# Patient Record
Sex: Female | Born: 1965
Health system: Southern US, Community
[De-identification: ages and names within clinical notes are randomized; demographics above are authoritative.]

## PROBLEM LIST (undated history)

## (undated) DIAGNOSIS — I824Y9 Acute embolism and thrombosis of unspecified deep veins of unspecified proximal lower extremity: Secondary | ICD-10-CM

## (undated) DIAGNOSIS — Z9109 Other allergy status, other than to drugs and biological substances: Secondary | ICD-10-CM

## (undated) DIAGNOSIS — R748 Abnormal levels of other serum enzymes: Secondary | ICD-10-CM

## (undated) DIAGNOSIS — J302 Other seasonal allergic rhinitis: Secondary | ICD-10-CM

## (undated) DIAGNOSIS — M1990 Unspecified osteoarthritis, unspecified site: Secondary | ICD-10-CM

## (undated) DIAGNOSIS — J4540 Moderate persistent asthma, uncomplicated: Secondary | ICD-10-CM

## (undated) DIAGNOSIS — I10 Essential (primary) hypertension: Secondary | ICD-10-CM

## (undated) DIAGNOSIS — E7800 Pure hypercholesterolemia, unspecified: Secondary | ICD-10-CM

## (undated) DIAGNOSIS — N950 Postmenopausal bleeding: Secondary | ICD-10-CM

## (undated) DIAGNOSIS — R740 Nonspecific elevation of levels of transaminase and lactic acid dehydrogenase [LDH]: Secondary | ICD-10-CM

## (undated) DIAGNOSIS — N840 Polyp of corpus uteri: Secondary | ICD-10-CM

## (undated) DIAGNOSIS — Z973 Presence of spectacles and contact lenses: Secondary | ICD-10-CM

## (undated) DIAGNOSIS — K219 Gastro-esophageal reflux disease without esophagitis: Secondary | ICD-10-CM

## (undated) DIAGNOSIS — N9489 Other specified conditions associated with female genital organs and menstrual cycle: Secondary | ICD-10-CM

## (undated) DIAGNOSIS — J309 Allergic rhinitis, unspecified: Secondary | ICD-10-CM

## (undated) DIAGNOSIS — E063 Autoimmune thyroiditis: Secondary | ICD-10-CM

## (undated) DIAGNOSIS — R7303 Prediabetes: Secondary | ICD-10-CM

## (undated) DIAGNOSIS — J4530 Mild persistent asthma, uncomplicated: Secondary | ICD-10-CM

## (undated) DIAGNOSIS — L309 Dermatitis, unspecified: Secondary | ICD-10-CM

## (undated) DIAGNOSIS — E119 Type 2 diabetes mellitus without complications: Secondary | ICD-10-CM

## (undated) DIAGNOSIS — M722 Plantar fascial fibromatosis: Secondary | ICD-10-CM

## (undated) HISTORY — DX: Nonspecific elevation of levels of transaminase and lactic acid dehydrogenase (ldh): R74.0

## (undated) HISTORY — DX: Autoimmune thyroiditis: E06.3

## (undated) HISTORY — DX: Acute embolism and thrombosis of unspecified deep veins of unspecified proximal lower extremity: I82.4Y9

## (undated) HISTORY — DX: Pure hypercholesterolemia, unspecified: E78.00

## (undated) HISTORY — DX: Plantar fascial fibromatosis: M72.2

## (undated) HISTORY — DX: Abnormal levels of other serum enzymes: R74.8

## (undated) HISTORY — PX: HAND SURGERY: SHX662

## (undated) HISTORY — PX: KNEE ARTHROSCOPY: SUR90

## (undated) HISTORY — DX: Essential (primary) hypertension: I10

---

## 1989-06-14 HISTORY — PX: LAPAROSCOPY: SHX197

## 1993-06-14 DIAGNOSIS — Z86718 Personal history of other venous thrombosis and embolism: Secondary | ICD-10-CM

## 1993-06-14 DIAGNOSIS — I824Y9 Acute embolism and thrombosis of unspecified deep veins of unspecified proximal lower extremity: Secondary | ICD-10-CM

## 1993-06-14 HISTORY — DX: Personal history of other venous thrombosis and embolism: Z86.718

## 1993-06-14 HISTORY — DX: Acute embolism and thrombosis of unspecified deep veins of unspecified proximal lower extremity: I82.4Y9

## 1999-10-07 ENCOUNTER — Inpatient Hospital Stay (HOSPITAL_COMMUNITY): Admission: AD | Admit: 1999-10-07 | Discharge: 1999-10-09 | Payer: Self-pay | Admitting: Gynecology

## 2001-04-07 ENCOUNTER — Encounter (INDEPENDENT_AMBULATORY_CARE_PROVIDER_SITE_OTHER): Payer: Self-pay | Admitting: Specialist

## 2001-04-07 ENCOUNTER — Inpatient Hospital Stay (HOSPITAL_COMMUNITY): Admission: AD | Admit: 2001-04-07 | Discharge: 2001-04-09 | Payer: Self-pay | Admitting: Gynecology

## 2001-05-17 ENCOUNTER — Emergency Department (HOSPITAL_COMMUNITY): Admission: EM | Admit: 2001-05-17 | Discharge: 2001-05-18 | Payer: Self-pay | Admitting: Emergency Medicine

## 2001-05-17 ENCOUNTER — Encounter: Payer: Self-pay | Admitting: Emergency Medicine

## 2001-05-18 ENCOUNTER — Encounter: Payer: Self-pay | Admitting: Emergency Medicine

## 2001-05-19 ENCOUNTER — Other Ambulatory Visit: Admission: RE | Admit: 2001-05-19 | Discharge: 2001-05-19 | Payer: Self-pay | Admitting: Gynecology

## 2001-10-04 ENCOUNTER — Other Ambulatory Visit: Admission: RE | Admit: 2001-10-04 | Discharge: 2001-10-04 | Payer: Self-pay | Admitting: Gynecology

## 2002-06-14 HISTORY — PX: SHOULDER ARTHROSCOPY: SHX128

## 2002-08-06 ENCOUNTER — Encounter: Payer: Self-pay | Admitting: Emergency Medicine

## 2002-08-06 ENCOUNTER — Emergency Department (HOSPITAL_COMMUNITY): Admission: EM | Admit: 2002-08-06 | Discharge: 2002-08-07 | Payer: Self-pay | Admitting: Emergency Medicine

## 2002-08-07 ENCOUNTER — Encounter: Payer: Self-pay | Admitting: Emergency Medicine

## 2003-02-19 ENCOUNTER — Other Ambulatory Visit: Admission: RE | Admit: 2003-02-19 | Discharge: 2003-02-19 | Payer: Self-pay | Admitting: Obstetrics and Gynecology

## 2004-03-23 ENCOUNTER — Other Ambulatory Visit: Admission: RE | Admit: 2004-03-23 | Discharge: 2004-03-23 | Payer: Self-pay | Admitting: Obstetrics and Gynecology

## 2004-06-14 HISTORY — PX: HAND SURGERY: SHX662

## 2005-04-29 ENCOUNTER — Ambulatory Visit (HOSPITAL_BASED_OUTPATIENT_CLINIC_OR_DEPARTMENT_OTHER): Admission: RE | Admit: 2005-04-29 | Discharge: 2005-04-29 | Payer: Self-pay | Admitting: Orthopedic Surgery

## 2005-04-29 ENCOUNTER — Encounter (INDEPENDENT_AMBULATORY_CARE_PROVIDER_SITE_OTHER): Payer: Self-pay | Admitting: *Deleted

## 2005-04-29 ENCOUNTER — Ambulatory Visit (HOSPITAL_COMMUNITY): Admission: RE | Admit: 2005-04-29 | Discharge: 2005-04-29 | Payer: Self-pay | Admitting: Orthopedic Surgery

## 2005-05-10 ENCOUNTER — Ambulatory Visit: Payer: Self-pay | Admitting: Infectious Diseases

## 2005-05-11 ENCOUNTER — Ambulatory Visit: Payer: Self-pay | Admitting: Infectious Diseases

## 2005-05-25 ENCOUNTER — Other Ambulatory Visit: Admission: RE | Admit: 2005-05-25 | Discharge: 2005-05-25 | Payer: Self-pay | Admitting: Obstetrics and Gynecology

## 2005-06-02 ENCOUNTER — Ambulatory Visit: Payer: Self-pay | Admitting: Infectious Diseases

## 2005-06-14 DIAGNOSIS — M722 Plantar fascial fibromatosis: Secondary | ICD-10-CM

## 2005-06-14 HISTORY — DX: Plantar fascial fibromatosis: M72.2

## 2005-07-29 ENCOUNTER — Ambulatory Visit: Payer: Self-pay | Admitting: Infectious Diseases

## 2005-10-15 ENCOUNTER — Ambulatory Visit: Payer: Self-pay | Admitting: Infectious Diseases

## 2006-05-27 ENCOUNTER — Other Ambulatory Visit: Admission: RE | Admit: 2006-05-27 | Discharge: 2006-05-27 | Payer: Self-pay | Admitting: Obstetrics and Gynecology

## 2007-07-12 ENCOUNTER — Other Ambulatory Visit: Admission: RE | Admit: 2007-07-12 | Discharge: 2007-07-12 | Payer: Self-pay | Admitting: Obstetrics and Gynecology

## 2008-08-26 ENCOUNTER — Other Ambulatory Visit: Admission: RE | Admit: 2008-08-26 | Discharge: 2008-08-26 | Payer: Self-pay | Admitting: Obstetrics and Gynecology

## 2010-03-22 ENCOUNTER — Emergency Department (HOSPITAL_BASED_OUTPATIENT_CLINIC_OR_DEPARTMENT_OTHER): Admission: EM | Admit: 2010-03-22 | Discharge: 2010-03-23 | Payer: Self-pay | Admitting: Emergency Medicine

## 2010-03-22 ENCOUNTER — Ambulatory Visit: Payer: Self-pay | Admitting: Diagnostic Radiology

## 2010-04-14 HISTORY — PX: KNEE ARTHROSCOPY: SUR90

## 2010-04-27 ENCOUNTER — Ambulatory Visit (HOSPITAL_BASED_OUTPATIENT_CLINIC_OR_DEPARTMENT_OTHER): Admission: RE | Admit: 2010-04-27 | Discharge: 2010-04-27 | Payer: Self-pay | Admitting: Specialist

## 2010-08-25 LAB — POCT HEMOGLOBIN-HEMACUE: Hemoglobin: 14 g/dL (ref 12.0–15.0)

## 2010-10-30 NOTE — Op Note (Signed)
NAMEBRIHANA, Monica Good             ACCOUNT NO.:  [id number]   MEDICAL RECORD NO.:  [id number]          PATIENT TYPE:  AMB   LOCATION:  [place]                          FACILITY:  [facility]   PHYSICIAN:  Youlanda Mighty. Sypher, M.D. DATE OF BIRTH:  1965/08/20   DATE OF PROCEDURE:  04/29/2005  DATE OF DISCHARGE:                                 OPERATIVE REPORT   PREOPERATIVE DIAGNOSIS:  Probable granulomatous lymphangitis, right dorsal  hand, wrist and forearm, due to presumed to fungal or atypical mycobacterial  infection, with development of dorsal forearm abscess.   POSTOPERATIVE DIAGNOSIS:  Probable granulomatous lymphangitis, right dorsal  hand, wrist and forearm, due to presumed to fungal or atypical mycobacterial  infection, with development of dorsal forearm abscess.   OPERATION:  Resection of granulomatous lymphangitis and incision and  drainage of dorsal forearm abscess with aerobic and anaerobic cultures and  tissue culture for acid-fast bacilli and fungal evaluation and possible  Sporotrichosis culture.   OPERATIONS:  Youlanda Mighty. Sypher, M.D.   ASSISTANT:  Herbie Baltimore Dasnoit PA-C   ANESTHESIA:  His axillary block, supervising anesthesiologist is Dr.  Annye Asa.   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  Carol Ada for evaluation and management of an atypical right arm pain  and swelling phenomenon.   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   During the past several weeks she had noted swelling and a nodular  appearance and feel of the dorsum of her hand and wrist region.  She  developed am area of rubor on the dorsal aspect of her forearm consistent  with a possible abscess.  On palpation this abscess was cold.  She has seen  a number of physicians and was referred for an upper extremity orthopedic  consult.   Clinical examination suggested a possible granulomatous  lymphangitis with  abscess formation.   I contacted Alison Murray, M.D., of the infectious disease service and  discussed the current management strategies for sporotrichosis or atypical  mycobacteria.   We concluded that a tissue biopsy was mandatory to obtain an accurate  organism identification and sensitivities for proper chemotherapy.   After informed consent, Monica Good is brought to the operating at this  time anticipating an excisional biopsy of her atypical lymphangitis and an  incision and drainage of her obvious abscess for culture, sensitivities,  tissue biopsy and tissue for smears to try to identify her offending  organism.   After informed consent, she is brought to the operating room at this time.   PROCEDURE:  Monica Good is brought to the operating room and placed in  supine position on the operating table.   Following an anesthesia consultation with Dr. Glennon Mac, she elected to have  regional anesthesia in the form of an axillary block.   Following completion of block, she had excellent anesthesia of the right  arm.  She was subsequently transferred to the operating room and placed in  supine position on the operating table.   Following light sedation, the right arm was  prepped with Betadine soap and  solution and sterilely draped.   A pneumatic tourniquet was applied to the proximal brachium, followed by  elevation and exsanguination of the limb with an Esmarch bandage.  The  arterial tourniquet was inflated to 240 mmHg.   The procedure commenced with a 3-cm incision directly over the palpably  enlarged lymphatic chain.   The subcutaneous tissues were carefully divided, revealing a rather unusual  seropurulent material and an obviously inflamed and granulomatous-appearing  lymphatic chain that looked not unlike a string of pearls, approximately 4-5  mm in diameter.   The lymphatic tissues over the dorsum of the ring finger metacarpal were   harvested over a distance of 3.5 cm.   The lymphatic chain was suture ligated distally to prevent a collection of a  lymphocele postoperatively.   The segment was removed and a portion placed in formalin for histopathologic  evaluation and the rest placed in saline and sent directly to the lab for  fungal and AFB identification and culture with sensitivities.   Attention was then directed to the dorsal forearm.   A small elliptical fragment of skin was harvested through a transverse  incision to use as a full-thickness skin biopsy, also to send for smear and  histologic evaluation in an effort to obtain an organism.   The abscess was drained by blunt dissection, followed by packing with  Iodoform gauze.   For aftercare Monica Good will be placed on doxycycline 100 mg p.o. b.i.d.  pending our smear evaluation.   Once we are provided adequate information to understand the nature of her  infection, we will speak with the infectious disease service and begin  proper chemotherapy.   The wounds were irrigated with sterile saline followed by repair of the hand  biopsy wound with intradermal 3-0 Prolene and steri-stripped.  A compressive  dressing was applied with volar plaster splint maintaining the wrist in 5  degrees of dorsiflexion.      Youlanda Mighty Sypher, M.D.  Electronically Signed     RVS/MEDQ  D:  04/29/2005  T:  04/30/2005  Job:  [id number]   cc:   Alison Murray, M.D.  Fax: [phone]   Judithann Sauger, M.D.  Fax: [phone]

## 2010-10-30 NOTE — Discharge Summary (Signed)
[place]  Patient:    Monica Good, Monica Good Visit Number: [id number] MRN: [id number]          Service Type: OBS Location: 910A 9142 01 Attending Physician:  Tereso Newcomer Dictated by:   Karna Christmas, [place] Date:  04/07/2001 Discharge Date: 04/09/2001                             Discharge Summary  DISCHARGE DIAGNOSES:          1. Intrauterine pregnancy at term.                               2. Prior history of deep vein thrombosis.  PROCEDURES:                   1. Induction of labor.                               2. Normal spontaneous vaginal delivery of viable                                  infant.  HISTORY OF PRESENT ILLNESS:   The patient is a 45 year old gravida 4, para 3-0-0-3, with an LMP of July 14, 2000, [facility] April 20, 2001.  Prenatal risk factors include a history of venous thrombosis while on OCPs.  The patient was anticoagulated with Lovenox with her previous pregnancy, and with the current pregnancy she was placed on 30 mg SQ q.d.  PRENATAL LABORATORY DATA:     Blood type O positive, antibody screen negative. RPR, HBsAg, HIV nonreactive.  Amniocentesis and amniotic AFP normal.  HOSPITAL COURSE:              The patient was admitted for induction of labor at 42 weeks on April 07, 2001.  She did stop her Lovenox on April 05, 2001.  She did progress rapidly to complete dilatation and delivered an Apgars of 9 and 48 female infant, weight 8 pounds 4 ounces, over an intact perineum. Postpartum course was normal.  She remained afebrile, had no difficulty voiding.  Was able to be discharged on her second postpartum day.  She did reinstitute the Lovenox 30 mg q.d. postpartum.  CBC:  Hematocrit 32.9, hemoglobin 11.6, WBC 12.8, platelets 202.  FOLLOW-UP:                    Follow up in six weeks.  MEDICATIONS:                  Continue with prenatal vitamins and iron. Motrin and Tylox for pain. Dictated by:    Karna Christmas, [facility] Attending Physician:  Tereso Newcomer DD:  04/21/01 TD:  04/23/01 Job: [id number]

## 2011-09-23 IMAGING — CR DG KNEE COMPLETE 4+V*L*
4 series · 4 of 4 positions shown · non-contrast
Comparison: None.

CLINICAL DATA: Knee pain for 2 weeks with difficulty walking.

LEFT KNEE - COMPLETE 4+ VIEW

[t knee ap left]
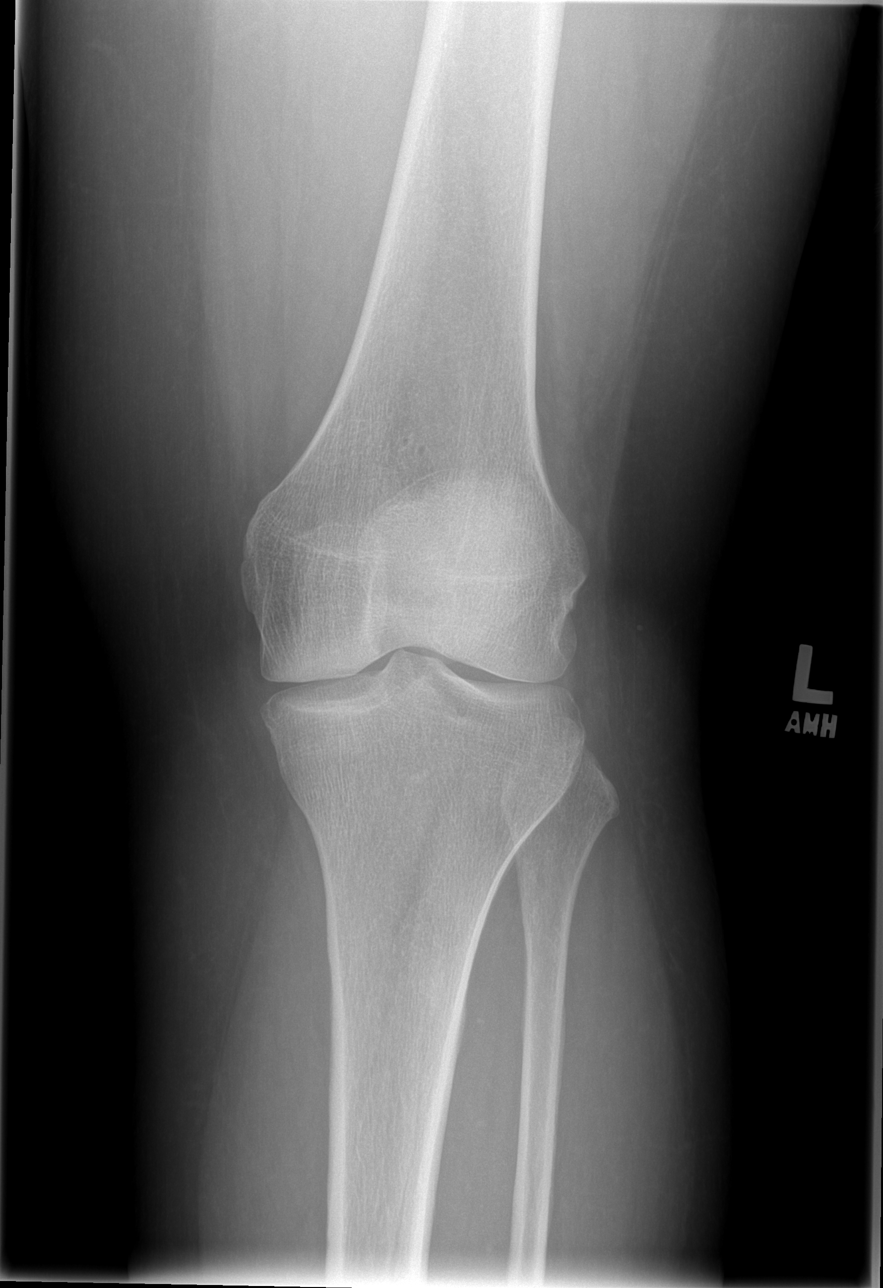

[t knee oblique left (1 of 2)]
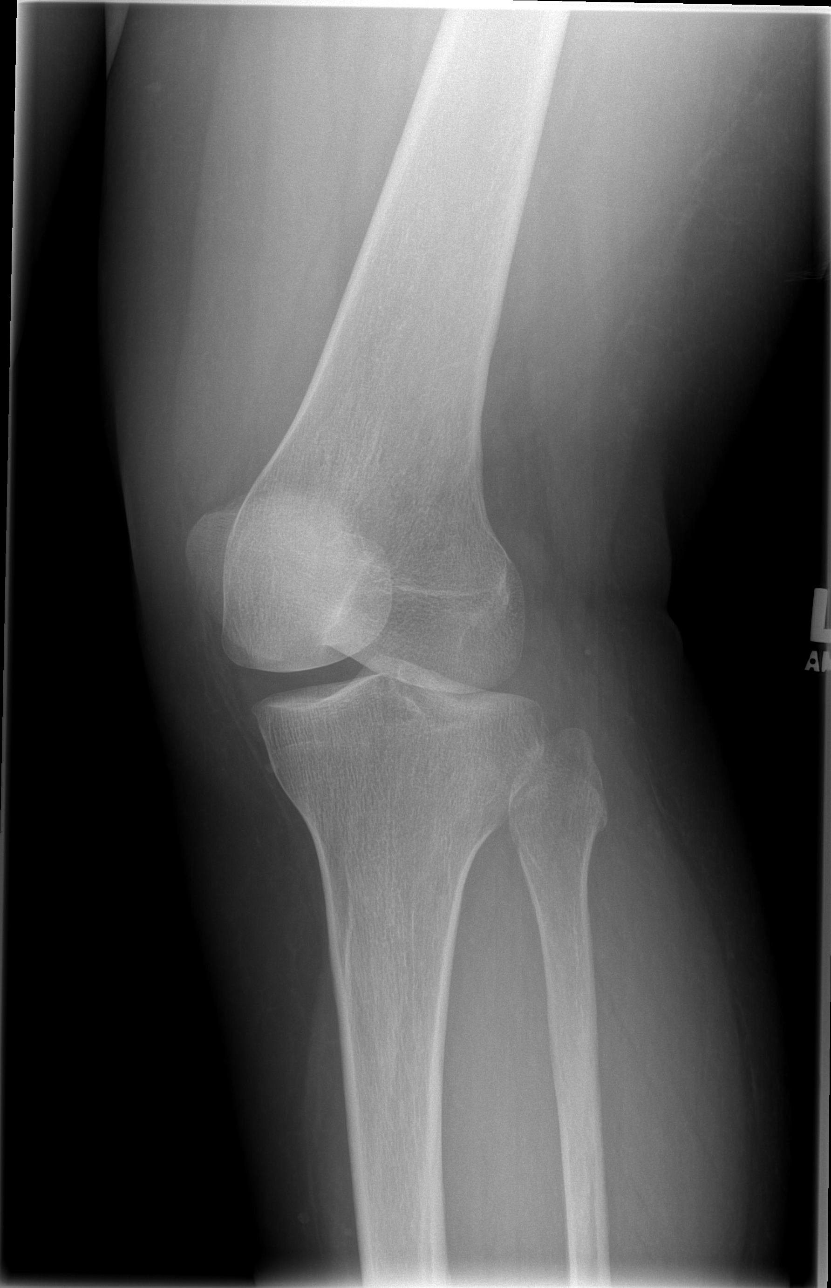

[t knee oblique left (2 of 2)]
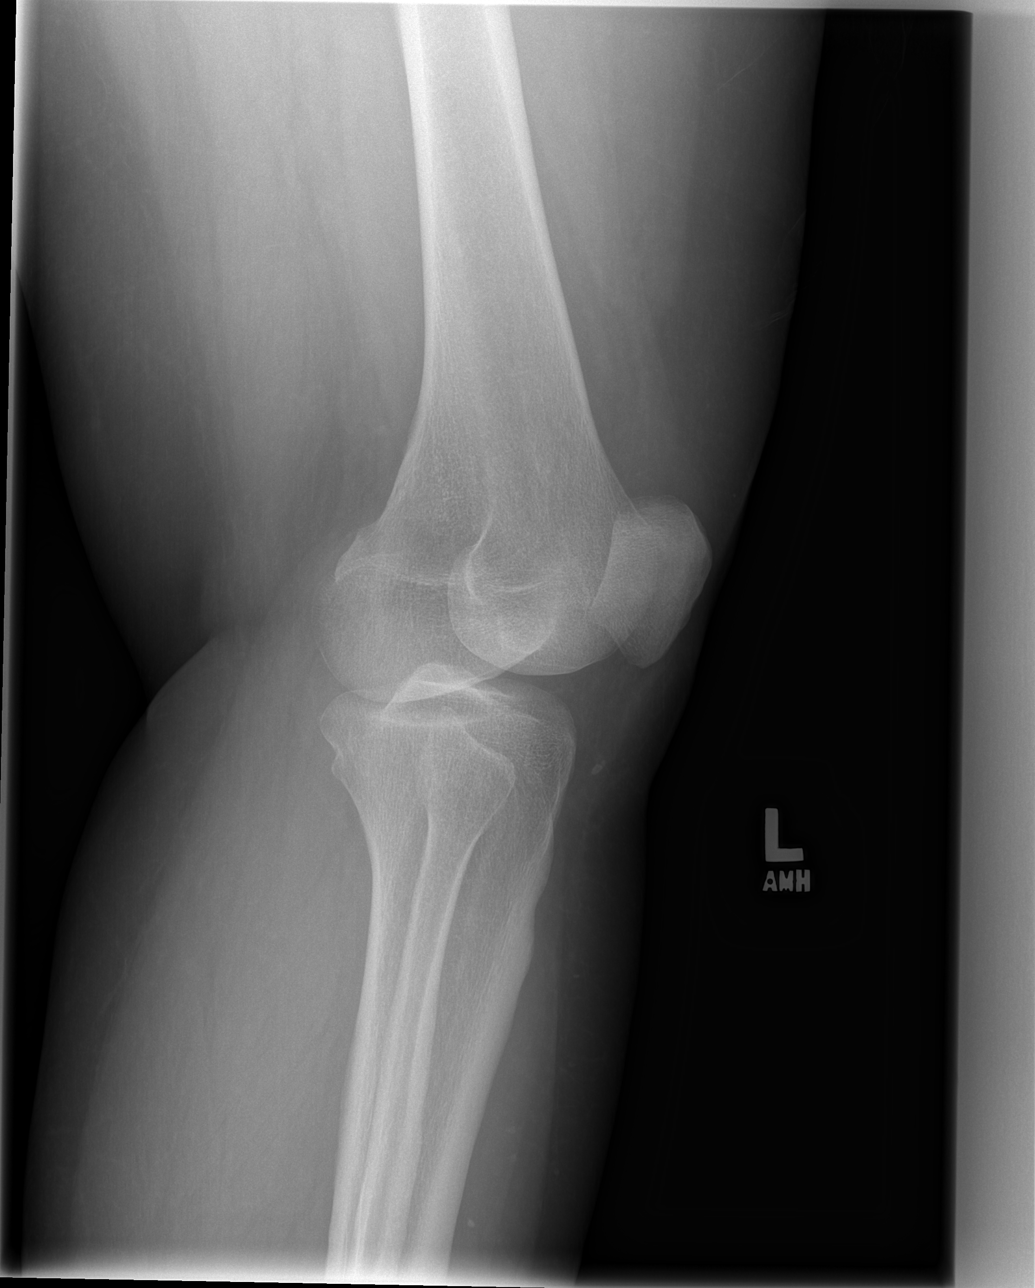

[t knee lat left]
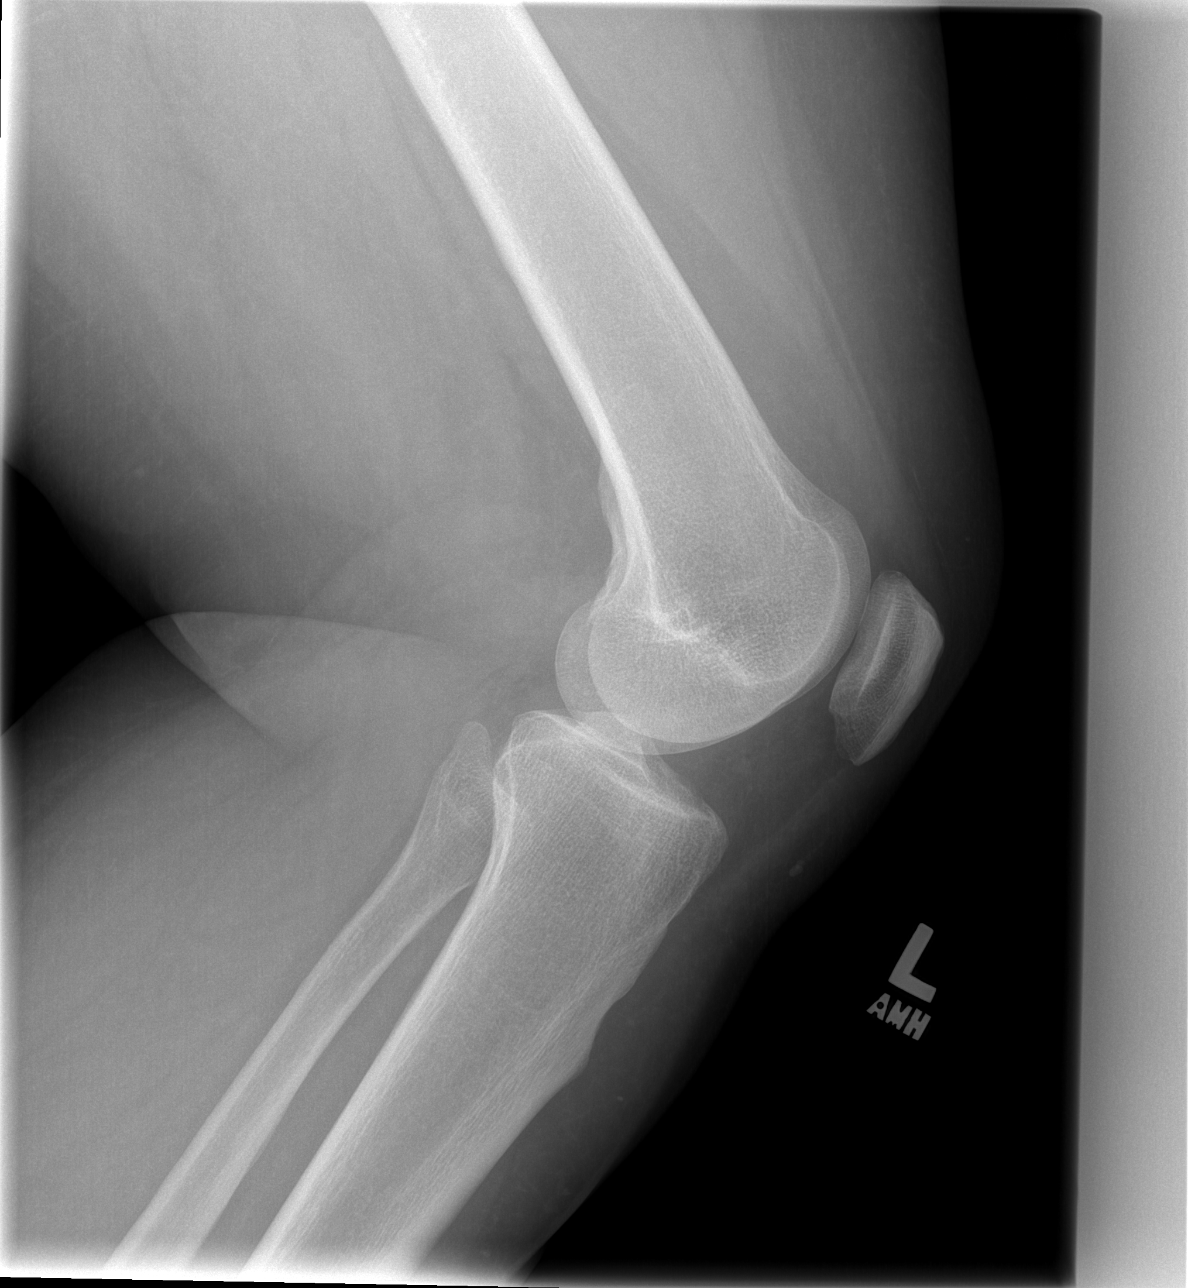

[4 of 4 positions shown; findings below may reference images not displayed]

FINDINGS: The mineralization and alignment are normal.  There is no
evidence of acute fracture or dislocation.  No soft tissue
abnormalities are identified. There is no significant knee joint
effusion.
IMPRESSION: No acute osseous findings.

## 2011-11-01 ENCOUNTER — Telehealth: Payer: Self-pay | Admitting: Hematology & Oncology

## 2011-11-01 NOTE — Telephone Encounter (Signed)
Talked with Amy at referring MD office. She is aware we need labs,scans and anything related to pt's care faxed to [place].

## 2011-11-03 ENCOUNTER — Telehealth: Payer: Self-pay | Admitting: Hematology & Oncology

## 2011-11-03 NOTE — Telephone Encounter (Signed)
Left pt message to call and schedule appointment

## 2011-11-05 ENCOUNTER — Telehealth: Payer: Self-pay | Admitting: Hematology & Oncology

## 2011-11-05 NOTE — Telephone Encounter (Signed)
Patient aware of 12-01-11

## 2011-12-01 ENCOUNTER — Ambulatory Visit (HOSPITAL_BASED_OUTPATIENT_CLINIC_OR_DEPARTMENT_OTHER): Payer: BC Managed Care – PPO | Admitting: Hematology & Oncology

## 2011-12-01 ENCOUNTER — Other Ambulatory Visit: Payer: BC Managed Care – PPO | Admitting: Lab

## 2011-12-01 ENCOUNTER — Ambulatory Visit: Payer: BC Managed Care – PPO

## 2011-12-01 VITALS — BP 148/80 | HR 70 | Temp 97.2°F | Ht 67.0 in | Wt 272.0 lb

## 2011-12-01 DIAGNOSIS — I82409 Acute embolism and thrombosis of unspecified deep veins of unspecified lower extremity: Secondary | ICD-10-CM

## 2011-12-01 DIAGNOSIS — Z86718 Personal history of other venous thrombosis and embolism: Secondary | ICD-10-CM

## 2011-12-01 LAB — CBC WITH DIFFERENTIAL (CANCER CENTER ONLY)
BASO#: 0 10*3/uL (ref 0.0–0.2)
BASO%: 0.6 % (ref 0.0–2.0)
EOS%: 4.4 % (ref 0.0–7.0)
Eosinophils Absolute: 0.3 10*3/uL (ref 0.0–0.5)
HCT: 39.5 % (ref 34.8–46.6)
HGB: 13.7 g/dL (ref 11.6–15.9)
LYMPH#: 1.8 10*3/uL (ref 0.9–3.3)
LYMPH%: 26.8 % (ref 14.0–48.0)
MCH: 31.1 pg (ref 26.0–34.0)
MCHC: 34.7 g/dL (ref 32.0–36.0)
MCV: 90 fL (ref 81–101)
MONO#: 0.7 10*3/uL (ref 0.1–0.9)
MONO%: 10.9 % (ref 0.0–13.0)
NEUT#: 3.9 10*3/uL (ref 1.5–6.5)
NEUT%: 57.3 % (ref 39.6–80.0)
Platelets: 265 10*3/uL (ref 145–400)
RBC: 4.41 10*6/uL (ref 3.70–5.32)
RDW: 12.3 % (ref 11.1–15.7)
WBC: 6.8 10*3/uL (ref 3.9–10.0)

## 2011-12-01 LAB — CHCC SATELLITE - SMEAR

## 2011-12-01 NOTE — Progress Notes (Signed)
CC:   Cynthia P. Romine, M.D.  DIAGNOSIS: 1. History of deep venous thrombosis of the left thigh. 2. History of miscarriages.  HISTORY OF PRESENT ILLNESS:  Monica Good is a very charming 46 year old [place] female.  She and her husband moved to the [place] in 1995. She subsequently had a had DVT after moving here.  At that time, she was on oral contraceptives.  This apparently was a [place] "formulation" that had been causing troubles and subsequent losses in [place].  She was placed on anticoagulation for 1 year.  Of note, she had 3 pregnancies after her DVT.  She was on heparin/low molecular weight heparin for each pregnancy.  She got through each pregnancy without difficulties.  Her 1st pregnancy was actually in 1992.  Prior to this, she had had miscarriages.  She said that she had "blocked tubes."  These seem to be preventing her from carrying a child.  She did, however, have 4 children.  She has 3 daughters and 1 son.  She is concerned about any familial thrombophilic state such that her daughters should not be on oral contraceptives.  She does not smoke.  She has no diabetes.  She tries to stay active.  She says that she does not have any issues with her left leg.  There is no swelling.  She has occasional pain in the upper inner thigh of the left leg.  This, however, is infrequent.  She has had no weight loss or weight gain.  There has been no change in bowel or bladder habits.  She has had no cough.  She has had no headache or blurred vision.  She wanted to make sure that there was no risk of her having a thrombophilic state that she could pass on to her children.  As such, Dr. Joan Flores kindly referred her to the [facility] for an evaluation.  PAST MEDICAL HISTORY: 1. Remarkable for a history of asthma. 2. Left knee surgery for torn meniscus. 3. Right shoulder surgery.  ALLERGIES:  To no medications.  MEDICATIONS: 1. Aspirin 81 mg p.o.  daily. 2. Mirena IUD. 3. Singulair 10 mg p.o. q.h.s. 4. Proventil inhaler 2 puffs q.6 hours p.r.n.  SOCIAL HISTORY:  Negative for tobacco use.  She has rare alcohol use. She works for [employer].  She has no obvious occupational exposures.  FAMILY HISTORY:  Negative for any history of blood clot in the family. There is no history of malignancy in the family.  REVIEW OF SYSTEMS:  As stated in history of present illness.  No additional findings noted on a 12-system review.  PHYSICAL EXAMINATION:  This is a well-developed, well-nourished Caucasian female in no obvious distress.  Vital signs:  Temperature of 97.2, pulse 70, respiratory rate 20, blood pressure 148/80.  Weight is 272.  Head and neck:  Shows a normocephalic, atraumatic skull.  There are no ocular or oral lesions.  There are no palpable cervical or supraclavicular lymph nodes.  Lungs:  Clear to percussion and auscultation bilaterally.  Cardiac:  Regular rate and rhythm with a normal S1 and S2.  There are no murmurs, rubs or bruits.  Abdomen:  Soft with Good bowel sounds.  There is no palpable abdominal mass.  There is no fluid wave.  There is no palpable hepatosplenomegaly.  Back:  No tenderness over the spine, ribs, or hips.  Extremities:  Shows no clubbing, cyanosis or edema.  She has laparoscopy scars in the left knee.  She has a  negative Homans sign bilaterally.  She has no palpable venous cord in her lower legs.  She has Good range of motion of her joints.  Skin:  Shows no rashes, ecchymoses or petechia.  LABORATORY STUDIES:  White cell count is 6.8, hemoglobin 13.7, hematocrit 39.5 and platelet count 265.  Peripheral smear shows a normochromic, normocytic population of red blood cells.  I see no nucleated red blood cells.  There is no rouleaux formation.  I see no target cells.  She has no teardrop cells.  Platelets are adequate in number and size.  There are no hypersegmented polys.  I see no  immature myeloid or lymphoid forms.  There are no blasts.  IMPRESSION:  Ms. Burlison is a very charming 46 year old [place] female. She had a single episode of deep venous thrombosis back in 1995.  She was on an oral contraceptive at that time.  She did have some miscarriages previously to 1995.  It is hard to say whether or not there an underlying hypercoagulable state.  We did sent off the routine hypercoagulable panel studies.  I think it is "50-50" as to whether or not there is a problem.  Of note, I also sent off a homocysteine level.  I also sent off the MTHFR analysis.  Obviously, if Ms. Rotondo has an underlying hypercoagulable state, we would have to be careful with her on any kind of post-menopausal estrogens.  We may want to consider one of the estrogen patches which do appear to be safer.  I spent a Good hour or so with Ms. Mroczkowski.  I told that we would not have to do anything with her daughters unless she was found to have one of the hereditary thrombophilic states.  I do not see any underlying myeloproliferative condition that could have led to a thromboembolic event.  As such, I do not think we need to send off a JAK2 assay.  She is on aspirin already.  I think this is very reasonable for her.  We will call Ms. Kunzman with results of her labs.  I do not think we have to get her back to the office even though she is fun to talk to.    ______________________________ Volanda Napoleon, M.D. PRE/MEDQ  D:  12/01/2011  T:  12/01/2011  Job:  [id number]

## 2011-12-01 NOTE — Progress Notes (Signed)
This office note has been dictated.

## 2011-12-02 ENCOUNTER — Other Ambulatory Visit: Payer: Self-pay

## 2011-12-03 LAB — MTHFR DNA ANALYSIS

## 2011-12-06 LAB — HYPERCOAGULABLE PANEL, COMPREHENSIVE
AntiThromb III Func: 97 % (ref 76–126)
Anticardiolipin IgA: 4 APL U/mL (ref <22)
Anticardiolipin IgG: 3 GPL U/mL (ref <23)
Anticardiolipin IgM: 9 MPL U/mL (ref <11)
Beta-2 Glyco I IgG: 0 G Units (ref <20)
Beta-2-Glycoprotein I IgA: 12 A Units (ref <20)
Beta-2-Glycoprotein I IgM: 3 M Units (ref <20)
DRVVT: 43.8 secs (ref <45.1)
Lupus Anticoagulant: NOT DETECTED
PTT Lupus Anticoagulant: 34.7 secs (ref 28.0–43.0)
Protein C Activity: 200 % — ABNORMAL HIGH (ref 75–133)
Protein C, Total: 142 % (ref 72–160)
Protein S Activity: 151 % — ABNORMAL HIGH (ref 69–129)
Protein S Total: 107 % (ref 60–150)

## 2012-10-24 ENCOUNTER — Encounter: Payer: Self-pay | Admitting: Obstetrics and Gynecology

## 2012-10-25 ENCOUNTER — Ambulatory Visit (INDEPENDENT_AMBULATORY_CARE_PROVIDER_SITE_OTHER): Payer: BC Managed Care – PPO | Admitting: Obstetrics and Gynecology

## 2012-10-25 ENCOUNTER — Encounter: Payer: Self-pay | Admitting: Obstetrics and Gynecology

## 2012-10-25 VITALS — BP 140/82 | Ht 68.0 in | Wt 262.0 lb

## 2012-10-25 DIAGNOSIS — Z01419 Encounter for gynecological examination (general) (routine) without abnormal findings: Secondary | ICD-10-CM

## 2012-10-25 NOTE — Progress Notes (Signed)
47 y.o.   Married    Caucasian   female   [phone]   here for annual exam. Bad year for her asthma.  Having spotting on and off with IUD, currently ongoing for 4 weeks.  Frustrating, and difficult on sex life.  Saw Dr. Marin Olp and had hypercoag w/u and they told her her labs were fine.   Cant feel IUD string. At her post IUD placement check, I could not see strings, and I probed the canal with a Claiborne Billings and found the strings and pulled them down outside the os.  Pt has never been able to feel her strings ever since insertion in June 2013.     Patient's last menstrual period was 09/30/2012.          Sexually active: yes  The current method of family planning is vasectomy and IUD.    Exercising: zumba 3 days a week Last mammogram:  06/13/12 neg Last pap smear:09/03/09 neg History of abnormal pap: no Smoking:never Alcohol:1-2 glasses of wine a month(German white wine) Last colonoscopy:never Last Bone Density: never  Last tetanus shot:2008 Last cholesterol check: 01/2012 at work   Total 190  Hgb: declined               Urine:pcp   History reviewed. No pertinent family history.  There are no active problems to display for this patient.   Past Medical History  Diagnosis Date  . Thigh DVT (deep venous thrombosis) 1995  . Plantar fasciitis 2007  . Hypertension     asthma related no medication taken    Past Surgical History  Procedure Laterality Date  . Laparoscopy  1991  . Shoulder arthroscopy  2004  . Hand surgery Right 2006  . Knee arthroscopy Left 04/2010    Allergies: Latex  Current Outpatient Prescriptions  Medication Sig Dispense Refill  . ASMANEX 60 METERED DOSES 220 MCG/INH inhaler       . aspirin 81 MG tablet Take 81 mg by mouth daily. TAKES @@ PILLS ONCE DAILY      . Calcium Carbonate Antacid (TUMS PO) Take by mouth daily.       . Levonorgestrel (MIRENA IU) by Intrauterine route. IUD      . montelukast (SINGULAIR) 10 MG tablet as needed.       Marland Kitchen PROVENTIL HFA 108 (90  BASE) MCG/ACT inhaler as needed.       Marland Kitchen EPIPEN 2-PAK 0.3 MG/0.3ML SOAJ        No current facility-administered medications for this visit.    ROS: Pertinent items are noted in HPI.  Social Hx: Married, 4 children, three daughters and one son,  works as a Scientist, forensic.  Oldest son is 97 yo.  Oldest daughter is 66 and wants to be a Emergency planning/management officer.  The two younger girls are tomboys.   Exam:    BP 140/82  Ht 5' 8"  (1.727 m)  Wt 262 lb (118.842 kg)  BMI 39.85 kg/m2  LMP 09/30/2012  Weight down 7 pounds from last yr.  Ht stable. Wt Readings from Last 3 Encounters:  10/25/12 262 lb (118.842 kg)  12/01/11 272 lb (123.378 kg)     Ht Readings from Last 3 Encounters:  10/25/12 5' 8"  (1.727 m)  12/01/11 5' 7"  (1.702 m)    General appearance: alert, cooperative and appears stated age Head: Normocephalic, without obvious abnormality, atraumatic Neck: no adenopathy, supple, symmetrical, trachea midline and thyroid not enlarged, symmetric, no tenderness/mass/nodules Lungs: clear to auscultation bilaterally Breasts: Inspection  negative, No nipple retraction or dimpling, No nipple discharge or bleeding, No axillary or supraclavicular adenopathy, Normal to palpation without dominant masses Heart: regular rate and rhythm Abdomen: soft, non-tender; bowel sounds normal; no masses,  no organomegaly Extremities: extremities normal, atraumatic, no cyanosis or edema Skin: Skin color, texture, turgor normal. No rashes or lesions Lymph nodes: Cervical, supraclavicular, and axillary nodes normal. No abnormal inguinal nodes palpated Neurologic: Grossly normal   Pelvic: External genitalia:  no lesions              Urethra:  normal appearing urethra with no masses, tenderness or lesions              Bartholins and Skenes: normal                 Vagina: normal appearing vagina with normal color and discharge, no lesions              Cervix: normal appearance, cannot see strings.  Probed canal with a  cytobrush but could not find strings.              Pap taken: yes        Bimanual Exam:  Uterus:  uterus is normal size, shape, consistency and nontender.  Mid, mobile, upper limits of nl size.                                      Adnexa: normal adnexa in size, nontender and no masses                                      Rectovaginal: Confirms                                      Anus:  normal sphincter tone, no lesions  A: normal gyn exam, Mirena inserted 6.7.13 to help with menorrhagia. Cannot see IUD strings.   Husband has vasectomy.  Since pt does not need IUD for birth control, we discussed the option to not go searching for the IUD with USG since it seems to be working to control her menorrhagia and she is not having any sx's of uterine perforation.  Discussed the possibilities of IUD expulsion and uterine perforation and sequelae, and pt feels comfortable just leaving things as is.  It is in all likelihood still inside the uterus. Regarding the spotting she is having, I hesitate to add E2 since she has had a DVT in the past.  I rec: she try NSAID's while she is having spotting to see if that helps.  She does have long periods of amenorrhea in addition to these episodes of spotting.      H/o DVT while on OC's.  Saw Ennever for W/U and told all was nl and no cause found for her DVT other than OC's.     Asthma, difficult to control this season     P: mammogram pap smear counseled on breast self exam, mammography screening, adequate intake of calcium and vitamin D, diet and exercise return annually or prn     An After Visit Summary was printed and given to the patient.

## 2012-10-25 NOTE — Patient Instructions (Addendum)
Try taking one aleve twice a day Or two advil three times a day while you are having spotting to see if that helps.    EXERCISE AND DIET:  We recommended that you start or continue a regular exercise program for good health. Regular exercise means any activity that makes your heart beat faster and makes you sweat.  We recommend exercising at least 30 minutes per day at least 3 days a week, preferably 4 or 5.  We also recommend a diet low in fat and sugar.  Inactivity, poor dietary choices and obesity can cause diabetes, heart attack, stroke, and kidney damage, among others.    ALCOHOL AND SMOKING:  Women should limit their alcohol intake to no more than 7 drinks/beers/glasses of wine (combined, not each!) per week. Moderation of alcohol intake to this level decreases your risk of breast cancer and liver damage. And of course, no recreational drugs are part of a healthy lifestyle.  And absolutely no smoking or even second hand smoke. Most people know smoking can cause heart and lung diseases, but did you know it also contributes to weakening of your bones? Aging of your skin?  Yellowing of your teeth and nails?  CALCIUM AND VITAMIN D:  Adequate intake of calcium and Vitamin D are recommended.  The recommendations for exact amounts of these supplements seem to change often, but generally speaking 600 mg of calcium (either carbonate or citrate) and 800 units of Vitamin D per day seems prudent. Certain women may benefit from higher intake of Vitamin D.  If you are among these women, your doctor will have told you during your visit.    PAP SMEARS:  Pap smears, to check for cervical cancer or precancers,  have traditionally been done yearly, although recent scientific advances have shown that most women can have pap smears less often.  However, every woman still should have a physical exam from her gynecologist every year. It will include a breast check, inspection of the vulva and vagina to check for abnormal  growths or skin changes, a visual exam of the cervix, and then an exam to evaluate the size and shape of the uterus and ovaries.  And after 49 years of age, a rectal exam is indicated to check for rectal cancers. We will also provide age appropriate advice regarding health maintenance, like when you should have certain vaccines, screening for sexually transmitted diseases, bone density testing, colonoscopy, mammograms, etc.   MAMMOGRAMS:  All women over 11 years old should have a yearly mammogram. Many facilities now offer a "3D" mammogram, which may cost around $50 extra out of pocket. If possible,  we recommend you accept the option to have the 3D mammogram performed.  It both reduces the number of women who will be called back for extra views which then turn out to be normal, and it is better than the routine mammogram at detecting truly abnormal areas.    COLONOSCOPY:  Colonoscopy to screen for colon cancer is recommended for all women at age 43.  We know, you hate the idea of the prep.  We agree, BUT, having colon cancer and not knowing it is worse!!  Colon cancer so often starts as a polyp that can be seen and removed at colonscopy, which can quite literally save your life!  And if your first colonoscopy is normal and you have no family history of colon cancer, most women don't have to have it again for 10 years.  Once every ten years,  you can do something that may end up saving your life, right?  We will be happy to help you get it scheduled when you are ready.  Be sure to check your insurance coverage so you understand how much it will cost.  It may be covered as a preventative service at no cost, but you should check your particular policy.

## 2012-10-30 LAB — IPS PAP TEST WITH HPV

## 2013-03-28 ENCOUNTER — Ambulatory Visit (INDEPENDENT_AMBULATORY_CARE_PROVIDER_SITE_OTHER): Payer: BC Managed Care – PPO | Admitting: Podiatry

## 2013-03-28 ENCOUNTER — Ambulatory Visit (INDEPENDENT_AMBULATORY_CARE_PROVIDER_SITE_OTHER): Payer: BC Managed Care – PPO

## 2013-03-28 ENCOUNTER — Encounter: Payer: Self-pay | Admitting: Podiatry

## 2013-03-28 VITALS — BP 144/77 | HR 71 | Resp 16 | Ht 68.0 in | Wt 260.0 lb

## 2013-03-28 DIAGNOSIS — B353 Tinea pedis: Secondary | ICD-10-CM

## 2013-03-28 DIAGNOSIS — R52 Pain, unspecified: Secondary | ICD-10-CM

## 2013-03-28 DIAGNOSIS — M779 Enthesopathy, unspecified: Secondary | ICD-10-CM

## 2013-03-28 MED ORDER — ECONAZOLE NITRATE 1 % EX CREA: TOPICAL_CREAM | Freq: Every day | CUTANEOUS | Status: DC

## 2013-03-28 NOTE — Progress Notes (Deleted)
Review of Systems - {ros master:[id number]}

## 2013-03-28 NOTE — Patient Instructions (Signed)
Apply topical antifungal cream to the foot daily to the affected area for 30 days.

## 2013-03-28 NOTE — Progress Notes (Signed)
Patient ID: IMO CUMBIE, female   DOB: 02/08/1966, 47 y.o.   MRN: [id number]  This patient presents today complaining of 3 month history of pain in the dorsolateral aspect of left foot activated with weightbearing relieved with rest. There is no history of direct injury to the foot.  She is also complaining of itching in the fourth left intermetatarsal space over the last several weeks treated with Neosporin ointment without any improvement.  Medical history: Includes asthma eczema and hypertension, DVT, plantar fasciitis.  Current medications see attached list.  Examination:  Vascular: The DP and PT pulses are two over four bilaterally. Capillary fill is immediate bilaterally. The feet are warm to touch bilaterally.  Neurological: Knee and ankle reflexes are equal and reactive bilaterally.  Dermatological: The fourth left web space demonstrates scaling erythema without drainage and a small blister is noted.  Musculoskeletal: There is no restriction and ankle subtalar midtarsal joints bilaterally. There are no areas of palpable tenderness in the left foot.  X-ray report see attached report  Assessment: Low-grade arthralgia left foot undetermined origin. Tenia pedis fourth left web space.  Plan: Patient is advised to wear a sturdy athletically sept style shoes at all times until the left leg pain is resolved.Econazole 1% cream prescribed to apply to the fourth left web space daily x30 days. Reappoint at patient's request if the symptoms do not improve.  Guenther Dunshee C.Amalia Hailey, DPM

## 2013-09-17 ENCOUNTER — Other Ambulatory Visit: Payer: Self-pay | Admitting: Gastroenterology

## 2013-10-29 ENCOUNTER — Telehealth: Payer: Self-pay | Admitting: Obstetrics and Gynecology

## 2013-10-29 NOTE — Telephone Encounter (Signed)
Confirming pts appt

## 2013-11-02 ENCOUNTER — Encounter: Payer: Self-pay | Admitting: Obstetrics and Gynecology

## 2013-11-02 ENCOUNTER — Ambulatory Visit: Payer: BC Managed Care – PPO | Admitting: Obstetrics and Gynecology

## 2013-11-02 ENCOUNTER — Ambulatory Visit (INDEPENDENT_AMBULATORY_CARE_PROVIDER_SITE_OTHER): Payer: BC Managed Care – PPO | Admitting: Obstetrics and Gynecology

## 2013-11-02 VITALS — BP 140/80 | HR 70 | Ht 67.5 in | Wt 281.0 lb

## 2013-11-02 DIAGNOSIS — N8111 Cystocele, midline: Secondary | ICD-10-CM

## 2013-11-02 DIAGNOSIS — IMO0002 Reserved for concepts with insufficient information to code with codable children: Secondary | ICD-10-CM

## 2013-11-02 NOTE — Progress Notes (Signed)
Patient ID: Monica Good, female   DOB: [birth year], 48 y.o.   MRN: [id number] GYNECOLOGY VISIT  PCP:   Hal Hope Smith,MD  Referring provider:   HPI: 48 y.o.   Married  Caucasian  female   [phone] with Patient's last menstrual period was 10/29/2013.   here for  AEX.  Mirena IUD inserted 11/19/11 for menorrhagia and adenomyosis. Happy with IUD.  Had some bleeding this week.  Prior menses was 05/2013. Difficult to see strings.  Declines ultrasound.   Some hot flashes.    Some urinary leakage with coughing.  Coughing due to pollen.   Has a history of a left lower extremity DVT while on combined birth control pill from [place].   Married for 28 years.  From [place], [place].   Largest child was 8#7oz.  Hgb:  PCP Urine:  PCP  GYNECOLOGIC HISTORY: Patient's last menstrual period was 10/29/2013. Sexually active:  yes Partner preference: female Contraception:  Vasectomy/Mirena Menopausal hormone therapy: n/a DES exposure:   no Blood transfusions:   no Sexually transmitted diseases:   no GYN procedures and prior surgeries:  no Last mammogram:  1/2-15 QQP:[id number]               Last pap and high risk HPV testing:   10-25-12 wnl:neg HR HPV. History of abnormal pap smear:  no   OB History   Grav Para Term Preterm Abortions TAB SAB Ect Mult Living   4 4 4       4        LIFESTYLE: Exercise:   no            Tobacco:   no Alcohol:     1 drink per week Drug use:  no  OTHER HEALTH MAINTENANCE: Tetanus/TDap:  Up to date with PCP Gardisil:               n/a Influenza:             03/2013 at work Zostavax:             n/a  Bone density:      n/a Colonoscopy:      09-22-13 with Dr. Watt Climes at [place] due to rectal bleeding:polyps.  Next colonoscopy due 09/2018.  Cholesterol check:  Slightly elevated  Family History  Problem Relation Age of Onset  . Arthritis Mother   . Hypertension Mother   . Diabetes Mother     There are no active problems to display for this  patient.  Past Medical History  Diagnosis Date  . Thigh DVT (deep venous thrombosis) 1995  . Plantar fasciitis 2007  . Hypertension     asthma related no medication taken    Past Surgical History  Procedure Laterality Date  . Laparoscopy  1991  . Shoulder arthroscopy  2004  . Hand surgery Right 2006  . Knee arthroscopy Left 04/2010    ALLERGIES: Latex  Current Outpatient Prescriptions  Medication Sig Dispense Refill  . ASMANEX 60 METERED DOSES 220 MCG/INH inhaler       . aspirin 81 MG tablet Take 81 mg by mouth daily. TAKES @@ PILLS ONCE DAILY      . buPROPion (WELLBUTRIN XL) 300 MG 24 hr tablet Take 300 mg by mouth daily.      . Calcium Carbonate Antacid (TUMS PO) Take by mouth daily.       Marland Kitchen econazole nitrate 1 % cream Apply topically daily.  30 g  0  . EPIPEN 2-PAK 0.3 MG/0.3ML  SOAJ       . Levonorgestrel (MIRENA IU) by Intrauterine route. IUD      . montelukast (SINGULAIR) 10 MG tablet as needed.       Marland Kitchen PROVENTIL HFA 108 (90 BASE) MCG/ACT inhaler as needed.        No current facility-administered medications for this visit.     ROS:  Pertinent items are noted in HPI.  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   PHYSICAL EXAMINATION:    BP 140/80  Pulse 70  Ht 5' 7.5" (1.715 m)  Wt 281 lb (127.461 kg)  BMI 43.34 kg/m2  LMP 10/29/2013   Wt Readings from Last 3 Encounters:  11/02/13 281 lb (127.461 kg)  03/28/13 260 lb (117.935 kg)  10/25/12 262 lb (118.842 kg)     Ht Readings from Last 3 Encounters:  11/02/13 5' 7.5" (1.715 m)  03/28/13 5' 8"  (1.727 m)  10/25/12 5' 8"  (1.727 m)    General appearance: alert, cooperative and appears stated age Head: Normocephalic, without obvious abnormality, atraumatic Neck: no adenopathy, supple, symmetrical, trachea midline and thyroid not enlarged, symmetric, no tenderness/mass/nodules Lungs: clear to auscultation bilaterally Breasts: Inspection negative, No nipple retraction or dimpling, No nipple  discharge or bleeding, No axillary or supraclavicular adenopathy, Normal to palpation without dominant masses Heart: regular rate and rhythm Abdomen: soft, non-tender; no masses,  no organomegaly Extremities: extremities normal, atraumatic, no cyanosis or edema Skin: Skin color, texture, turgor normal. No rashes or lesions Lymph nodes: Cervical, supraclavicular, and axillary nodes normal. No abnormal inguinal nodes palpated Neurologic: Grossly normal  Pelvic: External genitalia:  no lesions              Urethra:  normal appearing urethra with no masses, tenderness or lesions              Bartholins and Skenes: normal                 Vagina: normal appearing vagina with normal color and discharge, no lesions.  Second degree cystocele, first degree uterine prolapse, minimal rectocele.               Cervix: normal appearance.  Strings not seen.               Pap and high risk HPV testing done: no.            Bimanual Exam:  Uterus:  uterus is normal size, shape, consistency and nontender                                      Adnexa: normal adnexa in size, nontender and no masses                                      Rectovaginal: Confirms                                      Anus:  normal sphincter tone, no lesions  ASSESSMENT  Normal gynecologic exam. Incomplete uterovaginal prolapse.  Mirena IUD.  Adenomyosis.  History of of DVT.   PLAN  Mammogram recommended yearly.  Pap smear and high risk HPV testing not indicated.  Counseled on self breast exam, weight loss. Discussed pelvic organ  prolapse and stress incontinence and potential treatments including weight loss, physical therapy, and surgical treatment such as LAVH/with or without BSO, and anterior colporrhaphy with TVT and cystoscopy.  Return annually or prn   An After Visit Summary was printed and given to the patient.

## 2013-11-02 NOTE — Patient Instructions (Signed)

## 2014-02-25 ENCOUNTER — Encounter: Payer: Self-pay | Admitting: Obstetrics and Gynecology

## 2014-04-15 ENCOUNTER — Encounter: Payer: Self-pay | Admitting: Obstetrics and Gynecology

## 2014-11-06 ENCOUNTER — Ambulatory Visit (INDEPENDENT_AMBULATORY_CARE_PROVIDER_SITE_OTHER): Payer: BLUE CROSS/BLUE SHIELD | Admitting: Obstetrics and Gynecology

## 2014-11-06 ENCOUNTER — Encounter: Payer: Self-pay | Admitting: Obstetrics and Gynecology

## 2014-11-06 ENCOUNTER — Ambulatory Visit: Payer: BC Managed Care – PPO | Admitting: Obstetrics and Gynecology

## 2014-11-06 VITALS — BP 128/74 | HR 66 | Resp 16 | Ht 67.5 in | Wt 271.4 lb

## 2014-11-06 DIAGNOSIS — Z01419 Encounter for gynecological examination (general) (routine) without abnormal findings: Secondary | ICD-10-CM | POA: Diagnosis not present

## 2014-11-06 DIAGNOSIS — N393 Stress incontinence (female) (male): Secondary | ICD-10-CM

## 2014-11-06 DIAGNOSIS — N819 Female genital prolapse, unspecified: Secondary | ICD-10-CM

## 2014-11-06 NOTE — Patient Instructions (Signed)

## 2014-11-06 NOTE — Progress Notes (Signed)
Patient ID: Monica Good, female   DOB: Dec 06, 1965, 49 y.o.   MRN: [id number] 49 y.o. [id number] Married Caucasian female here for annual exam.  LMP - 11/01/14 - light spotting.   Patient states did have vaginal bleeding this weekend.  It was not enough to wear a pad but enough to be in panties and to see with wiping. Mirena IUD in place. Some hot flashes coming and going.  No pelvic pain.   Has pelvic organ prolapse. Some leakage if coughs a lot.  Does some Kegels but not regularly.   Has multiple allergies to beef, pork.   PCP: Carol Ada, MD   Patient's last menstrual period was 05/14/2013 (approximate).          Sexually active: Yes.   female partner The current method of family planning is vasectomy. --Pt. Also has Mirena IUD--inserted 11-19-11.   Exercising: No.  none. Smoker:  no  Health Maintenance: Pap:  10-25-12 WNL Neg HR HPV History of abnormal Pap:  no MMG:  06-15-13 fibroglandular density/ WNL [facility] Done in January 2016 and this was normal per patient.  Colonoscopy:  09-22-13 polyps with Dr. Clarene Essex.  Next due 09/2018. BMD:   n/a  Result  n/a TDaP:  2008 Screening Labs:  Hb today: PCP, Urine today: unable to void   reports that she has never smoked. She has never used smokeless tobacco. She reports that she drinks about 0.6 oz of alcohol per week. She reports that she does not use illicit drugs.  Past Medical History  Diagnosis Date  . Thigh DVT (deep venous thrombosis) 1995  . Plantar fasciitis 2007  . Hypertension     asthma related no medication taken  . Seasonal allergies     Past Surgical History  Procedure Laterality Date  . Laparoscopy  1991  . Shoulder arthroscopy  2004  . Hand surgery Right 2006  . Knee arthroscopy Left 04/2010    Current Outpatient Prescriptions  Medication Sig Dispense Refill  . aspirin 81 MG tablet Take 81 mg by mouth daily. TAKES 2 PILLS ONCE DAILY    . BREO ELLIPTA 100-25 MCG/INH AEPB Inhale 1 puff into the lungs daily.     Marland Kitchen buPROPion (WELLBUTRIN XL) 300 MG 24 hr tablet Take 300 mg by mouth daily.    . Calcium Carbonate Antacid (TUMS PO) Take by mouth daily.     . clobetasol cream (TEMOVATE) 4.16 % Apply 1 application topically as needed.  1  . econazole nitrate 1 % cream Apply topically daily. 30 g 0  . EPIPEN 2-PAK 0.3 MG/0.3ML SOAJ     . Levonorgestrel (MIRENA IU) by Intrauterine route. IUD    . lisinopril (PRINIVIL,ZESTRIL) 5 MG tablet Take 1 tablet by mouth daily.  0  . montelukast (SINGULAIR) 10 MG tablet as needed.     Marland Kitchen NASONEX 50 MCG/ACT nasal spray Inhale 1 puff into the lungs as needed.    Marland Kitchen PROVENTIL HFA 108 (90 BASE) MCG/ACT inhaler as needed.      No current facility-administered medications for this visit.    Family History  Problem Relation Age of Onset  . Arthritis Mother   . Hypertension Mother   . Diabetes Mother     ROS:  Pertinent items are noted in HPI.  Otherwise, a comprehensive ROS was negative.  Exam:   BP 128/74 mmHg  Pulse 66  Resp 16  Ht 5' 7.5" (1.715 m)  Wt 271 lb 6.4 oz (123.106 kg)  BMI 41.86  kg/m2  LMP 05/14/2013 (Approximate)    General appearance: alert, cooperative and appears stated age Head: Normocephalic, without obvious abnormality, atraumatic Neck: no adenopathy, supple, symmetrical, trachea midline and thyroid normal to inspection and palpation Lungs: clear to auscultation bilaterally Breasts: normal appearance, no masses or tenderness, Inspection negative, No nipple retraction or dimpling, No nipple discharge or bleeding, No axillary or supraclavicular adenopathy Heart: regular rate and rhythm Abdomen: soft, non-tender; bowel sounds normal; no masses,  no organomegaly Extremities: extremities normal, atraumatic, no cyanosis or edema Skin: Skin color, texture, turgor normal. No rashes or lesions Lymph nodes: Cervical, supraclavicular, and axillary nodes normal. No abnormal inguinal nodes palpated Neurologic: Grossly normal  Pelvic: External  genitalia:  no lesions              Urethra:  normal appearing urethra with no masses, tenderness or lesions              Bartholins and Skenes: normal                 Vagina: normal appearing vagina with normal color and discharge, no lesions.  Second degree cystocele, first degree uterine prolapse, first degree rectocele.              Cervix: no lesions and IUD strings seen.              Pap taken: No. Bimanual Exam:  Uterus:  normal size, contour, position, consistency, mobility, non-tender              Adnexa: normal adnexa and no mass, fullness, tenderness              Rectovaginal: Yes.  .  Confirms.              Anus:  normal sphincter tone, no lesions  Chaperone was present for exam.  Assessment:   Well woman visit with normal exam. Mirena IUD patient.  Pelvic organ prolapse - incomplete.  Genuine stress incontinence.  Hx DVT.  Plan: Yearly mammogram recommended after age 27.  Recommended self breast exam.  Pap and HR HPV as above. Discussed Calcium, Vitamin D, regular exercise program including cardiovascular and weight bearing exercise. Labs performed.  No..   See orders. Refills given on medications.  No..    Will refer to Ileana Roup for pelvic floor physical therapy.  Follow up annually and prn.      After visit summary provided.

## 2015-02-18 ENCOUNTER — Telehealth: Payer: Self-pay | Admitting: Obstetrics and Gynecology

## 2015-02-18 NOTE — Telephone Encounter (Signed)
Left message on voicemail regarding appointment change.

## 2015-04-18 ENCOUNTER — Telehealth: Payer: Self-pay | Admitting: Internal Medicine

## 2015-04-18 MED ORDER — BREO ELLIPTA 100-25 MCG/INH IN AEPB: 1.0000 | INHALATION_SPRAY | Freq: Every day | RESPIRATORY_TRACT | Status: DC

## 2015-04-18 NOTE — Telephone Encounter (Signed)
Spoke with pt, last seen on 02/21/15 will refill Breo Ellipta to [facility] on [address].

## 2015-04-18 NOTE — Telephone Encounter (Signed)
Her pharmacy has sent requests earlier in the week asking for refills on her Breo Elipta 100 but they still haven't received it. Can we please call it in to the [facility] on [address].

## 2015-08-27 ENCOUNTER — Ambulatory Visit: Payer: BLUE CROSS/BLUE SHIELD | Admitting: Internal Medicine

## 2015-09-01 ENCOUNTER — Ambulatory Visit (INDEPENDENT_AMBULATORY_CARE_PROVIDER_SITE_OTHER): Payer: BLUE CROSS/BLUE SHIELD | Admitting: Internal Medicine

## 2015-09-01 ENCOUNTER — Encounter: Payer: Self-pay | Admitting: Internal Medicine

## 2015-09-01 VITALS — BP 128/76 | HR 74 | Temp 98.4°F | Resp 18

## 2015-09-01 DIAGNOSIS — J302 Other seasonal allergic rhinitis: Secondary | ICD-10-CM | POA: Insufficient documentation

## 2015-09-01 DIAGNOSIS — T7800XA Anaphylactic reaction due to unspecified food, initial encounter: Secondary | ICD-10-CM | POA: Insufficient documentation

## 2015-09-01 DIAGNOSIS — T7800XD Anaphylactic reaction due to unspecified food, subsequent encounter: Secondary | ICD-10-CM | POA: Diagnosis not present

## 2015-09-01 DIAGNOSIS — J4540 Moderate persistent asthma, uncomplicated: Secondary | ICD-10-CM

## 2015-09-01 DIAGNOSIS — J4530 Mild persistent asthma, uncomplicated: Secondary | ICD-10-CM | POA: Insufficient documentation

## 2015-09-01 DIAGNOSIS — J3089 Other allergic rhinitis: Secondary | ICD-10-CM

## 2015-09-01 LAB — PULMONARY FUNCTION TEST

## 2015-09-01 MED ORDER — EPINEPHRINE 0.3 MG/0.3ML IJ SOAJ: INTRAMUSCULAR | Status: DC

## 2015-09-01 MED ORDER — MONTELUKAST SODIUM 10 MG PO TABS: ORAL_TABLET | ORAL | Status: DC

## 2015-09-01 MED ORDER — ALBUTEROL SULFATE HFA 108 (90 BASE) MCG/ACT IN AERS: 2.0000 | INHALATION_SPRAY | RESPIRATORY_TRACT | Status: DC | PRN

## 2015-09-01 MED ORDER — BREO ELLIPTA 100-25 MCG/INH IN AEPB: 1.0000 | INHALATION_SPRAY | Freq: Every day | RESPIRATORY_TRACT | Status: DC

## 2015-09-01 NOTE — Assessment & Plan Note (Addendum)
   Persistent, with increased symptoms recently likely due to springtime allergens and change in weather   Continue Breo Ellipta 100 g 1 puff daily  Continue as needed albuterol  May add montelukast 10 mg daily as needed during the spring

## 2015-09-01 NOTE — Progress Notes (Signed)
History of Present Illness: Monica Good is a 50 y.o. female presenting for follow-up.   HPI Comments: Asthma: At patient's last visit, her montelukast was stopped due to good symptom control. She has done well without any exacerbations or increased need for her rescue inhaler. Recently, with the onset of springtime allergens she has had a little bit chest tightness but has not expressed exercise limitation. She has not required albuterol frequently.  Allergic rhinitis: Skin testing in the past was positive for grass, mold, dust, cat, dog, horse. Symptoms were stable until recently as above. She occasionally sneezes. She has not had any interval sinus infections. She did not feel like her symptoms were worse when she came off the montelukast past. She will be traveling to [place] over the summer and would like to know if she can have a prescription for montelukast past to have on hand if she should needed.  Food allergy: Fish, shellfish, spinach, Lamb, shortness of breath. Because his mouth tingling. Soy causes throat closing. He states that she was positive for beef, 1, pork. She is avoiding all of these foods and has not had any accidental ingestions. She does have an EpiPen but has not needed to use it.   Current Outpatient Prescriptions on File Prior to Visit  Medication Sig Dispense Refill  . aspirin 81 MG tablet Take 81 mg by mouth daily. TAKES 2 PILLS ONCE DAILY    . buPROPion (WELLBUTRIN XL) 300 MG 24 hr tablet Take 300 mg by mouth daily.    . Calcium Carbonate Antacid (TUMS PO) Take by mouth daily.     . clobetasol cream (TEMOVATE) 7.04 % Apply 1 application topically as needed.  1  . econazole nitrate 1 % cream Apply topically daily. 30 g 0  . Levonorgestrel (MIRENA IU) by Intrauterine route. IUD    . lisinopril (PRINIVIL,ZESTRIL) 5 MG tablet Take 1 tablet by mouth daily.  0  . NASONEX 50 MCG/ACT nasal spray Inhale 1 puff into the lungs as needed.     No current  facility-administered medications on file prior to visit.    Assessment and Plan: Moderate persistent asthma  Persistent, with increased symptoms recently likely due to springtime allergens and change in weather   Continue Breo Ellipta 100 g 1 puff daily  Continue as needed albuterol  May add montelukast 10 mg daily as needed during the spring  Allergy with anaphylaxis due to food  Continue avoidance of fish, shellfish, spinach, Lamb, soy, beef, pork  Has EpiPen and action plan-educated on use    Return in about 6 months (around 03/03/2016).  Meds ordered this encounter  Medications  . losartan (COZAAR) 50 MG tablet    Sig: TK 1 T PO  BID    Refill:  5  . metFORMIN (GLUCOPHAGE) 500 MG tablet    Sig: TK 2 TS PO BID WITH FOOD    Refill:  2  . montelukast (SINGULAIR) 10 MG tablet    Sig: ONE TABLET AT NIGHT FOR COUGH OR WHEEZE.    Dispense:  30 tablet    Refill:  5  . BREO ELLIPTA 100-25 MCG/INH AEPB    Sig: Inhale 1 puff into the lungs daily.    Dispense:  1 each    Refill:  5  . EPINEPHrine (EPIPEN 2-PAK) 0.3 mg/0.3 mL IJ SOAJ injection    Sig: USE AS DIRECTED FOR SEVERE ALLERGIC REACTION    Dispense:  2 Device    Refill:  2  .  albuterol (PROAIR HFA) 108 (90 Base) MCG/ACT inhaler    Sig: Inhale 2 puffs into the lungs every 4 (four) hours as needed for wheezing or shortness of breath.    Dispense:  1 Inhaler    Refill:  3    Diagnostics: Spirometry: FEV1 2.5L or 78%, FEV1/FVC  77%.  This is a normal study  Physical Exam: BP 128/76 mmHg  Pulse 74  Temp(Src) 98.4 F (36.9 C) (Oral)  Resp 18   Physical Exam  Constitutional: She appears well-developed and well-nourished. No distress.  Obese  HENT:  Right Ear: External ear normal.  Left Ear: External ear normal.  Nose: Nose normal.  Mouth/Throat: Oropharynx is clear and moist.  Eyes: Conjunctivae are normal. Right eye exhibits no discharge. Left eye exhibits no discharge.  Cardiovascular: Normal rate,  regular rhythm and normal heart sounds.   No murmur heard. Pulmonary/Chest: Effort normal and breath sounds normal. No respiratory distress. She has no wheezes. She has no rales.  Abdominal: Soft. Bowel sounds are normal.  Musculoskeletal: She exhibits no edema.  Lymphadenopathy:    She has no cervical adenopathy.  Neurological: She is alert.  Skin: No rash noted.  Vitals reviewed.   Drug Allergies:  Allergies  Allergen Reactions  . Beef-Derived Products Anaphylaxis  . Lambs Quarters Anaphylaxis    Has not had but is warned by allergist that could have anaphylaxis.  . Other Anaphylaxis    **SEAFOOD**  . Pork-Derived Products Anaphylaxis  . Bee Venom Swelling  . Latex     ROS: Per HPI unless specifically indicated below Review of Systems  Thank you for the opportunity to care for this patient.  Please do not hesitate to contact me with questions.

## 2015-09-01 NOTE — Patient Instructions (Signed)
Moderate persistent asthma  Persistent, with increased symptoms recently likely due to springtime allergens and change in weather   Continue Breo Ellipta 100 g 1 puff daily  Continue as needed albuterol  May add montelukast 10 mg daily as needed during the spring  Allergy with anaphylaxis due to food  Continue avoidance of fish, shellfish, spinach, Lamb, soy, beef, pork  Has EpiPen and action plan-educated on use

## 2015-09-01 NOTE — Assessment & Plan Note (Signed)
   Continue avoidance of fish, shellfish, spinach, Lamb, soy, beef, pork  Has EpiPen and action plan-educated on use

## 2015-09-03 ENCOUNTER — Encounter: Payer: Self-pay | Admitting: Internal Medicine

## 2015-09-08 ENCOUNTER — Encounter: Payer: Self-pay | Admitting: *Deleted

## 2015-11-07 ENCOUNTER — Ambulatory Visit: Payer: BLUE CROSS/BLUE SHIELD | Admitting: Obstetrics and Gynecology

## 2015-11-07 ENCOUNTER — Encounter: Payer: Self-pay | Admitting: Obstetrics and Gynecology

## 2015-11-07 ENCOUNTER — Ambulatory Visit (INDEPENDENT_AMBULATORY_CARE_PROVIDER_SITE_OTHER): Payer: BLUE CROSS/BLUE SHIELD | Admitting: Obstetrics and Gynecology

## 2015-11-07 VITALS — BP 118/80 | HR 72 | Resp 16 | Ht 67.25 in | Wt 273.0 lb

## 2015-11-07 DIAGNOSIS — Z01419 Encounter for gynecological examination (general) (routine) without abnormal findings: Secondary | ICD-10-CM | POA: Diagnosis not present

## 2015-11-07 DIAGNOSIS — Z0000 Encounter for general adult medical examination without abnormal findings: Secondary | ICD-10-CM

## 2015-11-07 DIAGNOSIS — Z1151 Encounter for screening for human papillomavirus (HPV): Secondary | ICD-10-CM | POA: Diagnosis not present

## 2015-11-07 LAB — POCT URINALYSIS DIPSTICK
Bilirubin, UA: NEGATIVE
Blood, UA: NEGATIVE
Glucose, UA: NEGATIVE
Ketones, UA: NEGATIVE
Leukocytes, UA: NEGATIVE
Nitrite, UA: NEGATIVE
Protein, UA: NEGATIVE
Urobilinogen, UA: NEGATIVE
pH, UA: 5

## 2015-11-07 NOTE — Progress Notes (Signed)
50 y.o. G43P4004 Married Caucasian female here for annual exam.    Has Mirena IUD.  Had spotting a week ago.  Brown blood.  Last other episode was one year ago.  This has worked well to treat heavy menses.  Did physical therapy with Ileana Roup.  Was helpful.  Bladder control is only with extreme cough.   Going home to [place] this summer.   PCP:   [place] -   No LMP recorded. Patient is not currently having periods (Reason: IUD).           Sexually active: Yes.    The current method of family planning is vasectomy, Mirena place 11/19/11.  Exercising: No.  exercise Smoker:  no  Health Maintenance: Pap:  10-25-12 neg HPV HR neg History of abnormal Pap:  no MMG:  08-05-15 category b density, birads 1:neg Colonoscopy:  09-22-13 polyps with dr Altamese Dilling magod f/u 2020 BMD:   n/a  Result  n/a TDaP:  2008 HIV: HIV neg yrs Hep C: not done Screening Labs:  Hb today: pcp, Urine today: neg Self breast exam: done occ   reports that she has never smoked. She has never used smokeless tobacco. She reports that she drinks alcohol. She reports that she does not use illicit drugs.  Past Medical History  Diagnosis Date  . Thigh DVT (deep venous thrombosis) ([facility]) 1995  . Plantar fasciitis 2007  . Hypertension     asthma related no medication taken    Past Surgical History  Procedure Laterality Date  . Laparoscopy  1991  . Shoulder arthroscopy  2004  . Hand surgery Right 2006  . Knee arthroscopy Left 04/2010    Current Outpatient Prescriptions  Medication Sig Dispense Refill  . albuterol (PROAIR HFA) 108 (90 Base) MCG/ACT inhaler Inhale 2 puffs into the lungs every 4 (four) hours as needed for wheezing or shortness of breath. 1 Inhaler 3  . aspirin 81 MG tablet Take 81 mg by mouth daily. TAKES 2 PILLS ONCE DAILY    . BREO ELLIPTA 100-25 MCG/INH AEPB Inhale 1 puff into the lungs daily. 1 each 5  . Choline Dihydrogen Citrate (CHOLINE CITRATE PO) Take by mouth.    .  Levonorgestrel (MIRENA IU) by Intrauterine route. IUD    . losartan (COZAAR) 50 MG tablet TK 1 T PO  BID  5  . metFORMIN (GLUCOPHAGE) 500 MG tablet TK 2 TS PO BID WITH FOOD  2  . UNABLE TO FIND 2 (two) times daily. Alpha lipoic acid    . UNABLE TO FIND Chromate GTF cromium polynicotinate 624m    . UNABLE TO FIND Holy basil    . UNABLE TO FIND Iodine complex    . UNABLE TO FIND memorall    . UNABLE TO FIND Methyl b12    . UNABLE TO FIND MinRex    . UNABLE TO FIND OptiMag 125    . VITAMIN D, ERGOCALCIFEROL, PO Take 50,000 Int'l Units by mouth. Every 5 days    . EPINEPHrine (EPIPEN 2-PAK) 0.3 mg/0.3 mL IJ SOAJ injection USE AS DIRECTED FOR SEVERE ALLERGIC REACTION (Patient not taking: Reported on 11/07/2015) 2 Device 2   No current facility-administered medications for this visit.    Family History  Problem Relation Age of Onset  . Arthritis Mother   . Hypertension Mother   . Diabetes Mother   . Allergic rhinitis Neg Hx   . Angioedema Neg Hx   . Eczema Neg Hx   .  Asthma Neg Hx   . Immunodeficiency Neg Hx   . Urticaria Neg Hx     ROS:  Pertinent items are noted in HPI.  Otherwise, a comprehensive ROS was negative.  Exam:   BP 118/80 mmHg  Pulse 72  Resp 16  Ht 5' 7.25" (1.708 m)  Wt 273 lb (123.832 kg)  BMI 42.45 kg/m2  LMP     General appearance: alert, cooperative and appears stated age Head: Normocephalic, without obvious abnormality, atraumatic Neck: no adenopathy, supple, symmetrical, trachea midline and thyroid normal to inspection and palpation Lungs: clear to auscultation bilaterally Breasts: normal appearance, no masses or tenderness, Inspection negative, No nipple retraction or dimpling, No nipple discharge or bleeding, No axillary or supraclavicular adenopathy Heart: regular rate and rhythm Abdomen: incisions:  No.    , soft, non-tender; no masses, no organomegaly Extremities: extremities normal, atraumatic, no cyanosis or edema Skin: Skin color, texture, turgor  normal. No rashes or lesions Lymph nodes: Cervical, supraclavicular, and axillary nodes normal. No abnormal inguinal nodes palpated Neurologic: Grossly normal  Pelvic: External genitalia:   varicose veins of vulva.  Left mons with probable chronic inclusion cyst.  No abscess today.  Small follicle irritation below this.               Urethra:  normal appearing urethra with no masses, tenderness or lesions              Bartholins and Skenes: normal                 Vagina: normal appearing vagina with normal color and discharge, no lesions              Cervix: no lesions and IUD strings not seen but are palpable with bimanual exam.  Second degree cystocele, first degree uterine, first degree rectocele.               Pap taken: Yes.   Bimanual Exam:  Uterus:  normal size, contour, position, consistency, mobility, non-tender               Adnexa: normal adnexa and no mass, fullness, tenderness              Rectal exam: Yes.  .  Confirms.              Anus:  normal sphincter tone, no lesions  Chaperone was present for exam.  Assessment:   Well woman visit with normal exam. Mirena IUD patient.  Pelvic organ prolapse - incomplete. Second degree cystocele, first degree uterine, first degree rectocele.  Genuine stress incontinence. Improved. Hx DVT.  Plan: Yearly mammogram recommended after age 65.  Recommended self breast exam.  Pap and HR HPV as above. Discussed Calcium, Vitamin D, regular exercise program including cardiovascular and weight bearing exercise. Labs performed.  No..   Prescription medication(s) given.  No..   Plan for IUD removal next year.  Do kegel's.  Return for difficulty with bladder or bowel emptying or worsening of incontinence. Weight loss through dietary change and exercise discussed.  Encouraged slow, gradual and consistent weight loss. Follow up annually and prn.      After visit summary provided.

## 2015-11-07 NOTE — Patient Instructions (Signed)
Health Maintenance, Female Adopting a healthy lifestyle and getting preventive care can go a long way to promote health and wellness. Talk with your health care provider about what schedule of regular examinations is right for you. This is a good chance for you to check in with your provider about disease prevention and staying healthy. In between checkups, there are plenty of things you can do on your own. Experts have done a lot of research about which lifestyle changes and preventive measures are most likely to keep you healthy. Ask your health care provider for more information. WEIGHT AND DIET  Eat a healthy diet  Be sure to include plenty of vegetables, fruits, low-fat dairy products, and lean protein.  Do not eat a lot of foods high in solid fats, added sugars, or salt.  Get regular exercise. This is one of the most important things you can do for your health.  Most adults should exercise for at least 150 minutes each week. The exercise should increase your heart rate and make you sweat (moderate-intensity exercise).  Most adults should also do strengthening exercises at least twice a week. This is in addition to the moderate-intensity exercise.  Maintain a healthy weight  Body mass index (BMI) is a measurement that can be used to identify possible weight problems. It estimates body fat based on height and weight. Your health care provider can help determine your BMI and help you achieve or maintain a healthy weight.  For females 20 years of age and older:   A BMI below 18.5 is considered underweight.  A BMI of 18.5 to 24.9 is normal.  A BMI of 25 to 29.9 is considered overweight.  A BMI of 30 and above is considered obese.  Watch levels of cholesterol and blood lipids  You should start having your blood tested for lipids and cholesterol at 52 years of age, then have this test every 5 years.  You may need to have your cholesterol levels checked more often if:  Your lipid  or cholesterol levels are high.  You are older than 52 years of age.  You are at high risk for heart disease.  CANCER SCREENING   Lung Cancer  Lung cancer screening is recommended for adults 55-80 years old who are at high risk for lung cancer because of a history of smoking.  A yearly low-dose CT scan of the lungs is recommended for people who:  Currently smoke.  Have quit within the past 15 years.  Have at least a 30-pack-year history of smoking. A pack year is smoking an average of one pack of cigarettes a day for 1 year.  Yearly screening should continue until it has been 15 years since you quit.  Yearly screening should stop if you develop a health problem that would prevent you from having lung cancer treatment.  Breast Cancer  Practice breast self-awareness. This means understanding how your breasts normally appear and feel.  It also means doing regular breast self-exams. Let your health care provider know about any changes, no matter how small.  If you are in your 20s or 30s, you should have a clinical breast exam (CBE) by a health care provider every 1-3 years as part of a regular health exam.  If you are 40 or older, have a CBE every year. Also consider having a breast X-ray (mammogram) every year.  If you have a family history of breast cancer, talk to your health care provider about genetic screening.  If you   are at high risk for breast cancer, talk to your health care provider about having an MRI and a mammogram every year.  Breast cancer gene (BRCA) assessment is recommended for women who have family members with BRCA-related cancers. BRCA-related cancers include:  Breast.  Ovarian.  Tubal.  Peritoneal cancers.  Results of the assessment will determine the need for genetic counseling and BRCA1 and BRCA2 testing. Cervical Cancer Your health care provider may recommend that you be screened regularly for cancer of the pelvic organs (ovaries, uterus, and  vagina). This screening involves a pelvic examination, including checking for microscopic changes to the surface of your cervix (Pap test). You may be encouraged to have this screening done every 3 years, beginning at age 21.  For women ages 30-65, health care providers may recommend pelvic exams and Pap testing every 3 years, or they may recommend the Pap and pelvic exam, combined with testing for human papilloma virus (HPV), every 5 years. Some types of HPV increase your risk of cervical cancer. Testing for HPV may also be done on women of any age with unclear Pap test results.  Other health care providers may not recommend any screening for nonpregnant women who are considered low risk for pelvic cancer and who do not have symptoms. Ask your health care provider if a screening pelvic exam is right for you.  If you have had past treatment for cervical cancer or a condition that could lead to cancer, you need Pap tests and screening for cancer for at least 20 years after your treatment. If Pap tests have been discontinued, your risk factors (such as having a new sexual partner) need to be reassessed to determine if screening should resume. Some women have medical problems that increase the chance of getting cervical cancer. In these cases, your health care provider may recommend more frequent screening and Pap tests. Colorectal Cancer  This type of cancer can be detected and often prevented.  Routine colorectal cancer screening usually begins at 52 years of age and continues through 52 years of age.  Your health care provider may recommend screening at an earlier age if you have risk factors for colon cancer.  Your health care provider may also recommend using home test kits to check for hidden blood in the stool.  A small camera at the end of a tube can be used to examine your colon directly (sigmoidoscopy or colonoscopy). This is done to check for the earliest forms of colorectal  cancer.  Routine screening usually begins at age 50.  Direct examination of the colon should be repeated every 5-10 years through 52 years of age. However, you may need to be screened more often if early forms of precancerous polyps or small growths are found. Skin Cancer  Check your skin from head to toe regularly.  Tell your health care provider about any new moles or changes in moles, especially if there is a change in a mole's shape or color.  Also tell your health care provider if you have a mole that is larger than the size of a pencil eraser.  Always use sunscreen. Apply sunscreen liberally and repeatedly throughout the day.  Protect yourself by wearing long sleeves, pants, a wide-brimmed hat, and sunglasses whenever you are outside. HEART DISEASE, DIABETES, AND HIGH BLOOD PRESSURE   High blood pressure causes heart disease and increases the risk of stroke. High blood pressure is more likely to develop in:  People who have blood pressure in the high end   of the normal range (130-139/85-89 mm Hg).  People who are overweight or obese.  People who are African American.  If you are 38-23 years of age, have your blood pressure checked every 3-5 years. If you are 61 years of age or older, have your blood pressure checked every year. You should have your blood pressure measured twice--once when you are at a hospital or clinic, and once when you are not at a hospital or clinic. Record the average of the two measurements. To check your blood pressure when you are not at a hospital or clinic, you can use:  An automated blood pressure machine at a pharmacy.  A home blood pressure monitor.  If you are between 45 years and 39 years old, ask your health care provider if you should take aspirin to prevent strokes.  Have regular diabetes screenings. This involves taking a blood sample to check your fasting blood sugar level.  If you are at a normal weight and have a low risk for diabetes,  have this test once every three years after 52 years of age.  If you are overweight and have a high risk for diabetes, consider being tested at a younger age or more often. PREVENTING INFECTION  Hepatitis B  If you have a higher risk for hepatitis B, you should be screened for this virus. You are considered at high risk for hepatitis B if:  You were born in a country where hepatitis B is common. Ask your health care provider which countries are considered high risk.  Your parents were born in a high-risk country, and you have not been immunized against hepatitis B (hepatitis B vaccine).  You have HIV or AIDS.  You use needles to inject street drugs.  You live with someone who has hepatitis B.  You have had sex with someone who has hepatitis B.  You get hemodialysis treatment.  You take certain medicines for conditions, including cancer, organ transplantation, and autoimmune conditions. Hepatitis C  Blood testing is recommended for:  Everyone born from [birth year] through 1967.  Anyone with known risk factors for hepatitis C. Sexually transmitted infections (STIs)  You should be screened for sexually transmitted infections (STIs) including gonorrhea and chlamydia if:  You are sexually active and are younger than 52 years of age.  You are older than 53 years of age and your health care provider tells you that you are at risk for this type of infection.  Your sexual activity has changed since you were last screened and you are at an increased risk for chlamydia or gonorrhea. Ask your health care provider if you are at risk.  If you do not have HIV, but are at risk, it may be recommended that you take a prescription medicine daily to prevent HIV infection. This is called pre-exposure prophylaxis (PrEP). You are considered at risk if:  You are sexually active and do not regularly use condoms or know the HIV status of your partner(s).  You take drugs by injection.  You are sexually  active with a partner who has HIV. Talk with your health care provider about whether you are at high risk of being infected with HIV. If you choose to begin PrEP, you should first be tested for HIV. You should then be tested every 3 months for as long as you are taking PrEP.  PREGNANCY   If you are premenopausal and you may become pregnant, ask your health care provider about preconception counseling.  If you may  become pregnant, take 400 to 800 micrograms (mcg) of folic acid every day.  If you want to prevent pregnancy, talk to your health care provider about birth control (contraception). OSTEOPOROSIS AND MENOPAUSE   Osteoporosis is a disease in which the bones lose minerals and strength with aging. This can result in serious bone fractures. Your risk for osteoporosis can be identified using a bone density scan.  If you are 61 years of age or older, or if you are at risk for osteoporosis and fractures, ask your health care provider if you should be screened.  Ask your health care provider whether you should take a calcium or vitamin D supplement to lower your risk for osteoporosis.  Menopause may have certain physical symptoms and risks.  Hormone replacement therapy may reduce some of these symptoms and risks. Talk to your health care provider about whether hormone replacement therapy is right for you.  HOME CARE INSTRUCTIONS   Schedule regular health, dental, and eye exams.  Stay current with your immunizations.   Do not use any tobacco products including cigarettes, chewing tobacco, or electronic cigarettes.  If you are pregnant, do not drink alcohol.  If you are breastfeeding, limit how much and how often you drink alcohol.  Limit alcohol intake to no more than 1 drink per day for nonpregnant women. One drink equals 12 ounces of beer, 5 ounces of wine, or 1 ounces of hard liquor.  Do not use street drugs.  Do not share needles.  Ask your health care provider for help if  you need support or information about quitting drugs.  Tell your health care provider if you often feel depressed.  Tell your health care provider if you have ever been abused or do not feel safe at home.   This information is not intended to replace advice given to you by your health care provider. Make sure you discuss any questions you have with your health care provider.   Document Released: 12/14/2010 Document Revised: 06/21/2014 Document Reviewed: 05/02/2013 Elsevier Interactive Patient Education [employer].

## 2015-11-12 ENCOUNTER — Ambulatory Visit: Payer: BLUE CROSS/BLUE SHIELD | Admitting: Obstetrics and Gynecology

## 2015-11-13 LAB — IPS PAP TEST WITH HPV

## 2015-11-17 ENCOUNTER — Ambulatory Visit: Payer: BLUE CROSS/BLUE SHIELD | Admitting: Obstetrics and Gynecology

## 2016-03-03 ENCOUNTER — Ambulatory Visit: Payer: BLUE CROSS/BLUE SHIELD | Admitting: Allergy and Immunology

## 2016-03-11 ENCOUNTER — Encounter: Payer: Self-pay | Admitting: Allergy and Immunology

## 2016-03-11 ENCOUNTER — Ambulatory Visit (INDEPENDENT_AMBULATORY_CARE_PROVIDER_SITE_OTHER): Payer: BLUE CROSS/BLUE SHIELD | Admitting: Allergy and Immunology

## 2016-03-11 VITALS — BP 110/84 | HR 72 | Temp 98.0°F | Resp 16

## 2016-03-11 DIAGNOSIS — J3089 Other allergic rhinitis: Secondary | ICD-10-CM | POA: Diagnosis not present

## 2016-03-11 DIAGNOSIS — J4540 Moderate persistent asthma, uncomplicated: Secondary | ICD-10-CM

## 2016-03-11 MED ORDER — BREO ELLIPTA 100-25 MCG/INH IN AEPB: 1.0000 | INHALATION_SPRAY | Freq: Every day | RESPIRATORY_TRACT | 5 refills | Status: DC

## 2016-03-11 NOTE — Progress Notes (Signed)
Follow-up Note  RE: Monica Good MRN: [id number] DOB: 1965/07/27 Date of Office Visit: 03/11/2016  Primary care provider: Reginia Naas, MD Referring provider: Carol Ada, MD  History of present illness: Monica Good is a 50 y.o. female with persistent asthma, allergic rhinitis, and food allergies presents today for follow up.  She was last seen in this clinic on 09/01/2015 by Dr. Emilio Math who has since left the practice. She reports that her asthma has been well controlled with Breo Ellipta 100-25 g, one inhalation daily.  She rarely requires albuterol rescue and denies nocturnal awakenings due to lower respiratory symptoms.  She reports that her nasal symptoms are well controlled and she has no nasal symptom complaints today.   Assessment and plan: Moderate persistent asthma Well-controlled.  We will not stepdown therapy at this time due to upcoming weather changes.  Continue Breo Ellipta 100-25 g, one inhalation daily, and albuterol every 4-6 hours as needed.  Subjective and objective measures of pulmonary function will be followed and the treatment plan will be adjusted accordingly.  Allergic rhinitis Stable.  Continue appropriate allergen avoidance measures and over-the-counter antihistamines as needed.   Meds ordered this encounter  Medications  . BREO ELLIPTA 100-25 MCG/INH AEPB    Sig: Inhale 1 puff into the lungs daily.    Dispense:  1 each    Refill:  5    Diagnostics: Spirometry:  Normal with an FEV1 of 93% predicted.  Please see scanned spirometry results for details.    Physical examination: Blood pressure 110/84, pulse 72, temperature 98 F (36.7 C), temperature source Oral, resp. rate 16, SpO2 97 %.  General: Alert, interactive, in no acute distress. HEENT: TMs pearly gray, turbinates mildly edematous without discharge, post-pharynx unremarkable. Neck: Supple without lymphadenopathy. Lungs: Clear to auscultation without wheezing,  rhonchi or rales. CV: Normal S1, S2 without murmurs. Skin: Warm and dry, without lesions or rashes.  The following portions of the patient's history were reviewed and updated as appropriate: allergies, current medications, past family history, past medical history, past social history, past surgical history and problem list.    Medication List       Accurate as of 03/11/16  3:55 PM. Always use your most recent med list.          albuterol 108 (90 Base) MCG/ACT inhaler Commonly known as:  PROAIR HFA Inhale 2 puffs into the lungs every 4 (four) hours as needed for wheezing or shortness of breath.   aspirin 81 MG tablet Take 81 mg by mouth daily. TAKES 2 PILLS ONCE DAILY   BREO ELLIPTA 100-25 MCG/INH Aepb Generic drug:  fluticasone furoate-vilanterol Inhale 1 puff into the lungs daily.   clobetasol cream 0.05 % Commonly known as:  TEMOVATE APPLY TOPICALLY AA BID   EPINEPHrine 0.3 mg/0.3 mL Soaj injection Commonly known as:  EPIPEN 2-PAK USE AS DIRECTED FOR SEVERE ALLERGIC REACTION   losartan 50 MG tablet Commonly known as:  COZAAR TK 1 T PO  BID   metFORMIN 500 MG tablet Commonly known as:  GLUCOPHAGE TK 2 TS PO BID WITH FOOD   MIRENA IU by Intrauterine route. IUD   multivitamin tablet Take by mouth daily.       Allergies  Allergen Reactions  . Beef-Derived Products Anaphylaxis  . Lambs Quarters Anaphylaxis    Has not had but is warned by allergist that could have anaphylaxis.  . Other Anaphylaxis    **SEAFOOD**  . Pork-Derived Products Anaphylaxis  . Bee Venom Swelling  .  Latex     I appreciate the opportunity to take part in Monica Good care. Please do not hesitate to contact me with questions.  Sincerely,   R. Edgar Frisk, MD

## 2016-03-11 NOTE — Assessment & Plan Note (Signed)
Stable.  Continue appropriate allergen avoidance measures and over-the-counter antihistamines as needed.

## 2016-03-11 NOTE — Assessment & Plan Note (Signed)
Well-controlled.  We will not stepdown therapy at this time due to upcoming weather changes.  Continue Breo Ellipta 100-25 g, one inhalation daily, and albuterol every 4-6 hours as needed.  Subjective and objective measures of pulmonary function will be followed and the treatment plan will be adjusted accordingly.

## 2016-03-11 NOTE — Patient Instructions (Signed)
Moderate persistent asthma Well-controlled.  We will not stepdown therapy at this time due to upcoming weather changes.  Continue Breo Ellipta 100-25 g, one inhalation daily, and albuterol every 4-6 hours as needed.  Subjective and objective measures of pulmonary function will be followed and the treatment plan will be adjusted accordingly.  Allergic rhinitis Stable.  Continue appropriate allergen avoidance measures and over-the-counter antihistamines as needed.   Return in about 4 months (around 07/11/2016), or if symptoms worsen or fail to improve.

## 2016-03-12 DIAGNOSIS — M7542 Impingement syndrome of left shoulder: Secondary | ICD-10-CM | POA: Diagnosis not present

## 2016-03-12 DIAGNOSIS — G5601 Carpal tunnel syndrome, right upper limb: Secondary | ICD-10-CM | POA: Diagnosis not present

## 2016-03-12 DIAGNOSIS — M5126 Other intervertebral disc displacement, lumbar region: Secondary | ICD-10-CM | POA: Diagnosis not present

## 2016-03-12 DIAGNOSIS — M7541 Impingement syndrome of right shoulder: Secondary | ICD-10-CM | POA: Diagnosis not present

## 2016-04-02 DIAGNOSIS — M7542 Impingement syndrome of left shoulder: Secondary | ICD-10-CM | POA: Diagnosis not present

## 2016-04-02 DIAGNOSIS — M7541 Impingement syndrome of right shoulder: Secondary | ICD-10-CM | POA: Diagnosis not present

## 2016-04-02 DIAGNOSIS — G5601 Carpal tunnel syndrome, right upper limb: Secondary | ICD-10-CM | POA: Diagnosis not present

## 2016-04-02 DIAGNOSIS — M5126 Other intervertebral disc displacement, lumbar region: Secondary | ICD-10-CM | POA: Diagnosis not present

## 2016-04-22 DIAGNOSIS — E559 Vitamin D deficiency, unspecified: Secondary | ICD-10-CM | POA: Diagnosis not present

## 2016-04-22 DIAGNOSIS — R739 Hyperglycemia, unspecified: Secondary | ICD-10-CM | POA: Diagnosis not present

## 2016-04-22 DIAGNOSIS — E7219 Other disorders of sulfur-bearing amino-acid metabolism: Secondary | ICD-10-CM | POA: Diagnosis not present

## 2016-05-05 DIAGNOSIS — M5126 Other intervertebral disc displacement, lumbar region: Secondary | ICD-10-CM | POA: Diagnosis not present

## 2016-05-05 DIAGNOSIS — M7542 Impingement syndrome of left shoulder: Secondary | ICD-10-CM | POA: Diagnosis not present

## 2016-05-05 DIAGNOSIS — G5601 Carpal tunnel syndrome, right upper limb: Secondary | ICD-10-CM | POA: Diagnosis not present

## 2016-05-05 DIAGNOSIS — M7541 Impingement syndrome of right shoulder: Secondary | ICD-10-CM | POA: Diagnosis not present

## 2016-05-11 DIAGNOSIS — J4520 Mild intermittent asthma, uncomplicated: Secondary | ICD-10-CM | POA: Diagnosis not present

## 2016-05-11 DIAGNOSIS — E7800 Pure hypercholesterolemia, unspecified: Secondary | ICD-10-CM | POA: Diagnosis not present

## 2016-05-11 DIAGNOSIS — Z0000 Encounter for general adult medical examination without abnormal findings: Secondary | ICD-10-CM | POA: Diagnosis not present

## 2016-05-11 DIAGNOSIS — I10 Essential (primary) hypertension: Secondary | ICD-10-CM | POA: Diagnosis not present

## 2016-05-11 DIAGNOSIS — Z23 Encounter for immunization: Secondary | ICD-10-CM | POA: Diagnosis not present

## 2016-05-31 ENCOUNTER — Other Ambulatory Visit: Payer: Self-pay | Admitting: Allergy

## 2016-05-31 DIAGNOSIS — J4540 Moderate persistent asthma, uncomplicated: Secondary | ICD-10-CM

## 2016-05-31 MED ORDER — BREO ELLIPTA 100-25 MCG/INH IN AEPB: 1.0000 | INHALATION_SPRAY | Freq: Every day | RESPIRATORY_TRACT | 2 refills | Status: DC

## 2016-07-26 DIAGNOSIS — M5126 Other intervertebral disc displacement, lumbar region: Secondary | ICD-10-CM | POA: Diagnosis not present

## 2016-07-26 DIAGNOSIS — M7541 Impingement syndrome of right shoulder: Secondary | ICD-10-CM | POA: Diagnosis not present

## 2016-07-26 DIAGNOSIS — M7542 Impingement syndrome of left shoulder: Secondary | ICD-10-CM | POA: Diagnosis not present

## 2016-07-26 DIAGNOSIS — G5601 Carpal tunnel syndrome, right upper limb: Secondary | ICD-10-CM | POA: Diagnosis not present

## 2016-08-04 DIAGNOSIS — Z1231 Encounter for screening mammogram for malignant neoplasm of breast: Secondary | ICD-10-CM | POA: Diagnosis not present

## 2016-08-12 ENCOUNTER — Encounter: Payer: Self-pay | Admitting: Obstetrics and Gynecology

## 2016-08-18 DIAGNOSIS — M25561 Pain in right knee: Secondary | ICD-10-CM | POA: Diagnosis not present

## 2016-08-18 DIAGNOSIS — G8929 Other chronic pain: Secondary | ICD-10-CM | POA: Diagnosis not present

## 2016-08-18 DIAGNOSIS — M170 Bilateral primary osteoarthritis of knee: Secondary | ICD-10-CM | POA: Diagnosis not present

## 2016-08-18 DIAGNOSIS — M1711 Unilateral primary osteoarthritis, right knee: Secondary | ICD-10-CM | POA: Diagnosis not present

## 2016-08-23 DIAGNOSIS — M7542 Impingement syndrome of left shoulder: Secondary | ICD-10-CM | POA: Diagnosis not present

## 2016-08-23 DIAGNOSIS — M1711 Unilateral primary osteoarthritis, right knee: Secondary | ICD-10-CM | POA: Diagnosis not present

## 2016-08-23 DIAGNOSIS — M7541 Impingement syndrome of right shoulder: Secondary | ICD-10-CM | POA: Diagnosis not present

## 2016-08-23 DIAGNOSIS — M5126 Other intervertebral disc displacement, lumbar region: Secondary | ICD-10-CM | POA: Diagnosis not present

## 2016-09-21 ENCOUNTER — Ambulatory Visit: Payer: BLUE CROSS/BLUE SHIELD | Admitting: Pediatrics

## 2016-09-22 ENCOUNTER — Ambulatory Visit (INDEPENDENT_AMBULATORY_CARE_PROVIDER_SITE_OTHER): Payer: BLUE CROSS/BLUE SHIELD | Admitting: Allergy and Immunology

## 2016-09-22 ENCOUNTER — Encounter: Payer: Self-pay | Admitting: Allergy and Immunology

## 2016-09-22 ENCOUNTER — Other Ambulatory Visit: Payer: Self-pay | Admitting: Allergy and Immunology

## 2016-09-22 VITALS — BP 128/76 | HR 70 | Temp 98.3°F | Resp 16 | Ht 67.5 in | Wt 269.0 lb

## 2016-09-22 DIAGNOSIS — J309 Allergic rhinitis, unspecified: Secondary | ICD-10-CM | POA: Diagnosis not present

## 2016-09-22 DIAGNOSIS — T7800XD Anaphylactic reaction due to unspecified food, subsequent encounter: Secondary | ICD-10-CM

## 2016-09-22 DIAGNOSIS — L253 Unspecified contact dermatitis due to other chemical products: Secondary | ICD-10-CM | POA: Insufficient documentation

## 2016-09-22 DIAGNOSIS — L230 Allergic contact dermatitis due to metals: Secondary | ICD-10-CM

## 2016-09-22 DIAGNOSIS — J4540 Moderate persistent asthma, uncomplicated: Secondary | ICD-10-CM | POA: Diagnosis not present

## 2016-09-22 MED ORDER — LEVOCETIRIZINE DIHYDROCHLORIDE 5 MG PO TABS: 5.0000 mg | ORAL_TABLET | Freq: Every evening | ORAL | 5 refills | Status: DC

## 2016-09-22 MED ORDER — MONTELUKAST SODIUM 10 MG PO TABS: 10.0000 mg | ORAL_TABLET | Freq: Every day | ORAL | 5 refills | Status: DC

## 2016-09-22 MED ORDER — MOMETASONE FUROATE 50 MCG/ACT NA SUSP: 2.0000 | Freq: Every day | NASAL | 5 refills | Status: DC

## 2016-09-22 NOTE — Assessment & Plan Note (Signed)
   Aeroallergen avoidance measures have been discussed and provided in written form.  A prescription has been provided for levocetirizine, 22m daily as needed.  A prescription has been provided for Nasonex nasal spray, one spray per nostril 1-2 times daily as needed. Proper nasal spray technique has been discussed and demonstrated.  I have also recommended nasal saline spray (i.e., Simply Saline) or nasal saline lavage (i.e., NeilMed) as needed and prior to medicated nasal sprays.  The risks and benefits of aeroallergen immunotherapy have been discussed. The patient is motivated to initiate immunotherapy to reduce symptoms and decrease medication requirement. Informed consent has been signed and allergen vaccine orders have been submitted. Medications will be decreased or discontinued as symptom relief from immunotherapy becomes evident.

## 2016-09-22 NOTE — Patient Instructions (Addendum)
Allergic rhinitis  Aeroallergen avoidance measures have been discussed and provided in written form.  A prescription has been provided for levocetirizine, 50m daily as needed.  A prescription has been provided for Nasonex nasal spray, one spray per nostril 1-2 times daily as needed. Proper nasal spray technique has been discussed and demonstrated.  I have also recommended nasal saline spray (i.e., Simply Saline) or nasal saline lavage (i.e., NeilMed) as needed and prior to medicated nasal sprays.  The risks and benefits of aeroallergen immunotherapy have been discussed. The patient is motivated to initiate immunotherapy to reduce symptoms and decrease medication requirement. Informed consent has been signed and allergen vaccine orders have been submitted. Medications will be decreased or discontinued as symptom relief from immunotherapy becomes evident.  Allergy with anaphylaxis due to food Food allergen skin tests were negative today despite a positive histamine control.  Based on her history, we will further clarify with blood work.  A lab order form has been provided for serum specific IgE against alpha gal, fish panel, shellfish panel, peanut, peanut components, tree nut panel, and spinach.  Until these allergies have been definitively ruled out, she will continue meticulous avoidance of these foods and have access to epinephrine autoinjectors.  Dermatitis, contact The patient's history suggests metal contact dermatitis.  She is due for total knee arthroplasty sometime over this next year.  She will return in the near future for metal patch testing.  Moderate persistent asthma  Restart montelukast 10 mg daily bedtime.    Continue Breo Ellipta 100-25 g, one inhalation daily, and albuterol every 4-6 hours as needed.  Subjective and objective measures of pulmonary function will be followed and the treatment plan will be adjusted accordingly.   Return in about 4 months (around  01/22/2017), or if symptoms worsen or fail to improve, for metal patch testing.  Reducing Pollen Exposure  The American Academy of Allergy, Asthma and Immunology suggests the following steps to reduce your exposure to pollen during allergy seasons.    1. Do not hang sheets or clothing out to dry; pollen may collect on these items. 2. Do not mow lawns or spend time around freshly cut grass; mowing stirs up pollen. 3. Keep windows closed at night.  Keep car windows closed while driving. 4. Minimize morning activities outdoors, a time when pollen counts are usually at their highest. 5. Stay indoors as much as possible when pollen counts or humidity is high and on windy days when pollen tends to remain in the air longer. 6. Use air conditioning when possible.  Many air conditioners have filters that trap the pollen spores. 7. Use a HEPA room air filter to remove pollen form the indoor air you breathe.   Control of House Dust Mite Allergen  House dust mites play a major role in allergic asthma and rhinitis.  They occur in environments with high humidity wherever human skin, the food for dust mites is found. High levels have been detected in dust obtained from mattresses, pillows, carpets, upholstered furniture, bed covers, clothes and soft toys.  The principal allergen of the house dust mite is found in its feces.  A gram of dust may contain 1,000 mites and 250,000 fecal particles.  Mite antigen is easily measured in the air during house cleaning activities.    1. Encase mattresses, including the box spring, and pillow, in an air tight cover.  Seal the zipper end of the encased mattresses with wide adhesive tape. 2. Wash the bedding in water of  130 degrees Farenheit weekly.  Avoid cotton comforters/quilts and flannel bedding: the most ideal bed covering is the dacron comforter. 3. Remove all upholstered furniture from the bedroom. 4. Remove carpets, carpet padding, rugs, and non-washable window  drapes from the bedroom.  Wash drapes weekly or use plastic window coverings. 5. Remove all non-washable stuffed toys from the bedroom.  Wash stuffed toys weekly. 6. Have the room cleaned frequently with a vacuum cleaner and a damp dust-mop.  The patient should not be in a room which is being cleaned and should wait 1 hour after cleaning before going into the room. 7. Close and seal all heating outlets in the bedroom.  Otherwise, the room will become filled with dust-laden air.  An electric heater can be used to heat the room. Reduce indoor humidity to less than 50%.  Do not use a humidifier.  Control of Dog or Cat Allergen  Avoidance is the best way to manage a dog or cat allergy. If you have a dog or cat and are allergic to dog or cats, consider removing the dog or cat from the home. If you have a dog or cat but don't want to find it a new home, or if your family wants a pet even though someone in the household is allergic, here are some strategies that may help keep symptoms at bay:  1. Keep the pet out of your bedroom and restrict it to only a few rooms. Be advised that keeping the dog or cat in only one room will not limit the allergens to that room. 2. Don't pet, hug or kiss the dog or cat; if you do, wash your hands with soap and water. 3. High-efficiency particulate air (HEPA) cleaners run continuously in a bedroom or living room can reduce allergen levels over time. 4. Regular use of a high-efficiency vacuum cleaner or a central vacuum can reduce allergen levels. 5. Giving your dog or cat a bath at least once a week can reduce airborne allergen.  Control of Mold Allergen  Mold and fungi can grow on a variety of surfaces provided certain temperature and moisture conditions exist.  Outdoor molds grow on plants, decaying vegetation and soil.  The major outdoor mold, Alternaria and Cladosporium, are found in very high numbers during hot and dry conditions.  Generally, a late Summer - Fall  peak is seen for common outdoor fungal spores.  Rain will temporarily lower outdoor mold spore count, but counts rise rapidly when the rainy period ends.  The most important indoor molds are Aspergillus and Penicillium.  Dark, humid and poorly ventilated basements are ideal sites for mold growth.  The next most common sites of mold growth are the bathroom and the kitchen.  Outdoor [employer] 2. Use air conditioning and keep windows closed 3. Avoid exposure to decaying vegetation. 4. Avoid leaf raking. 5. Avoid grain handling. 6. Consider wearing a face mask if working in moldy areas.  Indoor Mold Control 1. Maintain humidity below 50%. 2. Clean washable surfaces with 5% bleach solution. 3. Remove sources e.g. Contaminated carpets.  Control of Cockroach Allergen  Cockroach allergen has been identified as an important cause of acute attacks of asthma, especially in urban settings.  There are fifty-five species of cockroach that exist in the [place], however only three, the [place], [place] species produce allergen that can affect patients with Asthma.  Allergens can be obtained from fecal particles, egg casings and secretions from cockroaches.    1. Remove  food sources. 2. Reduce access to water. 3. Seal access and entry points. 4. Spray runways with 0.5-1% Diazinon or Chlorpyrifos 5. Blow boric acid power under stoves and refrigerator. 6. Place bait stations (hydramethylnon) at feeding sites.

## 2016-09-22 NOTE — Assessment & Plan Note (Signed)
The patient's history suggests metal contact dermatitis.  She is due for total knee arthroplasty sometime over this next year.  She will return in the near future for metal patch testing.

## 2016-09-22 NOTE — Assessment & Plan Note (Signed)
Food allergen skin tests were negative today despite a positive histamine control.  Based on her history, we will further clarify with blood work.  A lab order form has been provided for serum specific IgE against alpha gal, fish panel, shellfish panel, peanut, peanut components, tree nut panel, and spinach.  Until these allergies have been definitively ruled out, she will continue meticulous avoidance of these foods and have access to epinephrine autoinjectors.

## 2016-09-22 NOTE — Progress Notes (Signed)
Follow-up Note  RE: HONESTEE REVARD MRN: [id number] DOB: Aug 27, 1965 Date of Office Visit: 09/22/2016  Primary care provider: Reginia Naas, MD Referring provider: Carol Ada, MD  History of present illness: Monica Good is a 51 y.o. female with persistent asthma and allergic rhinitis presenting today for follow up and allergen skin testing.  She was last seen in this clinic in September 2017.  She reports that her asthma had been well-controlled, however she missed her montelukast doses over the past few days and has noticed mild increased chest tightness.  She is interested in reassessing her environmental and food allergen skin testing and would like to start aeroallergen immunotherapy injections to reduce symptoms and medication requirement.  Regarding history of food allergies, she states that she has experienced oral pruritus with the consumption of nuts and states that she has a food allergy to fish and shellfish.  Finally, she is interested in being tested to metals as she has experienced localized dermatitis one in contact with inexpensive jewelry.  She is scheduled to have total knee arthroplasty over the next year.   Assessment and plan: Allergic rhinitis  Aeroallergen avoidance measures have been discussed and provided in written form.  A prescription has been provided for levocetirizine, 71m daily as needed.  A prescription has been provided for Nasonex nasal spray, one spray per nostril 1-2 times daily as needed. Proper nasal spray technique has been discussed and demonstrated.  I have also recommended nasal saline spray (i.e., Simply Saline) or nasal saline lavage (i.e., NeilMed) as needed and prior to medicated nasal sprays.  The risks and benefits of aeroallergen immunotherapy have been discussed. The patient is motivated to initiate immunotherapy to reduce symptoms and decrease medication requirement. Informed consent has been signed and allergen vaccine  orders have been submitted. Medications will be decreased or discontinued as symptom relief from immunotherapy becomes evident.  Allergy with anaphylaxis due to food Food allergen skin tests were negative today despite a positive histamine control.  Based on her history, we will further clarify with blood work.  A lab order form has been provided for serum specific IgE against alpha gal, fish panel, shellfish panel, peanut, peanut components, tree nut panel, and spinach.  Until these allergies have been definitively ruled out, she will continue meticulous avoidance of these foods and have access to epinephrine autoinjectors.  Dermatitis, contact The patient's history suggests metal contact dermatitis.  She is due for total knee arthroplasty sometime over this next year.  She will return in the near future for metal patch testing.  Moderate persistent asthma  Restart montelukast 10 mg daily bedtime.    Continue Breo Ellipta 100-25 g, one inhalation daily, and albuterol every 4-6 hours as needed.  Subjective and objective measures of pulmonary function will be followed and the treatment plan will be adjusted accordingly.   Meds ordered this encounter  Medications  . levocetirizine (XYZAL) 5 MG tablet    Sig: Take 1 tablet (5 mg total) by mouth every evening.    Dispense:  30 tablet    Refill:  5  . mometasone (NASONEX) 50 MCG/ACT nasal spray    Sig: Place 2 sprays into the nose daily. Two sprays each in each nostril    Dispense:  17 g    Refill:  5  . montelukast (SINGULAIR) 10 MG tablet    Sig: Take 1 tablet (10 mg total) by mouth at bedtime.    Dispense:  30 tablet    Refill:  5    Diagnostics: Spirometry:  Normal with an FEV1 of 84% predicted.  Please see scanned spirometry results for details. Aeroallergen skin testing: Positive to grass pollens, weed pollens, tree pollens, molds, cat hair, dog epithelia, horse epithelia, cockroach antigen, and dust mite antigen. Food  allergen testing:  Negative despite a positive histamine control.    Physical examination: Blood pressure 128/76, pulse 70, temperature 98.3 F (36.8 C), temperature source Oral, resp. rate 16, height 5' 7.5" (1.715 m), weight 269 lb (122 kg), SpO2 96 %.  General: Alert, interactive, in no acute distress. HEENT: TMs pearly gray, turbinates moderately edematous without discharge, post-pharynx mildly erythematous. Neck: Supple without lymphadenopathy. Lungs: Clear to auscultation without wheezing, rhonchi or rales. CV: Normal S1, S2 without murmurs. Skin: Warm and dry, without lesions or rashes.  The following portions of the patient's history were reviewed and updated as appropriate: allergies, current medications, past family history, past medical history, past social history, past surgical history and problem list.  Allergies as of 09/22/2016      Reactions   Beef-derived Products Anaphylaxis   Lambs Quarters Anaphylaxis   Has not had but is warned by allergist that could have anaphylaxis.   Other Anaphylaxis   **SEAFOOD**   Pork-derived Products Anaphylaxis   Bee Venom Swelling   Latex       Medication List       Accurate as of 09/22/16  5:44 PM. Always use your most recent med list.          albuterol 108 (90 Base) MCG/ACT inhaler Commonly known as:  PROAIR HFA Inhale 2 puffs into the lungs every 4 (four) hours as needed for wheezing or shortness of breath.   aspirin 81 MG tablet Take 81 mg by mouth daily. TAKES 2 PILLS ONCE DAILY   BREO ELLIPTA 100-25 MCG/INH Aepb Generic drug:  fluticasone furoate-vilanterol Inhale 1 puff into the lungs daily.   clobetasol cream 0.05 % Commonly known as:  TEMOVATE APPLY TOPICALLY AA BID   EPINEPHrine 0.3 mg/0.3 mL Soaj injection Commonly known as:  EPIPEN 2-PAK USE AS DIRECTED FOR SEVERE ALLERGIC REACTION   levocetirizine 5 MG tablet Commonly known as:  XYZAL Take 1 tablet (5 mg total) by mouth every evening.   losartan  50 MG tablet Commonly known as:  COZAAR TK 1 T PO  BID   metFORMIN 500 MG tablet Commonly known as:  GLUCOPHAGE TK 2 TS PO BID WITH FOOD   MIRENA IU by Intrauterine route. IUD   mometasone 50 MCG/ACT nasal spray Commonly known as:  NASONEX Place 2 sprays into the nose daily. Two sprays each in each nostril   montelukast 10 MG tablet Commonly known as:  SINGULAIR Take 1 tablet (10 mg total) by mouth at bedtime.   multivitamin tablet Take by mouth daily.       Allergies  Allergen Reactions  . Beef-Derived Products Anaphylaxis  . Lambs Quarters Anaphylaxis    Has not had but is warned by allergist that could have anaphylaxis.  . Other Anaphylaxis    **SEAFOOD**  . Pork-Derived Products Anaphylaxis  . Bee Venom Swelling  . Latex    Review of systems: Review of systems negative except as noted in HPI / PMHx or noted below: Constitutional: Negative.  HENT: Negative.   Eyes: Negative.  Respiratory: Negative.   Cardiovascular: Negative.  Gastrointestinal: Negative.  Genitourinary: Negative.  Musculoskeletal: Negative.  Neurological: Negative.  Endo/Heme/Allergies: Negative.  Cutaneous: Negative.  Past Medical History:  Diagnosis Date  .  Hypertension    asthma related no medication taken  . Plantar fasciitis 2007  . Thigh DVT (deep venous thrombosis) (HCC) 1995    Family History  Problem Relation Age of Onset  . Arthritis Mother   . Hypertension Mother   . Diabetes Mother   . Allergic rhinitis Neg Hx   . Angioedema Neg Hx   . Eczema Neg Hx   . Asthma Neg Hx   . Immunodeficiency Neg Hx   . Urticaria Neg Hx     Social History   Social History  . Marital status: Married    Spouse name: N/A  . Number of children: N/A  . Years of education: N/A   Occupational History  . Not on file.   Social History Main Topics  . Smoking status: Never Smoker  . Smokeless tobacco: Never Used  . Alcohol use 0.0 oz/week     Comment: 1 glasses a month, occasionally   . Drug use: No  . Sexual activity: Yes    Partners: Male    Birth control/ protection: IUD, Other-see comments     Comment: vasectomy/Mirena IUD inserted 11-19-11   Other Topics Concern  . Not on file   Social History Narrative  . No narrative on file    I appreciate the opportunity to take part in Alona's care. Please do not hesitate to contact me with questions.  Sincerely,   R. Edgar Frisk, MD

## 2016-09-22 NOTE — Assessment & Plan Note (Signed)
   Restart montelukast 10 mg daily bedtime.    Continue Breo Ellipta 100-25 g, one inhalation daily, and albuterol every 4-6 hours as needed.  Subjective and objective measures of pulmonary function will be followed and the treatment plan will be adjusted accordingly.

## 2016-09-23 ENCOUNTER — Telehealth: Payer: Self-pay

## 2016-09-23 DIAGNOSIS — J3089 Other allergic rhinitis: Secondary | ICD-10-CM | POA: Diagnosis not present

## 2016-09-23 LAB — ALLERGY-SHELLFISH PANEL
Clams: <0.1 kU/L
Crab: <0.1 kU/L
Lobster: <0.1 kU/L
Shrimp IgE: <0.1 kU/L

## 2016-09-23 LAB — ALLERGY PANEL 18, NUT MIX GROUP
Almonds: 0.25 kU/L — ABNORMAL HIGH
Cashew IgE: <0.1 kU/L
Coconut: 0.1 kU/L — ABNORMAL HIGH
Hazelnut: 0.12 kU/L — ABNORMAL HIGH
Peanut IgE: 0.21 kU/L — ABNORMAL HIGH
Pecan Nut: <0.1 kU/L
Sesame Seed f10: 0.1 kU/L — ABNORMAL HIGH

## 2016-09-23 LAB — ALLERGEN, PEANUT COMPONENT PANEL
Ara h 1 (f422): <0.1 kU/L
Ara h 2 (f423): <0.1 kU/L
Ara h 3 (f424): <0.1 kU/L
Ara h 8 (f352): <0.1 kU/L
Ara h 9 (f427: 0.36 kU/L — ABNORMAL HIGH

## 2016-09-23 LAB — CP658 FISH PANEL
Allergen, Flounder, Rf337: <0.1 kU/L
Allergen, Salmon, f41: <0.1 kU/L
Allergen, Trout, f204: <0.1 kU/L
Allergen,Halibut,Rf303: <0.1 kU/L
Allergen,Mackerel,Rf206: <0.1 kU/L
Fish Cod: <0.1 kU/L
Tuna IgE: <0.1 kU/L

## 2016-09-23 LAB — ALLERGEN PISTACHIO F203: Pistachio  IgE: <0.1 kU/L

## 2016-09-23 LAB — ALLERGEN, SPINACH, F214: Allergen, Spinach, f214: <0.1 kU/L

## 2016-09-23 LAB — ALLERGEN WALNUT F256: Walnut: 0.3 kU/L — ABNORMAL HIGH

## 2016-09-23 LAB — ALLERGEN, BRAZIL NUT, F18: Brazil Nut: <0.1 kU/L

## 2016-09-23 NOTE — Telephone Encounter (Signed)
Let pt. Know that I called the lab and added the additional nuts (pistachio,walnut and [place] nut) to her nut panel that was ordered yesterday.

## 2016-09-23 NOTE — Progress Notes (Signed)
Vials to be made on 09-27-16

## 2016-09-23 NOTE — Telephone Encounter (Signed)
Left message for pt. To see if she went and got her lab work done bc if not I would just have added the extra nuts through the computer. I called [facility] and added the extra nuts.

## 2016-09-24 DIAGNOSIS — M5126 Other intervertebral disc displacement, lumbar region: Secondary | ICD-10-CM | POA: Diagnosis not present

## 2016-09-24 DIAGNOSIS — M7542 Impingement syndrome of left shoulder: Secondary | ICD-10-CM | POA: Diagnosis not present

## 2016-09-24 DIAGNOSIS — J3089 Other allergic rhinitis: Secondary | ICD-10-CM | POA: Diagnosis not present

## 2016-09-24 DIAGNOSIS — M7541 Impingement syndrome of right shoulder: Secondary | ICD-10-CM | POA: Diagnosis not present

## 2016-09-24 DIAGNOSIS — M1711 Unilateral primary osteoarthritis, right knee: Secondary | ICD-10-CM | POA: Diagnosis not present

## 2016-09-27 LAB — ALPHA-GAL PANEL
Beef IgE: 0.23 kU/L (ref <0.35)
Class: 1
Class: 1
Galactose-alpha-1,3-galactose IgE*: <0.1 kU/L (ref <0.35)
Lamb/Mutton IgE: 0.36 kU/L — ABNORMAL HIGH (ref <0.35)
Pork IgE: 0.49 kU/L — ABNORMAL HIGH (ref <0.35)

## 2016-10-08 ENCOUNTER — Ambulatory Visit (INDEPENDENT_AMBULATORY_CARE_PROVIDER_SITE_OTHER): Payer: BLUE CROSS/BLUE SHIELD | Admitting: *Deleted

## 2016-10-08 DIAGNOSIS — J309 Allergic rhinitis, unspecified: Secondary | ICD-10-CM | POA: Diagnosis not present

## 2016-10-08 NOTE — Progress Notes (Signed)
Immunotherapy   Patient Details  Name: Monica Good MRN: [id number] Date of Birth: Jun 14, 1966  10/08/2016  [name] : Patient started allergy injections.  Blue Vial 1:100,000 Grass/Weed/Tree/DM/Cat/Dog and Mold/CR. 0.05 of each vial given. Following schedule: A  Frequency: 1-2 times weekly with 72 hours in between. Epi-Pen: Patient does have Epipen and has been instructed on how to use. Consent signed and patient instructions given. Patient waited 60 minutes after injection with no reaction noted.    [name] 10/08/2016, 8:58 AM

## 2016-10-12 ENCOUNTER — Ambulatory Visit (INDEPENDENT_AMBULATORY_CARE_PROVIDER_SITE_OTHER): Payer: BLUE CROSS/BLUE SHIELD | Admitting: *Deleted

## 2016-10-12 DIAGNOSIS — J309 Allergic rhinitis, unspecified: Secondary | ICD-10-CM

## 2016-10-13 ENCOUNTER — Ambulatory Visit (INDEPENDENT_AMBULATORY_CARE_PROVIDER_SITE_OTHER): Payer: BLUE CROSS/BLUE SHIELD | Admitting: Allergy and Immunology

## 2016-10-13 ENCOUNTER — Encounter: Payer: Self-pay | Admitting: Allergy and Immunology

## 2016-10-13 DIAGNOSIS — L230 Allergic contact dermatitis due to metals: Secondary | ICD-10-CM | POA: Diagnosis not present

## 2016-10-13 NOTE — Addendum Note (Signed)
Addended by: Gerre Pebbles A on: 10/13/2016 04:55 PM   Modules accepted: Orders

## 2016-10-13 NOTE — Progress Notes (Signed)
    Follow-up Note  RE: Monica Good MRN: [id number] DOB: 1966/06/13 Date of Office Visit: 10/13/2016  Primary care provider: Reginia Naas, MD Referring provider: Carol Ada, MD   [name] returns to the office today for patch test placment, given suspected history of contact dermatitis to metal and possibly other substances as well. She is scheduled for total knee arthroplasty in the upcoming year.    Plan:   Patch tests have been placed and instructions have been provided regarding keeping the patches clean and dry.   Follow-up with 2 days for patch test reading/interpretation.

## 2016-10-13 NOTE — Assessment & Plan Note (Signed)
Monica Good's history suggests contact dermatitis to metal and possibly other substances as well.  Patch tests have been placed and instructions have been provided regarding keeping the patches clean and dry.   Follow-up with 2 days for patch test reading/interpretation.

## 2016-10-13 NOTE — Patient Instructions (Signed)
Dermatitis, contact Kaelei's history suggests contact dermatitis to metal and possibly other substances as well.  Patch tests have been placed and instructions have been provided regarding keeping the patches clean and dry.   Follow-up with 2 days for patch test reading/interpretation.   Return in about 2 days (around 10/15/2016).

## 2016-10-14 ENCOUNTER — Ambulatory Visit (INDEPENDENT_AMBULATORY_CARE_PROVIDER_SITE_OTHER): Payer: BLUE CROSS/BLUE SHIELD | Admitting: *Deleted

## 2016-10-14 DIAGNOSIS — J309 Allergic rhinitis, unspecified: Secondary | ICD-10-CM

## 2016-10-15 ENCOUNTER — Ambulatory Visit: Payer: BLUE CROSS/BLUE SHIELD | Admitting: Allergy & Immunology

## 2016-10-15 DIAGNOSIS — L230 Allergic contact dermatitis due to metals: Secondary | ICD-10-CM

## 2016-10-15 NOTE — Progress Notes (Signed)
    Follow-up Note  RE: CURLEY FAYETTE MRN: [id number] DOB: [birth year] Date of Office Visit: 10/15/2016  Primary care provider: Reginia Naas, MD Referring provider: Carol Ada, MD   Twilia returns to the office today for the initial patch test interpretation, given suspected history of contact dermatitis.    Diagnostics:  TRUE TEST 48 hour reading: mild reaction to #18 (Quaternium-15) and #21 (Formaldehyde), moderate reaction to #17 (Cl+Me-Isothiazolinone). There were questionable reactions to #6 and #28.   Metal testing:mild positive reaction to titanium bolt and moderate positive reaction to copper.   Plan:  Allergic contact dermatitis  The patient has been provided detailed information regarding the substances she is sensitive to, as well as products containing the substances.  Meticulous avoidance of these substances is recommended. If avoidance is not possible, the use of barrier creams or lotions is recommended. If symptoms persist or progress despite meticulous avoidance of #18 (Quaternium-15) and #21 (Formaldehyde), moderate reaction to #17 (Cl+Me-Isothiazolinone), dermatology evaluation may be warranted. I did NOT provide information on the questionable reactions.  Salvatore Marvel, MD [facility]

## 2016-10-18 ENCOUNTER — Ambulatory Visit: Payer: BLUE CROSS/BLUE SHIELD | Admitting: Pediatrics

## 2016-10-18 DIAGNOSIS — L253 Unspecified contact dermatitis due to other chemical products: Secondary | ICD-10-CM

## 2016-10-18 NOTE — Patient Instructions (Signed)
Return in 2 days for the final reading on mental testing

## 2016-10-18 NOTE — Progress Notes (Signed)
  [place] [id number] Dept: [id number]  FOLLOW UP NOTE  Patient ID: Monica Good, female    DOB: 06/16/1965  Age: 51 y.o. MRN: [id number] Date of Office Visit: 10/18/2016  Assessment  Chief Complaint: No chief complaint on file.  HPI [name] presents for reading of the patch testing. Her patch testing to nickel and titanium is negative. Patch testing to other metals was negative. She is showing some reactivity to balsam of [place] and disperse blue. She was given information regarding these  chemicals. She will continue avoiding formaldehyde, Quaternium 15 andClplus Me- isothiazolinone . She was given handouts last week   Drug Allergies:  Allergies  Allergen Reactions  . Beef-Derived Products Anaphylaxis  . Lambs Quarters Anaphylaxis    Has not had but is warned by allergist that could have anaphylaxis.  . Other Anaphylaxis    **SEAFOOD**  . Pork-Derived Products Anaphylaxis  . Bee Venom Swelling  . Latex     Physical Exam: There were no vitals taken for this visit.   Physical Exam  Diagnostics:    Assessment and Plan: No diagnosis found.  No orders of the defined types were placed in this encounter.   There are no Patient Instructions on file for this visit.  No Follow-up on file.    Thank you for the opportunity to care for this patient.  Please do not hesitate to contact me with questions.  Penne Lash, M.D.  [facility] [address] [place], [place] [address] [phone]

## 2016-10-19 ENCOUNTER — Ambulatory Visit (INDEPENDENT_AMBULATORY_CARE_PROVIDER_SITE_OTHER): Payer: BLUE CROSS/BLUE SHIELD | Admitting: *Deleted

## 2016-10-19 DIAGNOSIS — J309 Allergic rhinitis, unspecified: Secondary | ICD-10-CM

## 2016-10-20 ENCOUNTER — Encounter: Payer: Self-pay | Admitting: Allergy and Immunology

## 2016-10-20 ENCOUNTER — Ambulatory Visit: Payer: BLUE CROSS/BLUE SHIELD | Admitting: Allergy and Immunology

## 2016-10-20 DIAGNOSIS — L253 Unspecified contact dermatitis due to other chemical products: Secondary | ICD-10-CM

## 2016-10-20 NOTE — Progress Notes (Signed)
    Follow-up Note  RE: Monica Good MRN: [id number] DOB: [birth year] Date of Office Visit: 10/20/2016  Primary care provider: Carol Ada, MD Referring provider: Carol Ada, MD   Monica Good returns to the office today for the final patch test interpretation, given suspected history of contact dermatitis.    Diagnostics:  TRUE TEST 1 week reading: Moderate reaction to #18 (Quaternium-15), #21 (Formaldehyde), and #17 (Cl+Me-Isothiazolinone).   On the previous interpretation she was somewhat reactive to balsam of [place] and disperse blue  Assessment and Plan:   Allergic contact dermatitis  The patient has been provided detailed information regarding the substances she is sensitive to, as well as products containing the substances.  Meticulous avoidance of these substances is recommended. If avoidance is not possible, the use of barrier creams or lotions is recommended.  If symptoms persist or progress despite meticulous avoidance of these substances, dermatology evaluation may be warranted.  Her history suggests nickel sensitivity, however metal patch tests were negative.  I have recommended wearing jewelry containing nickel between now and the time that she has arthroplasty.  She will inform me if she has reactions to the jewelry.

## 2016-10-20 NOTE — Assessment & Plan Note (Signed)
   The patient has been provided detailed information regarding the substances she is sensitive to, as well as products containing the substances.  Meticulous avoidance of these substances is recommended. If avoidance is not possible, the use of barrier creams or lotions is recommended.  If symptoms persist or progress despite meticulous avoidance of these substances, dermatology evaluation may be warranted.  Her history suggests nickel sensitivity, however metal patch tests were negative.  I have recommended wearing jewelry containing nickel between now and the time that she has arthroplasty.  She will inform me if she has reactions to the jewelry.

## 2016-10-20 NOTE — Patient Instructions (Signed)
Contact dermatitis  The patient has been provided detailed information regarding the substances she is sensitive to, as well as products containing the substances.  Meticulous avoidance of these substances is recommended. If avoidance is not possible, the use of barrier creams or lotions is recommended.  If symptoms persist or progress despite meticulous avoidance of these substances, dermatology evaluation may be warranted.  Her history suggests nickel sensitivity, however metal patch tests were negative.  I have recommended wearing jewelry containing nickel between now and the time that she has arthroplasty.  She will inform me if she has reactions to the jewelry.   Return if symptoms worsen or fail to improve.

## 2016-10-21 ENCOUNTER — Ambulatory Visit (INDEPENDENT_AMBULATORY_CARE_PROVIDER_SITE_OTHER): Payer: BLUE CROSS/BLUE SHIELD | Admitting: *Deleted

## 2016-10-21 DIAGNOSIS — J309 Allergic rhinitis, unspecified: Secondary | ICD-10-CM

## 2016-10-26 ENCOUNTER — Ambulatory Visit (INDEPENDENT_AMBULATORY_CARE_PROVIDER_SITE_OTHER): Payer: BLUE CROSS/BLUE SHIELD | Admitting: *Deleted

## 2016-10-26 DIAGNOSIS — J309 Allergic rhinitis, unspecified: Secondary | ICD-10-CM

## 2016-10-28 ENCOUNTER — Ambulatory Visit (INDEPENDENT_AMBULATORY_CARE_PROVIDER_SITE_OTHER): Payer: BLUE CROSS/BLUE SHIELD | Admitting: *Deleted

## 2016-10-28 DIAGNOSIS — J309 Allergic rhinitis, unspecified: Secondary | ICD-10-CM

## 2016-11-01 ENCOUNTER — Ambulatory Visit (INDEPENDENT_AMBULATORY_CARE_PROVIDER_SITE_OTHER): Payer: BLUE CROSS/BLUE SHIELD | Admitting: *Deleted

## 2016-11-01 DIAGNOSIS — J309 Allergic rhinitis, unspecified: Secondary | ICD-10-CM

## 2016-11-05 ENCOUNTER — Ambulatory Visit (INDEPENDENT_AMBULATORY_CARE_PROVIDER_SITE_OTHER): Payer: BLUE CROSS/BLUE SHIELD

## 2016-11-05 DIAGNOSIS — M7541 Impingement syndrome of right shoulder: Secondary | ICD-10-CM | POA: Diagnosis not present

## 2016-11-05 DIAGNOSIS — J309 Allergic rhinitis, unspecified: Secondary | ICD-10-CM

## 2016-11-05 DIAGNOSIS — M5126 Other intervertebral disc displacement, lumbar region: Secondary | ICD-10-CM | POA: Diagnosis not present

## 2016-11-05 DIAGNOSIS — M7542 Impingement syndrome of left shoulder: Secondary | ICD-10-CM | POA: Diagnosis not present

## 2016-11-05 DIAGNOSIS — M1711 Unilateral primary osteoarthritis, right knee: Secondary | ICD-10-CM | POA: Diagnosis not present

## 2016-11-09 ENCOUNTER — Ambulatory Visit (INDEPENDENT_AMBULATORY_CARE_PROVIDER_SITE_OTHER): Payer: BLUE CROSS/BLUE SHIELD | Admitting: *Deleted

## 2016-11-09 DIAGNOSIS — J309 Allergic rhinitis, unspecified: Secondary | ICD-10-CM

## 2016-11-11 ENCOUNTER — Ambulatory Visit (INDEPENDENT_AMBULATORY_CARE_PROVIDER_SITE_OTHER): Payer: BLUE CROSS/BLUE SHIELD | Admitting: *Deleted

## 2016-11-11 DIAGNOSIS — J309 Allergic rhinitis, unspecified: Secondary | ICD-10-CM

## 2016-11-16 ENCOUNTER — Ambulatory Visit (INDEPENDENT_AMBULATORY_CARE_PROVIDER_SITE_OTHER): Payer: BLUE CROSS/BLUE SHIELD | Admitting: *Deleted

## 2016-11-16 DIAGNOSIS — J309 Allergic rhinitis, unspecified: Secondary | ICD-10-CM | POA: Diagnosis not present

## 2016-11-18 ENCOUNTER — Ambulatory Visit (INDEPENDENT_AMBULATORY_CARE_PROVIDER_SITE_OTHER): Payer: BLUE CROSS/BLUE SHIELD | Admitting: *Deleted

## 2016-11-18 DIAGNOSIS — J309 Allergic rhinitis, unspecified: Secondary | ICD-10-CM

## 2016-11-23 ENCOUNTER — Ambulatory Visit (INDEPENDENT_AMBULATORY_CARE_PROVIDER_SITE_OTHER): Payer: BLUE CROSS/BLUE SHIELD | Admitting: *Deleted

## 2016-11-23 DIAGNOSIS — J309 Allergic rhinitis, unspecified: Secondary | ICD-10-CM | POA: Diagnosis not present

## 2016-11-25 ENCOUNTER — Encounter: Payer: Self-pay | Admitting: Obstetrics and Gynecology

## 2016-11-25 ENCOUNTER — Ambulatory Visit (INDEPENDENT_AMBULATORY_CARE_PROVIDER_SITE_OTHER): Payer: BLUE CROSS/BLUE SHIELD | Admitting: *Deleted

## 2016-11-25 ENCOUNTER — Ambulatory Visit (INDEPENDENT_AMBULATORY_CARE_PROVIDER_SITE_OTHER): Payer: BLUE CROSS/BLUE SHIELD | Admitting: Obstetrics and Gynecology

## 2016-11-25 VITALS — BP 118/68 | HR 80 | Resp 16 | Ht 67.25 in | Wt 278.0 lb

## 2016-11-25 DIAGNOSIS — J309 Allergic rhinitis, unspecified: Secondary | ICD-10-CM

## 2016-11-25 DIAGNOSIS — Z23 Encounter for immunization: Secondary | ICD-10-CM | POA: Diagnosis not present

## 2016-11-25 DIAGNOSIS — Z01419 Encounter for gynecological examination (general) (routine) without abnormal findings: Secondary | ICD-10-CM | POA: Diagnosis not present

## 2016-11-25 DIAGNOSIS — N951 Menopausal and female climacteric states: Secondary | ICD-10-CM | POA: Diagnosis not present

## 2016-11-25 NOTE — Progress Notes (Signed)
51 y.o. [name] Married Caucasian female here for annual exam.    Due for a Mirena IUD exchange. No bleeding since just after the IUD was placed.  Hot flashes come and go.   May have a knee replacement.   Labs done with her PCP.   PCP:   Carol Ada, MD  No LMP recorded. Patient is not currently having periods (Reason: IUD).           Sexually active: Yes.    The current method of family planning is vasectomy and Mirena placed 11/19/11.    Exercising: No.  The patient does not participate in regular exercise at present. Smoker:  no  Health Maintenance: Pap:  11/07/15 Pap and HR HPV negative History of abnormal Pap:  no MMG:  08/04/16 BIRADS 1 negative Colonoscopy:  09-22-13 polyps with dr Altamese Dilling magod f/u 2020 BMD:   n/a  Result  n/a TDaP:  2008 Gardasil:   N/a HIV: HIV done years ago with pregnancy -- Negative Hep C: not done..  NA. Screening Labs:  Hb today: PCP takes care of labs   reports that she has never smoked. She has never used smokeless tobacco. She reports that she drinks alcohol. She reports that she does not use drugs.  Past Medical History:  Diagnosis Date  . Hypertension    asthma related no medication taken  . Plantar fasciitis 2007  . Thigh DVT (deep venous thrombosis) ([place]) 1995    Past Surgical History:  Procedure Laterality Date  . HAND SURGERY Right 2006  . KNEE ARTHROSCOPY Left 04/2010  . LAPAROSCOPY  1991  . SHOULDER ARTHROSCOPY  2004    Current Outpatient Prescriptions  Medication Sig Dispense Refill  . albuterol (PROAIR HFA) 108 (90 Base) MCG/ACT inhaler Inhale 2 puffs into the lungs every 4 (four) hours as needed for wheezing or shortness of breath. 1 Inhaler 3  . aspirin 81 MG tablet Take 81 mg by mouth daily. TAKES 2 PILLS ONCE DAILY    . BREO ELLIPTA 100-25 MCG/INH AEPB Inhale 1 puff into the lungs daily. 1 each 2  . clobetasol cream (TEMOVATE) 0.05 % APPLY TOPICALLY AA BID  0  . EPINEPHrine (EPIPEN 2-PAK) 0.3 mg/0.3 mL IJ SOAJ  injection USE AS DIRECTED FOR SEVERE ALLERGIC REACTION 2 Device 2  . Levonorgestrel (MIRENA IU) by Intrauterine route. IUD    . losartan (COZAAR) 50 MG tablet TK 1 T PO  BID  5  . metFORMIN (GLUCOPHAGE) 1000 MG tablet Take 1 tablet by mouth 2 (two) times daily.  3  . mometasone (NASONEX) 50 MCG/ACT nasal spray Place 2 sprays into the nose daily. Two sprays each in each nostril 17 g 5  . montelukast (SINGULAIR) 10 MG tablet Take 1 tablet (10 mg total) by mouth at bedtime. 30 tablet 5  . Multiple Vitamin (MULTIVITAMIN) tablet Take by mouth daily.     No current facility-administered medications for this visit.     Family History  Problem Relation Age of Onset  . Arthritis Mother   . Hypertension Mother   . Diabetes Mother   . Allergic rhinitis Neg Hx   . Angioedema Neg Hx   . Eczema Neg Hx   . Asthma Neg Hx   . Immunodeficiency Neg Hx   . Urticaria Neg Hx     ROS:  Pertinent items are noted in HPI.  Otherwise, a comprehensive ROS was negative.  Exam:   BP 118/68   Pulse 80   Resp 16  Ht 5' 7.25" (1.708 m)   Wt 278 lb (126.1 kg)   BMI 43.22 kg/m     General appearance: alert, cooperative and appears stated age Head: Normocephalic, without obvious abnormality, atraumatic Neck: no adenopathy, supple, symmetrical, trachea midline and thyroid normal to inspection and palpation Lungs: clear to auscultation bilaterally Breasts: normal appearance, no masses or tenderness, No nipple retraction or dimpling, No nipple discharge or bleeding, No axillary or supraclavicular adenopathy Heart: regular rate and rhythm Abdomen: soft, non-tender; no masses, no organomegaly Extremities: extremities normal, atraumatic, no cyanosis or edema Skin: Skin color, texture, turgor normal. No rashes or lesions Lymph nodes: Cervical, supraclavicular, and axillary nodes normal. No abnormal inguinal nodes palpated Neurologic: Grossly normal  Pelvic: External genitalia:  no lesions              Urethra:   normal appearing urethra with no masses, tenderness or lesions              Bartholins and Skenes: normal                 Vagina: normal appearing vagina with normal color and discharge, no lesions              Cervix: no lesions.  IUD srings noted.              Pap taken: No. Bimanual Exam:  Uterus:  normal size, contour, position, consistency, mobility, non-tender              Adnexa: no mass, fullness, tenderness              Rectal exam: Yes.  .  Confirms.              Anus:  normal sphincter tone, no lesions  Chaperone was present for exam.  Assessment:   Well woman visit with normal exam. Hx DVT.  Menopausal symptoms.  Not estrogen candidate. Expiring Mirena IUD.  Plan: Mammogram screening discussed. Recommended self breast awareness. Pap and HR HPV as above. Guidelines for Calcium, Vitamin D, regular exercise program including cardiovascular and weight bearing exercise. She is working on weight loss in preparation for knee surgery. FSH, estradiol.  Will determine need for another Mirena after hormones are back.  We did discuss potential prolonged use of the Mirena if she is perimenopausal. TDap.  Follow up annually and prn.   After visit summary provided.

## 2016-11-25 NOTE — Patient Instructions (Signed)

## 2016-11-26 LAB — ESTRADIOL: Estradiol: 13.4 pg/mL

## 2016-11-26 LAB — FOLLICLE STIMULATING HORMONE: FSH: 29.1 m[IU]/mL

## 2016-11-30 ENCOUNTER — Ambulatory Visit (INDEPENDENT_AMBULATORY_CARE_PROVIDER_SITE_OTHER): Payer: BLUE CROSS/BLUE SHIELD | Admitting: *Deleted

## 2016-11-30 DIAGNOSIS — J309 Allergic rhinitis, unspecified: Secondary | ICD-10-CM

## 2016-12-02 ENCOUNTER — Ambulatory Visit (INDEPENDENT_AMBULATORY_CARE_PROVIDER_SITE_OTHER): Payer: BLUE CROSS/BLUE SHIELD

## 2016-12-02 DIAGNOSIS — J309 Allergic rhinitis, unspecified: Secondary | ICD-10-CM

## 2016-12-07 ENCOUNTER — Ambulatory Visit (INDEPENDENT_AMBULATORY_CARE_PROVIDER_SITE_OTHER): Payer: BLUE CROSS/BLUE SHIELD | Admitting: *Deleted

## 2016-12-07 DIAGNOSIS — J309 Allergic rhinitis, unspecified: Secondary | ICD-10-CM

## 2016-12-09 ENCOUNTER — Ambulatory Visit (INDEPENDENT_AMBULATORY_CARE_PROVIDER_SITE_OTHER): Payer: BLUE CROSS/BLUE SHIELD | Admitting: *Deleted

## 2016-12-09 DIAGNOSIS — J309 Allergic rhinitis, unspecified: Secondary | ICD-10-CM

## 2016-12-10 DIAGNOSIS — M7542 Impingement syndrome of left shoulder: Secondary | ICD-10-CM | POA: Diagnosis not present

## 2016-12-10 DIAGNOSIS — M7541 Impingement syndrome of right shoulder: Secondary | ICD-10-CM | POA: Diagnosis not present

## 2016-12-10 DIAGNOSIS — M1711 Unilateral primary osteoarthritis, right knee: Secondary | ICD-10-CM | POA: Diagnosis not present

## 2016-12-10 DIAGNOSIS — M5126 Other intervertebral disc displacement, lumbar region: Secondary | ICD-10-CM | POA: Diagnosis not present

## 2016-12-14 ENCOUNTER — Ambulatory Visit (INDEPENDENT_AMBULATORY_CARE_PROVIDER_SITE_OTHER): Payer: BLUE CROSS/BLUE SHIELD | Admitting: *Deleted

## 2016-12-14 DIAGNOSIS — J309 Allergic rhinitis, unspecified: Secondary | ICD-10-CM

## 2016-12-16 ENCOUNTER — Ambulatory Visit (INDEPENDENT_AMBULATORY_CARE_PROVIDER_SITE_OTHER): Payer: BLUE CROSS/BLUE SHIELD | Admitting: *Deleted

## 2016-12-16 DIAGNOSIS — J309 Allergic rhinitis, unspecified: Secondary | ICD-10-CM | POA: Diagnosis not present

## 2016-12-21 ENCOUNTER — Ambulatory Visit (INDEPENDENT_AMBULATORY_CARE_PROVIDER_SITE_OTHER): Payer: BLUE CROSS/BLUE SHIELD | Admitting: *Deleted

## 2016-12-21 DIAGNOSIS — J309 Allergic rhinitis, unspecified: Secondary | ICD-10-CM

## 2016-12-23 ENCOUNTER — Ambulatory Visit (INDEPENDENT_AMBULATORY_CARE_PROVIDER_SITE_OTHER): Payer: BLUE CROSS/BLUE SHIELD | Admitting: *Deleted

## 2016-12-23 DIAGNOSIS — J309 Allergic rhinitis, unspecified: Secondary | ICD-10-CM

## 2016-12-28 ENCOUNTER — Ambulatory Visit (INDEPENDENT_AMBULATORY_CARE_PROVIDER_SITE_OTHER): Payer: BLUE CROSS/BLUE SHIELD | Admitting: *Deleted

## 2016-12-28 DIAGNOSIS — J309 Allergic rhinitis, unspecified: Secondary | ICD-10-CM

## 2016-12-30 ENCOUNTER — Ambulatory Visit (INDEPENDENT_AMBULATORY_CARE_PROVIDER_SITE_OTHER): Payer: BLUE CROSS/BLUE SHIELD | Admitting: *Deleted

## 2016-12-30 DIAGNOSIS — J309 Allergic rhinitis, unspecified: Secondary | ICD-10-CM | POA: Diagnosis not present

## 2017-01-10 DIAGNOSIS — M1711 Unilateral primary osteoarthritis, right knee: Secondary | ICD-10-CM | POA: Diagnosis not present

## 2017-01-10 DIAGNOSIS — E559 Vitamin D deficiency, unspecified: Secondary | ICD-10-CM | POA: Diagnosis not present

## 2017-01-10 DIAGNOSIS — M7542 Impingement syndrome of left shoulder: Secondary | ICD-10-CM | POA: Diagnosis not present

## 2017-01-10 DIAGNOSIS — E039 Hypothyroidism, unspecified: Secondary | ICD-10-CM | POA: Diagnosis not present

## 2017-01-10 DIAGNOSIS — N951 Menopausal and female climacteric states: Secondary | ICD-10-CM | POA: Diagnosis not present

## 2017-01-10 DIAGNOSIS — M7541 Impingement syndrome of right shoulder: Secondary | ICD-10-CM | POA: Diagnosis not present

## 2017-01-10 DIAGNOSIS — M5126 Other intervertebral disc displacement, lumbar region: Secondary | ICD-10-CM | POA: Diagnosis not present

## 2017-01-10 DIAGNOSIS — R739 Hyperglycemia, unspecified: Secondary | ICD-10-CM | POA: Diagnosis not present

## 2017-01-11 ENCOUNTER — Ambulatory Visit (INDEPENDENT_AMBULATORY_CARE_PROVIDER_SITE_OTHER): Payer: BLUE CROSS/BLUE SHIELD | Admitting: *Deleted

## 2017-01-11 DIAGNOSIS — J309 Allergic rhinitis, unspecified: Secondary | ICD-10-CM | POA: Diagnosis not present

## 2017-01-13 ENCOUNTER — Ambulatory Visit (INDEPENDENT_AMBULATORY_CARE_PROVIDER_SITE_OTHER): Payer: BLUE CROSS/BLUE SHIELD | Admitting: *Deleted

## 2017-01-13 DIAGNOSIS — J309 Allergic rhinitis, unspecified: Secondary | ICD-10-CM | POA: Diagnosis not present

## 2017-01-18 ENCOUNTER — Ambulatory Visit (INDEPENDENT_AMBULATORY_CARE_PROVIDER_SITE_OTHER): Payer: BLUE CROSS/BLUE SHIELD | Admitting: *Deleted

## 2017-01-18 DIAGNOSIS — J309 Allergic rhinitis, unspecified: Secondary | ICD-10-CM | POA: Diagnosis not present

## 2017-01-20 ENCOUNTER — Ambulatory Visit (INDEPENDENT_AMBULATORY_CARE_PROVIDER_SITE_OTHER): Payer: BLUE CROSS/BLUE SHIELD | Admitting: *Deleted

## 2017-01-20 DIAGNOSIS — J309 Allergic rhinitis, unspecified: Secondary | ICD-10-CM | POA: Diagnosis not present

## 2017-01-25 ENCOUNTER — Ambulatory Visit (INDEPENDENT_AMBULATORY_CARE_PROVIDER_SITE_OTHER): Payer: BLUE CROSS/BLUE SHIELD | Admitting: *Deleted

## 2017-01-25 DIAGNOSIS — J309 Allergic rhinitis, unspecified: Secondary | ICD-10-CM | POA: Diagnosis not present

## 2017-01-27 ENCOUNTER — Ambulatory Visit (INDEPENDENT_AMBULATORY_CARE_PROVIDER_SITE_OTHER): Payer: BLUE CROSS/BLUE SHIELD | Admitting: *Deleted

## 2017-01-27 DIAGNOSIS — J309 Allergic rhinitis, unspecified: Secondary | ICD-10-CM | POA: Diagnosis not present

## 2017-02-01 ENCOUNTER — Ambulatory Visit (INDEPENDENT_AMBULATORY_CARE_PROVIDER_SITE_OTHER): Payer: BLUE CROSS/BLUE SHIELD | Admitting: *Deleted

## 2017-02-01 DIAGNOSIS — J309 Allergic rhinitis, unspecified: Secondary | ICD-10-CM | POA: Diagnosis not present

## 2017-02-08 ENCOUNTER — Ambulatory Visit (INDEPENDENT_AMBULATORY_CARE_PROVIDER_SITE_OTHER): Payer: BLUE CROSS/BLUE SHIELD | Admitting: *Deleted

## 2017-02-08 DIAGNOSIS — J309 Allergic rhinitis, unspecified: Secondary | ICD-10-CM

## 2017-02-10 ENCOUNTER — Ambulatory Visit (INDEPENDENT_AMBULATORY_CARE_PROVIDER_SITE_OTHER): Payer: BLUE CROSS/BLUE SHIELD | Admitting: *Deleted

## 2017-02-10 DIAGNOSIS — J309 Allergic rhinitis, unspecified: Secondary | ICD-10-CM | POA: Diagnosis not present

## 2017-02-15 ENCOUNTER — Ambulatory Visit (INDEPENDENT_AMBULATORY_CARE_PROVIDER_SITE_OTHER): Payer: BLUE CROSS/BLUE SHIELD

## 2017-02-15 DIAGNOSIS — J309 Allergic rhinitis, unspecified: Secondary | ICD-10-CM | POA: Diagnosis not present

## 2017-02-16 DIAGNOSIS — M7542 Impingement syndrome of left shoulder: Secondary | ICD-10-CM | POA: Diagnosis not present

## 2017-02-16 DIAGNOSIS — M5126 Other intervertebral disc displacement, lumbar region: Secondary | ICD-10-CM | POA: Diagnosis not present

## 2017-02-16 DIAGNOSIS — M1711 Unilateral primary osteoarthritis, right knee: Secondary | ICD-10-CM | POA: Diagnosis not present

## 2017-02-16 DIAGNOSIS — M7541 Impingement syndrome of right shoulder: Secondary | ICD-10-CM | POA: Diagnosis not present

## 2017-02-17 DIAGNOSIS — L821 Other seborrheic keratosis: Secondary | ICD-10-CM | POA: Diagnosis not present

## 2017-02-17 DIAGNOSIS — L720 Epidermal cyst: Secondary | ICD-10-CM | POA: Diagnosis not present

## 2017-02-17 DIAGNOSIS — M1711 Unilateral primary osteoarthritis, right knee: Secondary | ICD-10-CM | POA: Diagnosis not present

## 2017-02-21 ENCOUNTER — Other Ambulatory Visit: Payer: Self-pay

## 2017-02-21 DIAGNOSIS — J4540 Moderate persistent asthma, uncomplicated: Secondary | ICD-10-CM

## 2017-02-21 MED ORDER — BREO ELLIPTA 100-25 MCG/INH IN AEPB: 1.0000 | INHALATION_SPRAY | Freq: Every day | RESPIRATORY_TRACT | 2 refills | Status: DC

## 2017-02-22 ENCOUNTER — Ambulatory Visit (INDEPENDENT_AMBULATORY_CARE_PROVIDER_SITE_OTHER): Payer: BLUE CROSS/BLUE SHIELD | Admitting: *Deleted

## 2017-02-22 DIAGNOSIS — J309 Allergic rhinitis, unspecified: Secondary | ICD-10-CM | POA: Diagnosis not present

## 2017-03-01 ENCOUNTER — Ambulatory Visit (INDEPENDENT_AMBULATORY_CARE_PROVIDER_SITE_OTHER): Payer: BLUE CROSS/BLUE SHIELD

## 2017-03-01 DIAGNOSIS — J309 Allergic rhinitis, unspecified: Secondary | ICD-10-CM | POA: Diagnosis not present

## 2017-03-02 ENCOUNTER — Encounter: Payer: Self-pay | Admitting: Allergy and Immunology

## 2017-03-02 ENCOUNTER — Ambulatory Visit (INDEPENDENT_AMBULATORY_CARE_PROVIDER_SITE_OTHER): Payer: BLUE CROSS/BLUE SHIELD | Admitting: Allergy and Immunology

## 2017-03-02 VITALS — BP 112/64 | HR 76 | Resp 20

## 2017-03-02 DIAGNOSIS — J3089 Other allergic rhinitis: Secondary | ICD-10-CM | POA: Diagnosis not present

## 2017-03-02 DIAGNOSIS — T7840XA Allergy, unspecified, initial encounter: Secondary | ICD-10-CM

## 2017-03-02 DIAGNOSIS — J4540 Moderate persistent asthma, uncomplicated: Secondary | ICD-10-CM | POA: Diagnosis not present

## 2017-03-02 MED ORDER — PREDNISONE 1 MG PO TABS: 10.0000 mg | ORAL_TABLET | Freq: Every day | ORAL | Status: AC

## 2017-03-02 NOTE — Patient Instructions (Signed)
Allergic reaction Immunotherapy injection reaction.  Prednisone has been provided 20 mg 3 days.  Immunotherapy dose for the pollens/dander vial will be decreased to 0.05 mL and buildup will be resumed at 0.05 mL increments as tolerated.  Monica Good will take a second generation antihistamine prior to immunotherapy injections and have epinephrine autoinjectors on hand.  Allergic rhinitis  Continue appropriate allergen avoidance measures, aeroallergen immunotherapy (see above), montelukast 10 mg daily, and second generation antihistamine as needed.  Medications will be decreased or discontinued as symptom relief from immunotherapy becomes evident.  Moderate persistent asthma Stable.    Continue Breo Ellipta 100-25 g, one inhalation daily, montelukast 10 mg daily bedtime, and albuterol every 4-6 hours as needed.  Subjective and objective measures of pulmonary function will be followed and the treatment plan will be adjusted accordingly.   Return in about 5 months (around 08/02/2017), or if symptoms worsen or fail to improve.

## 2017-03-02 NOTE — Assessment & Plan Note (Signed)
   Continue appropriate allergen avoidance measures, aeroallergen immunotherapy (see above), montelukast 10 mg daily, and second generation antihistamine as needed.  Medications will be decreased or discontinued as symptom relief from immunotherapy becomes evident.

## 2017-03-02 NOTE — Progress Notes (Signed)
Follow-up Note  RE: Monica Good MRN: [id number] DOB: May 15, 1966 Date of Office Visit: 03/02/2017  Primary care provider: Carol Ada, MD Referring provider: Carol Ada, MD  History of present illness: Monica Good is a 51 y.o. female with persistent asthma, allergic rhinitis, and history of dermatitis presenting for same-day visit.  Apparently, yesterday she received immunotherapy injections and developed a large local reaction in the arm injected with the pollen/dander serum.  Approximately one hour later she began to experience sore throat and 2 inhalations from her albuterol rescue inhaler with perceived relief.  She notes that she typically takes an antihistamine on the day of injections, but forgot to do so yesterday.   Assessment and plan: Allergic reaction Immunotherapy injection reaction.  Prednisone has been provided 20 mg 3 days.  Immunotherapy dose for the pollens/dander vial will be decreased to 0.05 mL and buildup will be resumed at 0.05 mL increments as tolerated.  Hanin will take a second generation antihistamine prior to immunotherapy injections and have epinephrine autoinjectors on hand.  Allergic rhinitis  Continue appropriate allergen avoidance measures, aeroallergen immunotherapy (see above), montelukast 10 mg daily, and second generation antihistamine as needed.  Medications will be decreased or discontinued as symptom relief from immunotherapy becomes evident.  Moderate persistent asthma Stable.    Continue Breo Ellipta 100-25 g, one inhalation daily, montelukast 10 mg daily bedtime, and albuterol every 4-6 hours as needed.  Subjective and objective measures of pulmonary function will be followed and the treatment plan will be adjusted accordingly.   Meds ordered this encounter  Medications  . predniSONE (DELTASONE) tablet 10 mg    Diagnostics: Spirometry:  Normal with an FEV1 of 87% predicted.  Please see scanned spirometry  results for details.    Physical examination: Blood pressure 112/64, pulse 76, resp. rate 20, SpO2 97 %.  General: Alert, interactive, in no acute distress. HEENT: TMs pearly gray, turbinates moderately edematous with thick discharge, post-pharynx unremarkable. Neck: Supple without lymphadenopathy. Lungs: Clear to auscultation without wheezing, rhonchi or rales. CV: Normal S1, S2 without murmurs. Skin: Warm and dry, without lesions or rashes.  The following portions of the patient's history were reviewed and updated as appropriate: allergies, current medications, past family history, past medical history, past social history, past surgical history and problem list.  Allergies as of 03/02/2017      Reactions   Beef-derived Products Anaphylaxis   Lambs Quarters Anaphylaxis   Has not had but is warned by allergist that could have anaphylaxis.   Other Anaphylaxis   **SEAFOOD**   Pork-derived Products Anaphylaxis   Bee Venom Swelling   Latex       Medication List       Accurate as of 03/02/17  3:13 PM. Always use your most recent med list.          albuterol 108 (90 Base) MCG/ACT inhaler Commonly known as:  PROAIR HFA Inhale 2 puffs into the lungs every 4 (four) hours as needed for wheezing or shortness of breath.   aspirin 81 MG tablet Take 81 mg by mouth daily. TAKES 2 PILLS ONCE DAILY   BREO ELLIPTA 100-25 MCG/INH Aepb Generic drug:  fluticasone furoate-vilanterol Inhale 1 puff into the lungs daily.   clobetasol cream 0.05 % Commonly known as:  TEMOVATE APPLY TOPICALLY AA BID   EPINEPHrine 0.3 mg/0.3 mL Soaj injection Commonly known as:  EPIPEN 2-PAK USE AS DIRECTED FOR SEVERE ALLERGIC REACTION   losartan 50 MG tablet Commonly known as:  COZAAR  TK 1 T PO  BID   metFORMIN 1000 MG tablet Commonly known as:  GLUCOPHAGE Take 1 tablet by mouth 2 (two) times daily.   MIRENA IU by Intrauterine route. IUD   montelukast 10 MG tablet Commonly known as:   SINGULAIR Take 1 tablet (10 mg total) by mouth at bedtime.   multivitamin tablet Take by mouth daily.            Discharge Care Instructions        Start     Ordered   03/03/17 0800  PREDNISONE 1 MG PO TABS  Daily with breakfast     03/02/17 1512   03/02/17 0000  Spirometry with Graph    Question Answer Comment  Where should this test be performed? Other   Basic spirometry Yes      03/02/17 1512      Allergies  Allergen Reactions  . Beef-Derived Products Anaphylaxis  . Lambs Quarters Anaphylaxis    Has not had but is warned by allergist that could have anaphylaxis.  . Other Anaphylaxis    **SEAFOOD**  . Pork-Derived Products Anaphylaxis  . Bee Venom Swelling  . Latex     I appreciate the opportunity to take part in Lovely's care. Please do not hesitate to contact me with questions.  Sincerely,   R. Edgar Frisk, MD

## 2017-03-02 NOTE — Assessment & Plan Note (Signed)
Stable.    Continue Breo Ellipta 100-25 g, one inhalation daily, montelukast 10 mg daily bedtime, and albuterol every 4-6 hours as needed.  Subjective and objective measures of pulmonary function will be followed and the treatment plan will be adjusted accordingly.

## 2017-03-02 NOTE — Assessment & Plan Note (Signed)
Immunotherapy injection reaction.  Prednisone has been provided 20 mg 3 days.  Immunotherapy dose for the pollens/dander vial will be decreased to 0.05 mL and buildup will be resumed at 0.05 mL increments as tolerated.  Kierstynn will take a second generation antihistamine prior to immunotherapy injections and have epinephrine autoinjectors on hand.

## 2017-03-02 NOTE — Progress Notes (Signed)
Patient called [place] office stating that she had delayed reaction to injection. 4x4 when patient got home. Got up this morning and was 6x6. Painful, itching, sneezing, runny nose. Had to use rescue inhaler. Patient will be seen by Dr. Verlin Fester in [place] today.

## 2017-03-05 DIAGNOSIS — E669 Obesity, unspecified: Secondary | ICD-10-CM | POA: Diagnosis not present

## 2017-03-05 DIAGNOSIS — R799 Abnormal finding of blood chemistry, unspecified: Secondary | ICD-10-CM | POA: Diagnosis not present

## 2017-03-05 DIAGNOSIS — E559 Vitamin D deficiency, unspecified: Secondary | ICD-10-CM | POA: Diagnosis not present

## 2017-03-08 ENCOUNTER — Ambulatory Visit (INDEPENDENT_AMBULATORY_CARE_PROVIDER_SITE_OTHER): Payer: BLUE CROSS/BLUE SHIELD | Admitting: *Deleted

## 2017-03-08 DIAGNOSIS — J309 Allergic rhinitis, unspecified: Secondary | ICD-10-CM

## 2017-03-15 ENCOUNTER — Ambulatory Visit (INDEPENDENT_AMBULATORY_CARE_PROVIDER_SITE_OTHER): Payer: BLUE CROSS/BLUE SHIELD | Admitting: *Deleted

## 2017-03-15 DIAGNOSIS — J309 Allergic rhinitis, unspecified: Secondary | ICD-10-CM | POA: Diagnosis not present

## 2017-03-21 DIAGNOSIS — M7541 Impingement syndrome of right shoulder: Secondary | ICD-10-CM | POA: Diagnosis not present

## 2017-03-21 DIAGNOSIS — M1711 Unilateral primary osteoarthritis, right knee: Secondary | ICD-10-CM | POA: Diagnosis not present

## 2017-03-21 DIAGNOSIS — M5126 Other intervertebral disc displacement, lumbar region: Secondary | ICD-10-CM | POA: Diagnosis not present

## 2017-03-21 DIAGNOSIS — M7542 Impingement syndrome of left shoulder: Secondary | ICD-10-CM | POA: Diagnosis not present

## 2017-03-22 ENCOUNTER — Ambulatory Visit (INDEPENDENT_AMBULATORY_CARE_PROVIDER_SITE_OTHER): Payer: BLUE CROSS/BLUE SHIELD

## 2017-03-22 DIAGNOSIS — J309 Allergic rhinitis, unspecified: Secondary | ICD-10-CM | POA: Diagnosis not present

## 2017-03-29 ENCOUNTER — Ambulatory Visit (INDEPENDENT_AMBULATORY_CARE_PROVIDER_SITE_OTHER): Payer: BLUE CROSS/BLUE SHIELD | Admitting: *Deleted

## 2017-03-29 DIAGNOSIS — J309 Allergic rhinitis, unspecified: Secondary | ICD-10-CM

## 2017-04-05 ENCOUNTER — Ambulatory Visit (INDEPENDENT_AMBULATORY_CARE_PROVIDER_SITE_OTHER): Payer: BLUE CROSS/BLUE SHIELD | Admitting: *Deleted

## 2017-04-05 DIAGNOSIS — J309 Allergic rhinitis, unspecified: Secondary | ICD-10-CM | POA: Diagnosis not present

## 2017-04-06 ENCOUNTER — Telehealth: Payer: Self-pay

## 2017-04-06 NOTE — Telephone Encounter (Signed)
Noted Dr. Barnett Hatter recommendation in progress note in immunotherapy visit.

## 2017-04-06 NOTE — Progress Notes (Signed)
Let's take both vials back to 0.15 cc and hold there for the next 3 rounds of injections. If no problems, then may resume build up. Thanks. Per Dr. Verlin Fester

## 2017-04-06 NOTE — Progress Notes (Signed)
Immunotherapy   Patient Details  Name: Monica Good MRN: [id number] Date of Birth: 13-Mar-1966  04/06/2017  [place]   Pt. Called to let us know that she received her injection yesterday which was g-w-t-dm-cat-dog in her (L) arm. (L) arm is now hot,swollen,warm to the touch. The reaction is the size of her hand. Spoke to Dr. Verlin Fester and he said to give her 20 mg of prednisone for 3 days. Pt. Will pick up prednisone at our office. Pt. Aware.   Revonda Humphrey 04/06/2017, 9:20 AM

## 2017-04-06 NOTE — Telephone Encounter (Signed)
Spoke to pt. Asked her if she was having any other local reactions after her injections and she stated no pt. Stated she misunderstood me regarding local reactions.

## 2017-04-06 NOTE — Telephone Encounter (Signed)
Let's take both vials back to 0.15 cc and hold there for the next 3 rounds of injections. If no problems, then may resume build up. Thanks.

## 2017-04-06 NOTE — Telephone Encounter (Signed)
Pt. Had an injection yesterday stating it was hot,warm to the touch and swelling. The reaction was the size of her hand. Spoke to Dr. Verlin Fester and he said give her 20 mg of prednisone for the next 3 days. Pt. Aware. Now I looked back at her record and I spoke to [place] and the last reaction was on September 18th, but she reacted to the mold/cr and dosage was at 0.20. The reaction this time was to the g-w-t-dm-cat-dog at 0.25. Pt. Did say she has had other reactions but not as big. Left pt. A message for when she comes in to pick up her prednisone to stop in shot room bc I want to ask her how big her reactions have been.

## 2017-04-12 ENCOUNTER — Ambulatory Visit (INDEPENDENT_AMBULATORY_CARE_PROVIDER_SITE_OTHER): Payer: BLUE CROSS/BLUE SHIELD | Admitting: *Deleted

## 2017-04-12 DIAGNOSIS — J309 Allergic rhinitis, unspecified: Secondary | ICD-10-CM

## 2017-04-18 DIAGNOSIS — M7542 Impingement syndrome of left shoulder: Secondary | ICD-10-CM | POA: Diagnosis not present

## 2017-04-18 DIAGNOSIS — M7541 Impingement syndrome of right shoulder: Secondary | ICD-10-CM | POA: Diagnosis not present

## 2017-04-18 DIAGNOSIS — M5126 Other intervertebral disc displacement, lumbar region: Secondary | ICD-10-CM | POA: Diagnosis not present

## 2017-04-18 DIAGNOSIS — M1711 Unilateral primary osteoarthritis, right knee: Secondary | ICD-10-CM | POA: Diagnosis not present

## 2017-04-19 ENCOUNTER — Ambulatory Visit (INDEPENDENT_AMBULATORY_CARE_PROVIDER_SITE_OTHER): Payer: BLUE CROSS/BLUE SHIELD | Admitting: *Deleted

## 2017-04-19 DIAGNOSIS — J309 Allergic rhinitis, unspecified: Secondary | ICD-10-CM

## 2017-04-25 ENCOUNTER — Other Ambulatory Visit: Payer: Self-pay | Admitting: Allergy

## 2017-04-25 DIAGNOSIS — J4540 Moderate persistent asthma, uncomplicated: Secondary | ICD-10-CM

## 2017-04-25 MED ORDER — BREO ELLIPTA 100-25 MCG/INH IN AEPB: 1.0000 | INHALATION_SPRAY | Freq: Every day | RESPIRATORY_TRACT | 0 refills | Status: DC

## 2017-04-26 ENCOUNTER — Ambulatory Visit (INDEPENDENT_AMBULATORY_CARE_PROVIDER_SITE_OTHER): Payer: BLUE CROSS/BLUE SHIELD | Admitting: *Deleted

## 2017-04-26 DIAGNOSIS — J309 Allergic rhinitis, unspecified: Secondary | ICD-10-CM

## 2017-04-28 NOTE — Progress Notes (Signed)
VIALS EXP.04/29/18 

## 2017-04-29 DIAGNOSIS — J3089 Other allergic rhinitis: Secondary | ICD-10-CM | POA: Diagnosis not present

## 2017-05-04 ENCOUNTER — Ambulatory Visit (INDEPENDENT_AMBULATORY_CARE_PROVIDER_SITE_OTHER): Payer: BLUE CROSS/BLUE SHIELD

## 2017-05-04 DIAGNOSIS — J309 Allergic rhinitis, unspecified: Secondary | ICD-10-CM

## 2017-05-10 ENCOUNTER — Ambulatory Visit (INDEPENDENT_AMBULATORY_CARE_PROVIDER_SITE_OTHER): Payer: BLUE CROSS/BLUE SHIELD | Admitting: *Deleted

## 2017-05-10 DIAGNOSIS — J309 Allergic rhinitis, unspecified: Secondary | ICD-10-CM

## 2017-05-16 DIAGNOSIS — M7541 Impingement syndrome of right shoulder: Secondary | ICD-10-CM | POA: Diagnosis not present

## 2017-05-16 DIAGNOSIS — M1711 Unilateral primary osteoarthritis, right knee: Secondary | ICD-10-CM | POA: Diagnosis not present

## 2017-05-16 DIAGNOSIS — M5126 Other intervertebral disc displacement, lumbar region: Secondary | ICD-10-CM | POA: Diagnosis not present

## 2017-05-16 DIAGNOSIS — G5601 Carpal tunnel syndrome, right upper limb: Secondary | ICD-10-CM | POA: Diagnosis not present

## 2017-05-19 ENCOUNTER — Ambulatory Visit (INDEPENDENT_AMBULATORY_CARE_PROVIDER_SITE_OTHER): Payer: BLUE CROSS/BLUE SHIELD | Admitting: Allergy

## 2017-05-19 DIAGNOSIS — J309 Allergic rhinitis, unspecified: Secondary | ICD-10-CM

## 2017-05-20 DIAGNOSIS — R5383 Other fatigue: Secondary | ICD-10-CM | POA: Diagnosis not present

## 2017-05-20 DIAGNOSIS — E559 Vitamin D deficiency, unspecified: Secondary | ICD-10-CM | POA: Diagnosis not present

## 2017-05-20 DIAGNOSIS — R739 Hyperglycemia, unspecified: Secondary | ICD-10-CM | POA: Diagnosis not present

## 2017-05-26 ENCOUNTER — Ambulatory Visit (INDEPENDENT_AMBULATORY_CARE_PROVIDER_SITE_OTHER): Payer: BLUE CROSS/BLUE SHIELD | Admitting: *Deleted

## 2017-05-26 DIAGNOSIS — J309 Allergic rhinitis, unspecified: Secondary | ICD-10-CM

## 2017-06-03 ENCOUNTER — Ambulatory Visit (INDEPENDENT_AMBULATORY_CARE_PROVIDER_SITE_OTHER): Payer: BLUE CROSS/BLUE SHIELD

## 2017-06-03 DIAGNOSIS — J309 Allergic rhinitis, unspecified: Secondary | ICD-10-CM | POA: Diagnosis not present

## 2017-06-09 ENCOUNTER — Ambulatory Visit (INDEPENDENT_AMBULATORY_CARE_PROVIDER_SITE_OTHER): Payer: BLUE CROSS/BLUE SHIELD | Admitting: *Deleted

## 2017-06-09 DIAGNOSIS — J309 Allergic rhinitis, unspecified: Secondary | ICD-10-CM

## 2017-06-13 ENCOUNTER — Ambulatory Visit (INDEPENDENT_AMBULATORY_CARE_PROVIDER_SITE_OTHER): Payer: BLUE CROSS/BLUE SHIELD

## 2017-06-13 DIAGNOSIS — J309 Allergic rhinitis, unspecified: Secondary | ICD-10-CM

## 2017-06-15 DIAGNOSIS — M5126 Other intervertebral disc displacement, lumbar region: Secondary | ICD-10-CM | POA: Diagnosis not present

## 2017-06-15 DIAGNOSIS — M7541 Impingement syndrome of right shoulder: Secondary | ICD-10-CM | POA: Diagnosis not present

## 2017-06-15 DIAGNOSIS — G5601 Carpal tunnel syndrome, right upper limb: Secondary | ICD-10-CM | POA: Diagnosis not present

## 2017-06-15 DIAGNOSIS — M1711 Unilateral primary osteoarthritis, right knee: Secondary | ICD-10-CM | POA: Diagnosis not present

## 2017-06-20 DIAGNOSIS — I10 Essential (primary) hypertension: Secondary | ICD-10-CM | POA: Diagnosis not present

## 2017-06-20 DIAGNOSIS — E7800 Pure hypercholesterolemia, unspecified: Secondary | ICD-10-CM | POA: Diagnosis not present

## 2017-06-20 DIAGNOSIS — J4520 Mild intermittent asthma, uncomplicated: Secondary | ICD-10-CM | POA: Diagnosis not present

## 2017-06-20 DIAGNOSIS — Z23 Encounter for immunization: Secondary | ICD-10-CM | POA: Diagnosis not present

## 2017-06-20 DIAGNOSIS — Z0000 Encounter for general adult medical examination without abnormal findings: Secondary | ICD-10-CM | POA: Diagnosis not present

## 2017-06-21 ENCOUNTER — Ambulatory Visit (INDEPENDENT_AMBULATORY_CARE_PROVIDER_SITE_OTHER): Payer: BLUE CROSS/BLUE SHIELD | Admitting: *Deleted

## 2017-06-21 DIAGNOSIS — J309 Allergic rhinitis, unspecified: Secondary | ICD-10-CM | POA: Diagnosis not present

## 2017-06-28 ENCOUNTER — Ambulatory Visit (INDEPENDENT_AMBULATORY_CARE_PROVIDER_SITE_OTHER): Payer: BLUE CROSS/BLUE SHIELD | Admitting: *Deleted

## 2017-06-28 DIAGNOSIS — J309 Allergic rhinitis, unspecified: Secondary | ICD-10-CM | POA: Diagnosis not present

## 2017-07-05 ENCOUNTER — Ambulatory Visit (INDEPENDENT_AMBULATORY_CARE_PROVIDER_SITE_OTHER): Payer: BLUE CROSS/BLUE SHIELD | Admitting: *Deleted

## 2017-07-05 DIAGNOSIS — J309 Allergic rhinitis, unspecified: Secondary | ICD-10-CM | POA: Diagnosis not present

## 2017-07-12 ENCOUNTER — Ambulatory Visit (INDEPENDENT_AMBULATORY_CARE_PROVIDER_SITE_OTHER): Payer: BLUE CROSS/BLUE SHIELD | Admitting: *Deleted

## 2017-07-12 DIAGNOSIS — J309 Allergic rhinitis, unspecified: Secondary | ICD-10-CM | POA: Diagnosis not present

## 2017-07-13 DIAGNOSIS — M5126 Other intervertebral disc displacement, lumbar region: Secondary | ICD-10-CM | POA: Diagnosis not present

## 2017-07-13 DIAGNOSIS — G5601 Carpal tunnel syndrome, right upper limb: Secondary | ICD-10-CM | POA: Diagnosis not present

## 2017-07-13 DIAGNOSIS — M7541 Impingement syndrome of right shoulder: Secondary | ICD-10-CM | POA: Diagnosis not present

## 2017-07-13 DIAGNOSIS — M1711 Unilateral primary osteoarthritis, right knee: Secondary | ICD-10-CM | POA: Diagnosis not present

## 2017-07-18 ENCOUNTER — Other Ambulatory Visit: Payer: Self-pay

## 2017-07-18 DIAGNOSIS — J4540 Moderate persistent asthma, uncomplicated: Secondary | ICD-10-CM

## 2017-07-18 MED ORDER — BREO ELLIPTA 100-25 MCG/INH IN AEPB: 1.0000 | INHALATION_SPRAY | Freq: Every day | RESPIRATORY_TRACT | 0 refills | Status: DC

## 2017-07-19 ENCOUNTER — Ambulatory Visit (INDEPENDENT_AMBULATORY_CARE_PROVIDER_SITE_OTHER): Payer: BLUE CROSS/BLUE SHIELD | Admitting: *Deleted

## 2017-07-19 DIAGNOSIS — J309 Allergic rhinitis, unspecified: Secondary | ICD-10-CM | POA: Diagnosis not present

## 2017-07-20 ENCOUNTER — Other Ambulatory Visit: Payer: Self-pay

## 2017-07-20 DIAGNOSIS — J4540 Moderate persistent asthma, uncomplicated: Secondary | ICD-10-CM

## 2017-07-20 MED ORDER — BREO ELLIPTA 100-25 MCG/INH IN AEPB: 1.0000 | INHALATION_SPRAY | Freq: Every day | RESPIRATORY_TRACT | 0 refills | Status: DC

## 2017-07-21 ENCOUNTER — Other Ambulatory Visit: Payer: Self-pay | Admitting: Allergy

## 2017-07-21 DIAGNOSIS — J4540 Moderate persistent asthma, uncomplicated: Secondary | ICD-10-CM

## 2017-07-21 MED ORDER — BREO ELLIPTA 100-25 MCG/INH IN AEPB: 1.0000 | INHALATION_SPRAY | Freq: Every day | RESPIRATORY_TRACT | 0 refills | Status: DC

## 2017-07-26 ENCOUNTER — Ambulatory Visit (INDEPENDENT_AMBULATORY_CARE_PROVIDER_SITE_OTHER): Payer: BLUE CROSS/BLUE SHIELD | Admitting: *Deleted

## 2017-07-26 DIAGNOSIS — J309 Allergic rhinitis, unspecified: Secondary | ICD-10-CM | POA: Diagnosis not present

## 2017-08-02 ENCOUNTER — Ambulatory Visit (INDEPENDENT_AMBULATORY_CARE_PROVIDER_SITE_OTHER): Payer: BLUE CROSS/BLUE SHIELD | Admitting: *Deleted

## 2017-08-02 DIAGNOSIS — J309 Allergic rhinitis, unspecified: Secondary | ICD-10-CM

## 2017-08-04 ENCOUNTER — Encounter: Payer: Self-pay | Admitting: Allergy and Immunology

## 2017-08-04 ENCOUNTER — Ambulatory Visit: Payer: BLUE CROSS/BLUE SHIELD | Admitting: Allergy and Immunology

## 2017-08-04 VITALS — BP 116/74 | HR 72 | Temp 98.1°F | Resp 12

## 2017-08-04 DIAGNOSIS — T7800XD Anaphylactic reaction due to unspecified food, subsequent encounter: Secondary | ICD-10-CM | POA: Diagnosis not present

## 2017-08-04 DIAGNOSIS — J4540 Moderate persistent asthma, uncomplicated: Secondary | ICD-10-CM | POA: Diagnosis not present

## 2017-08-04 DIAGNOSIS — J3089 Other allergic rhinitis: Secondary | ICD-10-CM

## 2017-08-04 MED ORDER — EPINEPHRINE 0.3 MG/0.3ML IJ SOAJ: INTRAMUSCULAR | 3 refills | Status: DC

## 2017-08-04 MED ORDER — ALBUTEROL SULFATE HFA 108 (90 BASE) MCG/ACT IN AERS
2.0000 | INHALATION_SPRAY | RESPIRATORY_TRACT | 1 refills | Status: DC | PRN
Start: 2017-08-04 — End: 2017-11-30

## 2017-08-04 NOTE — Assessment & Plan Note (Signed)
   Continue meticulous avoidance of seafood and mammalian meat and have access to epinephrine autoinjector 2 pack in case of accidental ingestion.  Food allergy action plan is in place.

## 2017-08-04 NOTE — Patient Instructions (Addendum)
Moderate persistent asthma Currently well controlled.  For now, continue Breo Ellipta 100-25 g, one inhalation daily, and albuterol every 4-6 hours as needed.  If subjective and objective measures of pulmonary function remain stable, we will consider stepping down therapy on the next visit.  Allergic rhinitis Stable.  Continue appropriate allergen avoidance measures, aeroallergen immunotherapy injections, and second generation antihistamine as needed.  Allergy with anaphylaxis due to food  Continue meticulous avoidance of seafood and mammalian meat and have access to epinephrine autoinjector 2 pack in case of accidental ingestion.  Food allergy action plan is in place.   Return in about 6 months (around 02/01/2018), or if symptoms worsen or fail to improve.

## 2017-08-04 NOTE — Progress Notes (Signed)
Follow-up Note  RE: Monica Good MRN: [id number] DOB: [birth year] Date of Office Visit: 08/04/2017  Primary care provider: Carol Ada, MD Referring provider: Carol Ada, MD  History of present illness: Monica Good is a 52 y.o. female with persistent asthma, allergic rhinitis, and history of dermatitis presenting for follow-up.  She was last seen in this clinic in September 2018.  She reports that in the interval since her previous visit her upper and lower respiratory symptoms have been well controlled.  She is currently taking Brio Ellipta 100-25 g, 1 inhalation daily.  While on this regimen, she rarely requires albuterol rescue, denies limitations in daily activities, and denies nocturnal awakenings due to lower respiratory symptoms.  Her nasal allergy symptoms are currently well controlled.  In the interval since her previous visit she has been receiving aeroallergen immunotherapy injections without problems or complications.  She carefully avoids mammalian meat as well as shellfish and has access to epinephrine autoinjectors.   Assessment and plan: Moderate persistent asthma Currently well controlled.  For now, continue Breo Ellipta 100-25 g, one inhalation daily, and albuterol every 4-6 hours as needed.  If subjective and objective measures of pulmonary function remain stable, we will consider stepping down therapy on the next visit.  Allergic rhinitis Stable.  Continue appropriate allergen avoidance measures, aeroallergen immunotherapy injections, and second generation antihistamine as needed.  Allergy with anaphylaxis due to food  Continue meticulous avoidance of seafood and mammalian meat and have access to epinephrine autoinjector 2 pack in case of accidental ingestion.  Food allergy action plan is in place.   Meds ordered this encounter  Medications  . EPINEPHrine (EPIPEN 2-PAK) 0.3 mg/0.3 mL IJ SOAJ injection    Sig: USE AS DIRECTED FOR SEVERE  ALLERGIC REACTION    Dispense:  4 Device    Refill:  3  . albuterol (PROAIR HFA) 108 (90 Base) MCG/ACT inhaler    Sig: Inhale 2 puffs into the lungs every 4 (four) hours as needed for wheezing or shortness of breath.    Dispense:  1 Inhaler    Refill:  1    Diagnostics: Spirometry:  Normal with an FEV1 of 92% predicted.  Please see scanned spirometry results for details.    Physical examination: Blood pressure 116/74, pulse 72, temperature 98.1 F (36.7 C), temperature source Oral, resp. rate 12, SpO2 96 %.  General: Alert, interactive, in no acute distress. HEENT: TMs pearly gray, turbinates minimally edematous without discharge, post-pharynx unremarkable. Neck: Supple without lymphadenopathy. Lungs: Clear to auscultation without wheezing, rhonchi or rales. CV: Normal S1, S2 without murmurs. Skin: Warm and dry, without lesions or rashes.  The following portions of the patient's history were reviewed and updated as appropriate: allergies, current medications, past family history, past medical history, past social history, past surgical history and problem list.  Allergies as of 08/04/2017      Reactions   Beef-derived Products Anaphylaxis   Lambs Quarters Anaphylaxis   Has not had but is warned by allergist that could have anaphylaxis.   Other Anaphylaxis   **SEAFOOD**   Pork-derived Products Anaphylaxis   Bee Venom Swelling   Latex    Pollen Extract       Medication List        Accurate as of 08/04/17  3:41 PM. Always use your most recent med list.          albuterol 108 (90 Base) MCG/ACT inhaler Commonly known as:  PROAIR HFA Inhale 2 puffs into the  lungs every 4 (four) hours as needed for wheezing or shortness of breath.   aspirin 81 MG tablet Take 81 mg by mouth daily. TAKES 2 PILLS ONCE DAILY   BREO ELLIPTA 100-25 MCG/INH Aepb Generic drug:  fluticasone furoate-vilanterol Inhale 1 puff into the lungs daily.   clobetasol cream 0.05 % Commonly known as:   TEMOVATE APPLY TOPICALLY AA BID   EPINEPHrine 0.3 mg/0.3 mL Soaj injection Commonly known as:  EPIPEN 2-PAK USE AS DIRECTED FOR SEVERE ALLERGIC REACTION   losartan 50 MG tablet Commonly known as:  COZAAR TK 1 T PO  BID   metFORMIN 1000 MG tablet Commonly known as:  GLUCOPHAGE Take 1 tablet by mouth 2 (two) times daily.   MIRENA IU by Intrauterine route. IUD   montelukast 10 MG tablet Commonly known as:  SINGULAIR Take 1 tablet (10 mg total) by mouth at bedtime.   multivitamin tablet Take by mouth daily.   POTASSIUM PO Take 10 mEq by mouth daily.       Allergies  Allergen Reactions  . Beef-Derived Products Anaphylaxis  . Lambs Quarters Anaphylaxis    Has not had but is warned by allergist that could have anaphylaxis.  . Other Anaphylaxis    **SEAFOOD**  . Pork-Derived Products Anaphylaxis  . Bee Venom Swelling  . Latex   . Pollen Extract     I appreciate the opportunity to take part in Dailah's care. Please do not hesitate to contact me with questions.  Sincerely,   R. Edgar Frisk, MD

## 2017-08-04 NOTE — Assessment & Plan Note (Addendum)
Stable.  Continue appropriate allergen avoidance measures, aeroallergen immunotherapy injections, and second generation antihistamine as needed.

## 2017-08-04 NOTE — Assessment & Plan Note (Addendum)
Currently well controlled.  For now, continue Breo Ellipta 100-25 g, one inhalation daily, and albuterol every 4-6 hours as needed.  If subjective and objective measures of pulmonary function remain stable, we will consider stepping down therapy on the next visit.

## 2017-08-05 DIAGNOSIS — Z1231 Encounter for screening mammogram for malignant neoplasm of breast: Secondary | ICD-10-CM | POA: Diagnosis not present

## 2017-08-09 ENCOUNTER — Ambulatory Visit (INDEPENDENT_AMBULATORY_CARE_PROVIDER_SITE_OTHER): Payer: BLUE CROSS/BLUE SHIELD | Admitting: *Deleted

## 2017-08-09 DIAGNOSIS — J309 Allergic rhinitis, unspecified: Secondary | ICD-10-CM

## 2017-08-12 DIAGNOSIS — G5601 Carpal tunnel syndrome, right upper limb: Secondary | ICD-10-CM | POA: Diagnosis not present

## 2017-08-12 DIAGNOSIS — M5126 Other intervertebral disc displacement, lumbar region: Secondary | ICD-10-CM | POA: Diagnosis not present

## 2017-08-12 DIAGNOSIS — M1711 Unilateral primary osteoarthritis, right knee: Secondary | ICD-10-CM | POA: Diagnosis not present

## 2017-08-12 DIAGNOSIS — M7541 Impingement syndrome of right shoulder: Secondary | ICD-10-CM | POA: Diagnosis not present

## 2017-08-16 ENCOUNTER — Ambulatory Visit (INDEPENDENT_AMBULATORY_CARE_PROVIDER_SITE_OTHER): Payer: BLUE CROSS/BLUE SHIELD | Admitting: *Deleted

## 2017-08-16 DIAGNOSIS — J309 Allergic rhinitis, unspecified: Secondary | ICD-10-CM

## 2017-08-23 ENCOUNTER — Ambulatory Visit (INDEPENDENT_AMBULATORY_CARE_PROVIDER_SITE_OTHER): Payer: BLUE CROSS/BLUE SHIELD | Admitting: *Deleted

## 2017-08-23 DIAGNOSIS — J309 Allergic rhinitis, unspecified: Secondary | ICD-10-CM

## 2017-08-25 NOTE — Progress Notes (Signed)
VIALS EXP 08-26-18

## 2017-08-26 DIAGNOSIS — J3089 Other allergic rhinitis: Secondary | ICD-10-CM | POA: Diagnosis not present

## 2017-08-30 ENCOUNTER — Ambulatory Visit (INDEPENDENT_AMBULATORY_CARE_PROVIDER_SITE_OTHER): Payer: BLUE CROSS/BLUE SHIELD | Admitting: *Deleted

## 2017-08-30 DIAGNOSIS — J309 Allergic rhinitis, unspecified: Secondary | ICD-10-CM

## 2017-09-06 ENCOUNTER — Ambulatory Visit (INDEPENDENT_AMBULATORY_CARE_PROVIDER_SITE_OTHER): Payer: BLUE CROSS/BLUE SHIELD | Admitting: *Deleted

## 2017-09-06 DIAGNOSIS — J309 Allergic rhinitis, unspecified: Secondary | ICD-10-CM | POA: Diagnosis not present

## 2017-09-09 DIAGNOSIS — G5601 Carpal tunnel syndrome, right upper limb: Secondary | ICD-10-CM | POA: Diagnosis not present

## 2017-09-09 DIAGNOSIS — M7541 Impingement syndrome of right shoulder: Secondary | ICD-10-CM | POA: Diagnosis not present

## 2017-09-09 DIAGNOSIS — M5126 Other intervertebral disc displacement, lumbar region: Secondary | ICD-10-CM | POA: Diagnosis not present

## 2017-09-09 DIAGNOSIS — M1711 Unilateral primary osteoarthritis, right knee: Secondary | ICD-10-CM | POA: Diagnosis not present

## 2017-09-13 ENCOUNTER — Ambulatory Visit (INDEPENDENT_AMBULATORY_CARE_PROVIDER_SITE_OTHER): Payer: BLUE CROSS/BLUE SHIELD | Admitting: *Deleted

## 2017-09-13 DIAGNOSIS — J309 Allergic rhinitis, unspecified: Secondary | ICD-10-CM | POA: Diagnosis not present

## 2017-09-20 ENCOUNTER — Ambulatory Visit (INDEPENDENT_AMBULATORY_CARE_PROVIDER_SITE_OTHER): Payer: BLUE CROSS/BLUE SHIELD | Admitting: *Deleted

## 2017-09-20 DIAGNOSIS — J309 Allergic rhinitis, unspecified: Secondary | ICD-10-CM

## 2017-09-27 DIAGNOSIS — Z23 Encounter for immunization: Secondary | ICD-10-CM | POA: Diagnosis not present

## 2017-09-29 ENCOUNTER — Ambulatory Visit (INDEPENDENT_AMBULATORY_CARE_PROVIDER_SITE_OTHER): Payer: BLUE CROSS/BLUE SHIELD | Admitting: *Deleted

## 2017-09-29 DIAGNOSIS — J309 Allergic rhinitis, unspecified: Secondary | ICD-10-CM

## 2017-10-04 ENCOUNTER — Ambulatory Visit (INDEPENDENT_AMBULATORY_CARE_PROVIDER_SITE_OTHER): Payer: BLUE CROSS/BLUE SHIELD | Admitting: *Deleted

## 2017-10-04 DIAGNOSIS — J309 Allergic rhinitis, unspecified: Secondary | ICD-10-CM

## 2017-10-07 DIAGNOSIS — M5126 Other intervertebral disc displacement, lumbar region: Secondary | ICD-10-CM | POA: Diagnosis not present

## 2017-10-07 DIAGNOSIS — G5601 Carpal tunnel syndrome, right upper limb: Secondary | ICD-10-CM | POA: Diagnosis not present

## 2017-10-07 DIAGNOSIS — M7541 Impingement syndrome of right shoulder: Secondary | ICD-10-CM | POA: Diagnosis not present

## 2017-10-07 DIAGNOSIS — M1711 Unilateral primary osteoarthritis, right knee: Secondary | ICD-10-CM | POA: Diagnosis not present

## 2017-10-11 ENCOUNTER — Ambulatory Visit (INDEPENDENT_AMBULATORY_CARE_PROVIDER_SITE_OTHER): Payer: BLUE CROSS/BLUE SHIELD | Admitting: *Deleted

## 2017-10-11 DIAGNOSIS — J309 Allergic rhinitis, unspecified: Secondary | ICD-10-CM

## 2017-10-18 ENCOUNTER — Ambulatory Visit (INDEPENDENT_AMBULATORY_CARE_PROVIDER_SITE_OTHER): Payer: BLUE CROSS/BLUE SHIELD | Admitting: *Deleted

## 2017-10-18 DIAGNOSIS — J309 Allergic rhinitis, unspecified: Secondary | ICD-10-CM | POA: Diagnosis not present

## 2017-10-21 ENCOUNTER — Telehealth: Payer: Self-pay

## 2017-10-21 DIAGNOSIS — E785 Hyperlipidemia, unspecified: Secondary | ICD-10-CM | POA: Diagnosis not present

## 2017-10-21 DIAGNOSIS — R635 Abnormal weight gain: Secondary | ICD-10-CM | POA: Diagnosis not present

## 2017-10-21 DIAGNOSIS — R634 Abnormal weight loss: Secondary | ICD-10-CM | POA: Diagnosis not present

## 2017-10-21 DIAGNOSIS — E1165 Type 2 diabetes mellitus with hyperglycemia: Secondary | ICD-10-CM | POA: Diagnosis not present

## 2017-10-21 DIAGNOSIS — J4540 Moderate persistent asthma, uncomplicated: Secondary | ICD-10-CM

## 2017-10-21 DIAGNOSIS — L2084 Intrinsic (allergic) eczema: Secondary | ICD-10-CM | POA: Diagnosis not present

## 2017-10-21 DIAGNOSIS — E7800 Pure hypercholesterolemia, unspecified: Secondary | ICD-10-CM

## 2017-10-21 DIAGNOSIS — Z8672 Personal history of thrombophlebitis: Secondary | ICD-10-CM | POA: Diagnosis not present

## 2017-10-21 DIAGNOSIS — R748 Abnormal levels of other serum enzymes: Secondary | ICD-10-CM

## 2017-10-21 DIAGNOSIS — R7401 Elevation of levels of liver transaminase levels: Secondary | ICD-10-CM

## 2017-10-21 DIAGNOSIS — I10 Essential (primary) hypertension: Secondary | ICD-10-CM | POA: Diagnosis not present

## 2017-10-21 HISTORY — DX: Pure hypercholesterolemia, unspecified: E78.00

## 2017-10-21 HISTORY — DX: Abnormal levels of other serum enzymes: R74.8

## 2017-10-21 HISTORY — DX: Elevation of levels of liver transaminase levels: R74.01

## 2017-10-21 MED ORDER — BREO ELLIPTA 100-25 MCG/INH IN AEPB: 1.0000 | INHALATION_SPRAY | Freq: Every day | RESPIRATORY_TRACT | 0 refills | Status: DC

## 2017-10-21 NOTE — Telephone Encounter (Signed)
RF on Breo x 3 with no refills at [facility]

## 2017-10-25 ENCOUNTER — Ambulatory Visit (INDEPENDENT_AMBULATORY_CARE_PROVIDER_SITE_OTHER): Payer: BLUE CROSS/BLUE SHIELD | Admitting: *Deleted

## 2017-10-25 DIAGNOSIS — J309 Allergic rhinitis, unspecified: Secondary | ICD-10-CM | POA: Diagnosis not present

## 2017-11-01 ENCOUNTER — Ambulatory Visit (INDEPENDENT_AMBULATORY_CARE_PROVIDER_SITE_OTHER): Payer: BLUE CROSS/BLUE SHIELD | Admitting: *Deleted

## 2017-11-01 DIAGNOSIS — J309 Allergic rhinitis, unspecified: Secondary | ICD-10-CM

## 2017-11-02 NOTE — Progress Notes (Signed)
VIALS EXP 11-03-18

## 2017-11-04 DIAGNOSIS — M5126 Other intervertebral disc displacement, lumbar region: Secondary | ICD-10-CM | POA: Diagnosis not present

## 2017-11-04 DIAGNOSIS — G5601 Carpal tunnel syndrome, right upper limb: Secondary | ICD-10-CM | POA: Diagnosis not present

## 2017-11-04 DIAGNOSIS — M7541 Impingement syndrome of right shoulder: Secondary | ICD-10-CM | POA: Diagnosis not present

## 2017-11-04 DIAGNOSIS — M1711 Unilateral primary osteoarthritis, right knee: Secondary | ICD-10-CM | POA: Diagnosis not present

## 2017-11-08 ENCOUNTER — Ambulatory Visit (INDEPENDENT_AMBULATORY_CARE_PROVIDER_SITE_OTHER): Payer: BLUE CROSS/BLUE SHIELD | Admitting: *Deleted

## 2017-11-08 DIAGNOSIS — J309 Allergic rhinitis, unspecified: Secondary | ICD-10-CM | POA: Diagnosis not present

## 2017-11-09 DIAGNOSIS — J3089 Other allergic rhinitis: Secondary | ICD-10-CM | POA: Diagnosis not present

## 2017-11-15 ENCOUNTER — Ambulatory Visit (INDEPENDENT_AMBULATORY_CARE_PROVIDER_SITE_OTHER): Payer: BLUE CROSS/BLUE SHIELD | Admitting: *Deleted

## 2017-11-15 DIAGNOSIS — J309 Allergic rhinitis, unspecified: Secondary | ICD-10-CM | POA: Diagnosis not present

## 2017-11-22 ENCOUNTER — Ambulatory Visit (INDEPENDENT_AMBULATORY_CARE_PROVIDER_SITE_OTHER): Payer: BLUE CROSS/BLUE SHIELD | Admitting: *Deleted

## 2017-11-22 DIAGNOSIS — J309 Allergic rhinitis, unspecified: Secondary | ICD-10-CM | POA: Diagnosis not present

## 2017-11-24 ENCOUNTER — Encounter: Payer: Self-pay | Admitting: Allergy and Immunology

## 2017-11-24 ENCOUNTER — Ambulatory Visit: Payer: BLUE CROSS/BLUE SHIELD | Admitting: Allergy and Immunology

## 2017-11-24 VITALS — BP 130/90 | HR 70 | Temp 97.8°F | Resp 16 | Ht 67.0 in | Wt 265.9 lb

## 2017-11-24 DIAGNOSIS — T7800XD Anaphylactic reaction due to unspecified food, subsequent encounter: Secondary | ICD-10-CM | POA: Diagnosis not present

## 2017-11-24 DIAGNOSIS — K219 Gastro-esophageal reflux disease without esophagitis: Secondary | ICD-10-CM

## 2017-11-24 DIAGNOSIS — F458 Other somatoform disorders: Secondary | ICD-10-CM

## 2017-11-24 DIAGNOSIS — R05 Cough: Secondary | ICD-10-CM

## 2017-11-24 DIAGNOSIS — J3089 Other allergic rhinitis: Secondary | ICD-10-CM

## 2017-11-24 DIAGNOSIS — J4530 Mild persistent asthma, uncomplicated: Secondary | ICD-10-CM | POA: Diagnosis not present

## 2017-11-24 DIAGNOSIS — R198 Other specified symptoms and signs involving the digestive system and abdomen: Secondary | ICD-10-CM

## 2017-11-24 DIAGNOSIS — R0989 Other specified symptoms and signs involving the circulatory and respiratory systems: Secondary | ICD-10-CM | POA: Insufficient documentation

## 2017-11-24 DIAGNOSIS — R059 Cough, unspecified: Secondary | ICD-10-CM

## 2017-11-24 MED ORDER — EPINEPHRINE 0.3 MG/0.3ML IJ SOAJ: INTRAMUSCULAR | 2 refills | Status: DC

## 2017-11-24 MED ORDER — CARBINOXAMINE MALEATE 4 MG PO TABS: ORAL_TABLET | ORAL | 5 refills | Status: DC

## 2017-11-24 MED ORDER — OMEPRAZOLE 20 MG PO CPDR: DELAYED_RELEASE_CAPSULE | ORAL | 5 refills | Status: DC

## 2017-11-24 MED ORDER — FLUTICASONE FUROATE 100 MCG/ACT IN AEPB: 1.0000 | INHALATION_SPRAY | Freq: Every day | RESPIRATORY_TRACT | 5 refills | Status: DC

## 2017-11-24 MED ORDER — AZELASTINE HCL 0.15 % NA SOLN: NASAL | 5 refills | Status: DC

## 2017-11-24 MED ORDER — ALBUTEROL SULFATE HFA 108 (90 BASE) MCG/ACT IN AERS: 2.0000 | INHALATION_SPRAY | RESPIRATORY_TRACT | 1 refills | Status: DC | PRN

## 2017-11-24 NOTE — Assessment & Plan Note (Signed)
Most likely secondary to acid reflux and/or postnasal drainage.  Treatment plan as outlined below.

## 2017-11-24 NOTE — Assessment & Plan Note (Signed)
Well-controlled, we will stepdown therapy at this time.  A prescription has been provided for Arnuity Ellipta 100 g, 1 inhalation daily.  During upper respiratory tract infections and asthma flares, resume Brio Ellipta 100-25 g, 1 inhalation daily, until symptoms have returned to baseline.  Continue albuterol HFA, 1 to 2 inhalations every 6 hours if needed.  Subjective and objective measures of pulmonary function will be followed and the treatment plan will be adjusted accordingly.

## 2017-11-24 NOTE — Assessment & Plan Note (Signed)
   Continue appropriate allergen avoidance measures and immunotherapy injections as prescribed.  A prescription has been provided for carbinoxamine 4 mg every 6-8 hours if needed.  A prescription has been provided for azelastine nasal spray, 1-2 sprays per nostril 2 times daily as needed. Proper nasal spray technique has been discussed and demonstrated.   Nasal saline lavage (NeilMed) has been recommended as needed and prior to medicated nasal sprays along with instructions for proper administration.

## 2017-11-24 NOTE — Patient Instructions (Addendum)
Food Allergy  A lab order form has been provided for serum specific IgE against alpha gal panel.  For now, continue avoidance of mammalian meat, shellfish, and fish, and have access to epinephrine autoinjectors.  Food allergy action plan is in place.  Globus sensation Most likely secondary to acid reflux and/or postnasal drainage.  Treatment plan as outlined below.  Allergic rhinitis  Continue appropriate allergen avoidance measures and immunotherapy injections as prescribed.  A prescription has been provided for carbinoxamine 4 mg every 6-8 hours if needed.  A prescription has been provided for azelastine nasal spray, 1-2 sprays per nostril 2 times daily as needed. Proper nasal spray technique has been discussed and demonstrated.   Nasal saline lavage (NeilMed) has been recommended as needed and prior to medicated nasal sprays along with instructions for proper administration.  Laryngopharyngeal reflux (LPR)  Appropriate reflux lifestyle modifications have been discussed and provided in written form.  A prescription has been provided for omeprazole 40 mg daily, 30 minutes prior to breakfast.  If symptoms improve, may decrease dose to 20 mg daily, 30 minutes prior to breakfast.  Mild persistent asthma Well-controlled, we will stepdown therapy at this time.  A prescription has been provided for Arnuity Ellipta 100 g, 1 inhalation daily.  During upper respiratory tract infections and asthma flares, resume Brio Ellipta 100-25 g, 1 inhalation daily, until symptoms have returned to baseline.  Continue albuterol HFA, 1 to 2 inhalations every 6 hours if needed.  Subjective and objective measures of pulmonary function will be followed and the treatment plan will be adjusted accordingly.   Return in about 4 months (around 03/26/2018), or if symptoms worsen or fail to improve.

## 2017-11-24 NOTE — Progress Notes (Signed)
Follow-up Note  RE: Monica Good MRN: [id number] DOB: 01-29-1966 Date of Office Visit: 11/24/2017  Primary care provider: Carol Ada, MD Referring provider: Carol Ada, MD  History of present illness: Monica Good is a 52 y.o. female with persistent asthma, allergic rhinitis, and history of dermatitis presenting for follow-up.  She was last seen in this clinic on August 04, 2017.  She reports that her asthma has been well controlled in the interval since her previous visit.  She rarely requires albuterol rescue, denies limitations in normal daily activities, and denies nocturnal awakenings due to lower respiratory symptoms.  Her nasal allergy symptoms have improved significantly while on immunotherapy injections.  However, she does complain of the sensation of thick mucus in the base of her throat.  She does not notice overt postnasal drainage.  She states that she does get heartburn, and less she is careful with her diet.  The patient avoids shellfish, fish, beef, pork, and lamb.  She is interested in retesting to alpha gal panel.  Assessment and plan: Food Allergy  A lab order form has been provided for serum specific IgE against alpha gal panel.  For now, continue avoidance of mammalian meat, shellfish, and fish, and have access to epinephrine autoinjectors.  Food allergy action plan is in place.  Globus sensation Most likely secondary to acid reflux and/or postnasal drainage.  Treatment plan as outlined below.  Allergic rhinitis  Continue appropriate allergen avoidance measures and immunotherapy injections as prescribed.  A prescription has been provided for carbinoxamine 4 mg every 6-8 hours if needed.  A prescription has been provided for azelastine nasal spray, 1-2 sprays per nostril 2 times daily as needed. Proper nasal spray technique has been discussed and demonstrated.   Nasal saline lavage (NeilMed) has been recommended as needed and prior to  medicated nasal sprays along with instructions for proper administration.  Laryngopharyngeal reflux (LPR)  Appropriate reflux lifestyle modifications have been discussed and provided in written form.  A prescription has been provided for omeprazole 40 mg daily, 30 minutes prior to breakfast.  If symptoms improve, may decrease dose to 20 mg daily, 30 minutes prior to breakfast.  Mild persistent asthma Well-controlled, we will stepdown therapy at this time.  A prescription has been provided for Arnuity Ellipta 100 g, 1 inhalation daily.  During upper respiratory tract infections and asthma flares, resume Brio Ellipta 100-25 g, 1 inhalation daily, until symptoms have returned to baseline.  Continue albuterol HFA, 1 to 2 inhalations every 6 hours if needed.  Subjective and objective measures of pulmonary function will be followed and the treatment plan will be adjusted accordingly.   Meds ordered this encounter  Medications  . Fluticasone Furoate (ARNUITY ELLIPTA) 100 MCG/ACT AEPB    Sig: Inhale 1 puff into the lungs daily.    Dispense:  1 each    Refill:  5  . albuterol (PROAIR HFA) 108 (90 Base) MCG/ACT inhaler    Sig: Inhale 2 puffs into the lungs every 4 (four) hours as needed for wheezing or shortness of breath.    Dispense:  1 Inhaler    Refill:  1  . Carbinoxamine Maleate 4 MG TABS    Sig: One tablet every 6-8 hours if needed    Dispense:  60 each    Refill:  5  . omeprazole (PRILOSEC) 20 MG capsule    Sig: Two capsules once a day, 30 minutes prior to breakfast.    Dispense:  60 capsule  Refill:  5  . Azelastine HCl 0.15 % SOLN    Sig: 1-2 sprays each nostril twice a day as needed.    Dispense:  30 mL    Refill:  5  . EPINEPHrine (AUVI-Q) 0.3 mg/0.3 mL IJ SOAJ injection    Sig: Use as directed for severe allergic reaction    Dispense:  2 Device    Refill:  2    Diagnostics: Spirometry:  Normal with an FEV1 of 90% predicted and an FEV1 ratio of 103%.  Please  see scanned spirometry results for details.    Physical examination: Blood pressure 130/90, pulse 70, temperature 97.8 F (36.6 C), temperature source Oral, resp. rate 16, height 5' 7"  (1.702 m), weight 265 lb 14 oz (120.6 kg), SpO2 96 %.  General: Alert, interactive, in no acute distress. HEENT: TMs pearly gray, turbinates minimally edematous without discharge, post-pharynx moderately erythematous. Neck: Supple without lymphadenopathy. Lungs: Clear to auscultation without wheezing, rhonchi or rales. CV: Normal S1, S2 without murmurs. Skin: Warm and dry, without lesions or rashes.  The following portions of the patient's history were reviewed and updated as appropriate: allergies, current medications, past family history, past medical history, past social history, past surgical history and problem list.  Allergies as of 11/24/2017      Reactions   Beef-derived Products Anaphylaxis   Lambs Quarters Anaphylaxis   Has not had but is warned by allergist that could have anaphylaxis.   Other Anaphylaxis   **SEAFOOD**   Pork-derived Products Anaphylaxis   Bee Venom Swelling   Latex    Pollen Extract       Medication List        Accurate as of 11/24/17  4:46 PM. Always use your most recent med list.          albuterol 108 (90 Base) MCG/ACT inhaler Commonly known as:  PROAIR HFA Inhale 2 puffs into the lungs every 4 (four) hours as needed for wheezing or shortness of breath.   albuterol 108 (90 Base) MCG/ACT inhaler Commonly known as:  PROAIR HFA Inhale 2 puffs into the lungs every 4 (four) hours as needed for wheezing or shortness of breath.   aspirin 81 MG tablet Take 81 mg by mouth daily. TAKES 2 PILLS ONCE DAILY   Azelastine HCl 0.15 % Soln 1-2 sprays each nostril twice a day as needed.   BREO ELLIPTA 100-25 MCG/INH Aepb Generic drug:  fluticasone furoate-vilanterol Inhale 1 puff into the lungs daily.   Carbinoxamine Maleate 4 MG Tabs One tablet every 6-8 hours if  needed   clobetasol cream 0.05 % Commonly known as:  TEMOVATE APPLY TOPICALLY AA BID   EPINEPHrine 0.3 mg/0.3 mL Soaj injection Commonly known as:  EPIPEN 2-PAK USE AS DIRECTED FOR SEVERE ALLERGIC REACTION   EPINEPHrine 0.3 mg/0.3 mL Soaj injection Commonly known as:  AUVI-Q Use as directed for severe allergic reaction   fluconazole 200 MG tablet Commonly known as:  DIFLUCAN   Fluticasone Furoate 100 MCG/ACT Aepb Commonly known as:  ARNUITY ELLIPTA Inhale 1 puff into the lungs daily.   losartan 50 MG tablet Commonly known as:  COZAAR TK 1 T PO  BID   metFORMIN 1000 MG tablet Commonly known as:  GLUCOPHAGE Take 1 tablet by mouth 2 (two) times daily.   MILK THISTLE PO Take by mouth.   MIRENA IU by Intrauterine route. IUD   montelukast 10 MG tablet Commonly known as:  SINGULAIR Take 1 tablet (10 mg total) by mouth at  bedtime.   MULTIVITAMIN ADULTS PO Take by mouth.   multivitamin tablet Take by mouth daily.   NAC PO Take by mouth.   NON FORMULARY   omeprazole 20 MG capsule Commonly known as:  PRILOSEC Two capsules once a day, 30 minutes prior to breakfast.   OPTIMAG 125 PO Take by mouth.   potassium chloride 10 MEQ tablet Commonly known as:  K-DUR,KLOR-CON TK 1 T PO BID WF   Vitamin D3 50000 units Tabs Take by mouth.       Allergies  Allergen Reactions  . Beef-Derived Products Anaphylaxis  . Lambs Quarters Anaphylaxis    Has not had but is warned by allergist that could have anaphylaxis.  . Other Anaphylaxis    **SEAFOOD**  . Pork-Derived Products Anaphylaxis  . Bee Venom Swelling  . Latex   . Pollen Extract    Review of systems: Review of systems negative except as noted in HPI / PMHx or noted below: Constitutional: Negative.  HENT: Negative.   Eyes: Negative.  Respiratory: Negative.   Cardiovascular: Negative.  Gastrointestinal: Negative.  Genitourinary: Negative.  Musculoskeletal: Negative.  Neurological: Negative.    Endo/Heme/Allergies: Negative.  Cutaneous: Negative.  Past Medical History:  Diagnosis Date  . Hypertension    asthma related no medication taken  . Plantar fasciitis 2007  . Thigh DVT (deep venous thrombosis) ([facility]) 1995    Family History  Problem Relation Age of Onset  . Arthritis Mother   . Hypertension Mother   . Diabetes Mother   . Allergic rhinitis Neg Hx   . Angioedema Neg Hx   . Eczema Neg Hx   . Asthma Neg Hx   . Immunodeficiency Neg Hx   . Urticaria Neg Hx     Social History   Socioeconomic History  . Marital status: Married    Spouse name: Not on file  . Number of children: Not on file  . Years of education: Not on file  . Highest education level: Not on file  Occupational History  . Not on file  Social Needs  . Financial resource strain: Not on file  . Food insecurity:    Worry: Not on file    Inability: Not on file  . Transportation needs:    Medical: Not on file    Non-medical: Not on file  Tobacco Use  . Smoking status: Never Smoker  . Smokeless tobacco: Never Used  Substance and Sexual Activity  . Alcohol use: Yes    Alcohol/week: 0.0 oz    Comment: 1 glasses a month, occasionally  . Drug use: No  . Sexual activity: Yes    Partners: Male    Birth control/protection: IUD, Other-see comments    Comment: vasectomy/Mirena IUD inserted 11-19-11  Lifestyle  . Physical activity:    Days per week: Not on file    Minutes per session: Not on file  . Stress: Not on file  Relationships  . Social connections:    Talks on phone: Not on file    Gets together: Not on file    Attends religious service: Not on file    Active member of club or organization: Not on file    Attends meetings of clubs or organizations: Not on file    Relationship status: Not on file  . Intimate partner violence:    Fear of current or ex partner: Not on file    Emotionally abused: Not on file    Physically abused: Not on file    Forced  sexual activity: Not on file  Other  Topics Concern  . Not on file  Social History Narrative  . Not on file    I appreciate the opportunity to take part in Jaclin's care. Please do not hesitate to contact me with questions.  Sincerely,   R. Edgar Frisk, MD

## 2017-11-24 NOTE — Assessment & Plan Note (Signed)
   Appropriate reflux lifestyle modifications have been discussed and provided in written form.  A prescription has been provided for omeprazole 40 mg daily, 30 minutes prior to breakfast.  If symptoms improve, may decrease dose to 20 mg daily, 30 minutes prior to breakfast.

## 2017-11-24 NOTE — Assessment & Plan Note (Signed)
   A lab order form has been provided for serum specific IgE against alpha gal panel.  For now, continue avoidance of mammalian meat, shellfish, and fish, and have access to epinephrine autoinjectors.  Food allergy action plan is in place.

## 2017-11-29 ENCOUNTER — Ambulatory Visit (INDEPENDENT_AMBULATORY_CARE_PROVIDER_SITE_OTHER): Payer: BLUE CROSS/BLUE SHIELD | Admitting: *Deleted

## 2017-11-29 DIAGNOSIS — J309 Allergic rhinitis, unspecified: Secondary | ICD-10-CM

## 2017-11-29 LAB — ALPHA-GAL PANEL
Alpha Gal IgE*: <0.1 kU/L (ref <0.10)
Beef (Bos spp) IgE: 0.72 kU/L — ABNORMAL HIGH (ref <0.35)
Class Interpretation: 2
Class Interpretation: 2
Class Interpretation: 2
Lamb/Mutton (Ovis spp) IgE: 0.71 kU/L — ABNORMAL HIGH (ref <0.35)
Pork (Sus spp) IgE: 0.98 kU/L — ABNORMAL HIGH (ref <0.35)

## 2017-11-30 ENCOUNTER — Other Ambulatory Visit: Payer: Self-pay

## 2017-11-30 ENCOUNTER — Ambulatory Visit (INDEPENDENT_AMBULATORY_CARE_PROVIDER_SITE_OTHER): Payer: BLUE CROSS/BLUE SHIELD | Admitting: Obstetrics and Gynecology

## 2017-11-30 ENCOUNTER — Encounter: Payer: Self-pay | Admitting: Obstetrics and Gynecology

## 2017-11-30 VITALS — BP 128/74 | HR 70 | Resp 16 | Ht 67.25 in | Wt 267.0 lb

## 2017-11-30 DIAGNOSIS — Z01419 Encounter for gynecological examination (general) (routine) without abnormal findings: Secondary | ICD-10-CM

## 2017-11-30 DIAGNOSIS — N9089 Other specified noninflammatory disorders of vulva and perineum: Secondary | ICD-10-CM

## 2017-11-30 DIAGNOSIS — Z3009 Encounter for other general counseling and advice on contraception: Secondary | ICD-10-CM

## 2017-11-30 NOTE — Progress Notes (Signed)
52 y.o. G33P4004 Married Caucasian female here for annual exam.    Care from [facility].  She brought in a copy of her comprehensive lab work through this provider, and it will be scanned in to [facility].   [place] 42.7 on 10/21/17.  She is not on any hormonal treatment.  Has Hx of DVT.  Last time she had a period was several years ago.  Last time she had spotting was last year.  Has Mirena IUD which is now at the 6 year mark of use.  Decreased interest in sex and vaginal dryness.  Vit D 92.6.  States she is actively under care for vit D from her integrative MD and that they are intentionally supplementing for her to receive this high level.  PCP:   Dr. Jeremy Johann & Dr Berneta Levins  No LMP recorded. (Menstrual status: IUD).           Sexually active: Yes.    The current method of family planning is vasectomy & IUD expired 6/18  Exercising: No.  exercise Smoker:  no  Health Maintenance: Pap:  11-07-15 neg HPV HR neg History of abnormal Pap:  no MMG:  08-05-17 waiting on mmg fax.  [facility].  Colonoscopy:  2015 f/u 60yr BMD:   n/a  Result  n/a TDaP:  2018 HIV: done yrs ago with pregnancy Hep C: unsure if done Screening Labs:  ----   reports that she has never smoked. She has never used smokeless tobacco. She reports that she drinks alcohol. She reports that she does not use drugs.  Past Medical History:  Diagnosis Date  . Hashimoto's disease   . Hypertension    asthma related no medication taken  . Plantar fasciitis 2007  . Thigh DVT (deep venous thrombosis) ([place] 1995    Past Surgical History:  Procedure Laterality Date  . HAND SURGERY Right 2006  . KNEE ARTHROSCOPY Left 04/2010  . LAPAROSCOPY  1991  . SHOULDER ARTHROSCOPY  2004    Current Outpatient Medications  Medication Sig Dispense Refill  . Acetylcysteine (NAC PO) Take by mouth.    . Albuterol Sulfate (PROVENTIL HFA IN) Inhale into the lungs.    . Ascorbic Acid (VITAMIN C) POWD Take by mouth. 5tsp prn     . aspirin 81 MG tablet Take 81 mg by mouth daily. TAKES 2 PILLS ONCE DAILY    . Azelastine HCl 0.15 % SOLN 1-2 sprays each nostril twice a day as needed. 30 mL 5  . BREO ELLIPTA 100-25 MCG/INH AEPB Inhale 1 puff into the lungs daily. 3 each 0  . Carbinoxamine Maleate 4 MG TABS One tablet every 6-8 hours if needed 60 each 5  . fluconazole (DIFLUCAN) 200 MG tablet   1  . Fluticasone Furoate (ARNUITY ELLIPTA) 100 MCG/ACT AEPB Inhale 1 puff into the lungs daily. 1 each 5  . Levonorgestrel (MIRENA IU) by Intrauterine route. IUD    . losartan (COZAAR) 50 MG tablet TK 1 T PO  BID  5  . Magnesium (OPTIMAG 125 PO) Take by mouth.    . metFORMIN (GLUCOPHAGE) 1000 MG tablet Take 1 tablet by mouth 2 (two) times daily.  3  . MILK THISTLE PO Take by mouth.    .Marland Kitchenomeprazole (PRILOSEC) 20 MG capsule Two capsules once a day, 30 minutes prior to breakfast. 60 capsule 5  . potassium chloride (K-DUR,KLOR-CON) 10 MEQ tablet TK 1 T PO BID WF  3  . UNABLE TO FIND Interfase plus takes  2 capsules daily    . UNABLE TO FIND G.I Detox twice daily    . UNABLE TO FIND ALAmax CR    . UNABLE TO FIND DopaPlus Tyrosine    . UNABLE TO FIND 5HTP CR 139m 1 tab twice daily    . UNABLE TO FIND Chromate GTF Cromium polynicotinate 6044m2 capsules twice daily    . UNABLE TO FIND Metabolic synergy 2 capsules twice daily    . UNABLE TO FIND Low dose naltrexone 4.62m88m at bedtime    . UNABLE TO FIND Nasal saline lavage    . Vitamin D, Ergocalciferol, (DRISDOL) 50000 units CAPS capsule Take 50,000 Units by mouth. Every 6 days     No current facility-administered medications for this visit.     Family History  Problem Relation Age of Onset  . Arthritis Mother   . Hypertension Mother   . Diabetes Mother   . Allergic rhinitis Neg Hx   . Angioedema Neg Hx   . Eczema Neg Hx   . Asthma Neg Hx   . Immunodeficiency Neg Hx   . Urticaria Neg Hx     Review of Systems  Constitutional: Negative.   HENT: Negative.   Eyes:  Negative.   Respiratory: Negative.   Cardiovascular: Negative.   Gastrointestinal: Negative.        Bloating  Endocrine: Positive for cold intolerance and heat intolerance.  Genitourinary: Negative.        Loss of sexual interest Vaginal dryness  Musculoskeletal: Negative.   Skin: Negative.   Allergic/Immunologic: Negative.   Neurological: Negative.   Psychiatric/Behavioral: Negative.     Exam:   BP 128/74   Pulse 70   Resp 16   Ht 5' 7.25" (1.708 m)   Wt 267 lb (121.1 kg)   BMI 41.51 kg/m     General appearance: alert, cooperative and appears stated age Head: Normocephalic, without obvious abnormality, atraumatic Neck: no adenopathy, supple, symmetrical, trachea midline and thyroid normal to inspection and palpation Lungs: clear to auscultation bilaterally Breasts: normal appearance, no masses or tenderness, No nipple retraction or dimpling, No nipple discharge or bleeding, No axillary or supraclavicular adenopathy Heart: regular rate and rhythm Abdomen: soft, non-tender; no masses, no organomegaly Extremities: extremities normal, atraumatic, no cyanosis or edema Skin: Skin color, texture, turgor normal. No rashes or lesions Lymph nodes: Cervical, supraclavicular, and axillary nodes normal. No abnormal inguinal nodes palpated Neurologic: Grossly normal  Pelvic: External genitalia:  3 mm firm dark purple, nonblanching left labia majora lesion.              Urethra:  normal appearing urethra with no masses, tenderness or lesions              Bartholins and Skenes: normal                 Vagina: normal appearing vagina with normal color and discharge, no lesions              Cervix: no lesions. IUD strings noted.               Pap taken: No. Bimanual Exam:  Uterus:  normal size, contour, position, consistency, mobility, non-tender              Adnexa: no mass, fullness, tenderness              Rectal exam: Yes.  .  Confirms.              Anus:  normal sphincter tone, no  lesions  Chaperone was present for exam.  Assessment:   Well woman visit with normal exam. Expired Mirena IUD, now in for 6 years.   Elevated FSH.  Hx DVT.  Vulvar lesion. Hyperlipidemia.  Elevated ALT.  High dose vitamin D treatment.  New dx Hashimotos.   Plan: Mammogram screening. Recommended self breast awareness. Pap and HR HPV as above. Guidelines for Calcium, Vitamin D, regular exercise program including cardiovascular and weight bearing exercise. Return for IUD removal and excision of left labia majora lesion.  We briefly discussed potential increased risk of pancreatic cancer with high vitamin D levels.  Patient is comfortable with her plan with her functional medicine provider.   Follow up annually and prn.   After visit summary provided.

## 2017-11-30 NOTE — Patient Instructions (Signed)

## 2017-12-05 ENCOUNTER — Telehealth: Payer: Self-pay | Admitting: Obstetrics and Gynecology

## 2017-12-05 NOTE — Telephone Encounter (Signed)
Call placed to convey benefits for biopsy procedure.

## 2017-12-06 ENCOUNTER — Ambulatory Visit (INDEPENDENT_AMBULATORY_CARE_PROVIDER_SITE_OTHER): Payer: BLUE CROSS/BLUE SHIELD | Admitting: *Deleted

## 2017-12-06 DIAGNOSIS — J309 Allergic rhinitis, unspecified: Secondary | ICD-10-CM

## 2017-12-09 DIAGNOSIS — M7541 Impingement syndrome of right shoulder: Secondary | ICD-10-CM | POA: Diagnosis not present

## 2017-12-09 DIAGNOSIS — M5126 Other intervertebral disc displacement, lumbar region: Secondary | ICD-10-CM | POA: Diagnosis not present

## 2017-12-09 DIAGNOSIS — M1711 Unilateral primary osteoarthritis, right knee: Secondary | ICD-10-CM | POA: Diagnosis not present

## 2017-12-09 DIAGNOSIS — G5601 Carpal tunnel syndrome, right upper limb: Secondary | ICD-10-CM | POA: Diagnosis not present

## 2017-12-13 ENCOUNTER — Ambulatory Visit (INDEPENDENT_AMBULATORY_CARE_PROVIDER_SITE_OTHER): Payer: BLUE CROSS/BLUE SHIELD | Admitting: *Deleted

## 2017-12-13 ENCOUNTER — Encounter: Payer: Self-pay | Admitting: Obstetrics and Gynecology

## 2017-12-13 DIAGNOSIS — J309 Allergic rhinitis, unspecified: Secondary | ICD-10-CM | POA: Diagnosis not present

## 2017-12-20 ENCOUNTER — Ambulatory Visit (INDEPENDENT_AMBULATORY_CARE_PROVIDER_SITE_OTHER): Payer: BLUE CROSS/BLUE SHIELD | Admitting: *Deleted

## 2017-12-20 DIAGNOSIS — J309 Allergic rhinitis, unspecified: Secondary | ICD-10-CM | POA: Diagnosis not present

## 2017-12-23 ENCOUNTER — Telehealth: Payer: Self-pay | Admitting: *Deleted

## 2017-12-23 MED ORDER — FLUTICASONE FUROATE 100 MCG/ACT IN AEPB: 1.0000 | INHALATION_SPRAY | Freq: Every day | RESPIRATORY_TRACT | 3 refills | Status: DC

## 2017-12-23 NOTE — Telephone Encounter (Signed)
Pt called and said she needed 90 day supply for her arnuity inhaler. Med sent.

## 2017-12-26 ENCOUNTER — Encounter: Payer: Self-pay | Admitting: Obstetrics and Gynecology

## 2017-12-26 ENCOUNTER — Ambulatory Visit: Payer: BLUE CROSS/BLUE SHIELD | Admitting: Obstetrics and Gynecology

## 2017-12-26 ENCOUNTER — Other Ambulatory Visit: Payer: Self-pay

## 2017-12-26 VITALS — BP 118/70 | HR 80 | Resp 16 | Ht 67.25 in | Wt 266.0 lb

## 2017-12-26 DIAGNOSIS — Z3009 Encounter for other general counseling and advice on contraception: Secondary | ICD-10-CM

## 2017-12-26 DIAGNOSIS — N9089 Other specified noninflammatory disorders of vulva and perineum: Secondary | ICD-10-CM

## 2017-12-26 NOTE — Progress Notes (Signed)
GYNECOLOGY  VISIT   HPI: 52 y.o.   Married  Caucasian  female   [phone] with No LMP recorded (lmp unknown). (Menstrual status: IUD).   here for  IUD removal and vulvar biopsy.   Mirena IUD placed on 11/19/11. [place] 42.7 on 10/21/17 at outside lab.  Has a dark purple nonblanching left labia majora lesion.   Review of Systems  Constitutional: Negative.   HENT: Negative.   Eyes: Negative.   Respiratory: Negative.   Cardiovascular: Negative.   Gastrointestinal: Positive for blood in stool and constipation.  had in the past.  Told she has polyps.  Endocrine: Negative.   Genitourinary: Negative.   Musculoskeletal: Negative.   Skin: Negative.   Allergic/Immunologic: Negative.   Neurological: Negative.   Hematological: Negative.   Psychiatric/Behavioral: Negative.    GYNECOLOGIC HISTORY: No LMP recorded (lmp unknown). (Menstrual status: IUD). Contraception:  Vasectomy and Mirena IUD.  Menopausal hormone therapy:  NA Last mammogram:  08/05/17 - [facility].  Last pap smear:  11-07-15 neg HPV HR neg        OB History    Gravida  4   Para  4   Term  4   Preterm      AB      Living  4     SAB      TAB      Ectopic      Multiple      Live Births  4              Patient Active Problem List   Diagnosis Date Noted  . Coughing 11/24/2017  . Globus sensation 11/24/2017  . Laryngopharyngeal reflux (LPR) 11/24/2017  . Allergic reaction 03/02/2017  . Contact dermatitis due to chemicals 09/22/2016  . Allergic rhinitis 09/01/2015  . Mild persistent asthma 09/01/2015  . Food Allergy 09/01/2015  . Cystocele 11/02/2013    Past Medical History:  Diagnosis Date  . Elevated alkaline phosphatase level 10/21/2017   138 - Dr. Geryl Rankins  . Elevated ALT measurement 10/21/2017   62 - Dr. Theador Hawthorne  . Elevated cholesterol 10/21/2017   Dr. Geryl Rankins  . Hashimoto's disease   . Hypertension    asthma related no medication taken  . Plantar fasciitis 2007  . Thigh DVT (deep venous  thrombosis) ([place]) 1995    Past Surgical History:  Procedure Laterality Date  . HAND SURGERY Right 2006  . KNEE ARTHROSCOPY Left 04/2010  . LAPAROSCOPY  1991  . SHOULDER ARTHROSCOPY  2004    Current Outpatient Medications  Medication Sig Dispense Refill  . Acetylcysteine (NAC PO) Take by mouth.    . Albuterol Sulfate (PROVENTIL HFA IN) Inhale into the lungs.    . Ascorbic Acid (VITAMIN C) POWD Take by mouth. 5tsp prn    . aspirin 81 MG tablet Take 81 mg by mouth daily. TAKES 2 PILLS ONCE DAILY    . Azelastine HCl 0.15 % SOLN 1-2 sprays each nostril twice a day as needed. 30 mL 5  . BREO ELLIPTA 100-25 MCG/INH AEPB Inhale 1 puff into the lungs daily. 3 each 0  . Carbinoxamine Maleate 4 MG TABS One tablet every 6-8 hours if needed 60 each 5  . fluconazole (DIFLUCAN) 200 MG tablet   1  . Fluticasone Furoate (ARNUITY ELLIPTA) 100 MCG/ACT AEPB Inhale 1 puff into the lungs daily. 3 each 3  . Levonorgestrel (MIRENA IU) by Intrauterine route. IUD    . losartan (COZAAR) 50 MG tablet TK 1  T PO  BID  5  . Magnesium (OPTIMAG 125 PO) Take by mouth.    . metFORMIN (GLUCOPHAGE) 1000 MG tablet Take 1 tablet by mouth 2 (two) times daily.  3  . MILK THISTLE PO Take by mouth.    . NON FORMULARY Hemp Symmetry  - one dropper full po at bedtime    . omeprazole (PRILOSEC) 20 MG capsule Two capsules once a day, 30 minutes prior to breakfast. 60 capsule 5  . potassium chloride (K-DUR,KLOR-CON) 10 MEQ tablet TK 1 T PO BID WF  3  . UNABLE TO FIND Interfase plus takes 2 capsules daily    . UNABLE TO FIND G.I Detox twice daily    . UNABLE TO FIND ALAmax CR    . UNABLE TO FIND DopaPlus Tyrosine    . UNABLE TO FIND 5HTP CR 131m 1 tab twice daily    . UNABLE TO FIND Chromate GTF Cromium polynicotinate 6051m2 capsules twice daily    . UNABLE TO FIND Metabolic synergy 2 capsules twice daily    . UNABLE TO FIND Low dose naltrexone 4.72m58m at bedtime    . UNABLE TO FIND Nasal saline lavage    . Vitamin D,  Ergocalciferol, (DRISDOL) 50000 units CAPS capsule Take 50,000 Units by mouth. Every 6 days     No current facility-administered medications for this visit.      ALLERGIES: Beef-derived products; Lambs quarters; Other; Pork-derived products; Bee venom; Latex; and Pollen extract  Family History  Problem Relation Age of Onset  . Arthritis Mother   . Hypertension Mother   . Diabetes Mother   . Allergic rhinitis Neg Hx   . Angioedema Neg Hx   . Eczema Neg Hx   . Asthma Neg Hx   . Immunodeficiency Neg Hx   . Urticaria Neg Hx     Social History   Socioeconomic History  . Marital status: Married    Spouse name: Not on file  . Number of children: Not on file  . Years of education: Not on file  . Highest education level: Not on file  Occupational History  . Not on file  Social Needs  . Financial resource strain: Not on file  . Food insecurity:    Worry: Not on file    Inability: Not on file  . Transportation needs:    Medical: Not on file    Non-medical: Not on file  Tobacco Use  . Smoking status: Never Smoker  . Smokeless tobacco: Never Used  Substance and Sexual Activity  . Alcohol use: Yes    Alcohol/week: 0.0 - 0.6 oz  . Drug use: No  . Sexual activity: Yes    Partners: Male    Birth control/protection: IUD, Other-see comments    Comment: vasectomy/Mirena IUD inserted 11-19-11  Lifestyle  . Physical activity:    Days per week: Not on file    Minutes per session: Not on file  . Stress: Not on file  Relationships  . Social connections:    Talks on phone: Not on file    Gets together: Not on file    Attends religious service: Not on file    Active member of club or organization: Not on file    Attends meetings of clubs or organizations: Not on file    Relationship status: Not on file  . Intimate partner violence:    Fear of current or ex partner: Not on file    Emotionally abused: Not on file  Physically abused: Not on file    Forced sexual activity: Not on  file  Other Topics Concern  . Not on file  Social History Narrative  . Not on file   PHYSICAL EXAMINATION:    BP 118/70 (BP Location: Right Arm, Patient Position: Sitting, Cuff Size: Large)   Pulse 80   Resp 16   Ht 5' 7.25" (1.708 m)   Wt 266 lb (120.7 kg)   LMP  (LMP Unknown)   BMI 41.35 kg/m     General appearance: alert, cooperative and appears stated age   Pelvic: External genitalia:  no lesions.  Varicose veins of the right labia majora.  Small hemangiomas of the right labia minora.  Left labia majora no lesion noted.               Urethra:  normal appearing urethra with no masses, tenderness or lesions              Bartholins and Skenes: normal                 Vagina: normal appearing vagina with normal color and discharge, no lesions              Cervix: no lesions.  One IUD string noted.                 IUD removal  Consent for procedure.  Cervix sterilized with Hibiclens.  Rings forceps used to remove IUD, intact, shown to patient, and discarded.   Chaperone was present for exam.  ASSESSMENT  Expired IUD - removed.  Vulvar varicose veins.  Left vulvar lesion resolved.  Thrombus of varicose vein? Constipation and blood in stool.  PLAN  Reassurance regarding appearance of vulva.  Patient advised to contact her GI to determine if she needs anything further.  FU for annual exam and prn.   An After Visit Summary was printed and given to the patient.  __15____ minutes face to face time of which over 50% was spent in counseling.

## 2017-12-26 NOTE — Progress Notes (Signed)
Opened in error

## 2017-12-29 DIAGNOSIS — R634 Abnormal weight loss: Secondary | ICD-10-CM | POA: Diagnosis not present

## 2017-12-29 DIAGNOSIS — Z8672 Personal history of thrombophlebitis: Secondary | ICD-10-CM | POA: Diagnosis not present

## 2017-12-29 DIAGNOSIS — L2084 Intrinsic (allergic) eczema: Secondary | ICD-10-CM | POA: Diagnosis not present

## 2017-12-29 DIAGNOSIS — R635 Abnormal weight gain: Secondary | ICD-10-CM | POA: Diagnosis not present

## 2017-12-29 DIAGNOSIS — E785 Hyperlipidemia, unspecified: Secondary | ICD-10-CM | POA: Diagnosis not present

## 2017-12-29 DIAGNOSIS — E1165 Type 2 diabetes mellitus with hyperglycemia: Secondary | ICD-10-CM | POA: Diagnosis not present

## 2018-01-09 ENCOUNTER — Ambulatory Visit (INDEPENDENT_AMBULATORY_CARE_PROVIDER_SITE_OTHER): Payer: BLUE CROSS/BLUE SHIELD

## 2018-01-09 DIAGNOSIS — M7541 Impingement syndrome of right shoulder: Secondary | ICD-10-CM | POA: Diagnosis not present

## 2018-01-09 DIAGNOSIS — J309 Allergic rhinitis, unspecified: Secondary | ICD-10-CM | POA: Diagnosis not present

## 2018-01-09 DIAGNOSIS — G5601 Carpal tunnel syndrome, right upper limb: Secondary | ICD-10-CM | POA: Diagnosis not present

## 2018-01-09 DIAGNOSIS — M5126 Other intervertebral disc displacement, lumbar region: Secondary | ICD-10-CM | POA: Diagnosis not present

## 2018-01-09 DIAGNOSIS — M1711 Unilateral primary osteoarthritis, right knee: Secondary | ICD-10-CM | POA: Diagnosis not present

## 2018-01-17 ENCOUNTER — Encounter: Payer: Self-pay | Admitting: Obstetrics and Gynecology

## 2018-01-24 ENCOUNTER — Ambulatory Visit (INDEPENDENT_AMBULATORY_CARE_PROVIDER_SITE_OTHER): Payer: BLUE CROSS/BLUE SHIELD

## 2018-01-24 DIAGNOSIS — J309 Allergic rhinitis, unspecified: Secondary | ICD-10-CM

## 2018-02-03 DIAGNOSIS — G5601 Carpal tunnel syndrome, right upper limb: Secondary | ICD-10-CM | POA: Diagnosis not present

## 2018-02-03 DIAGNOSIS — M5126 Other intervertebral disc displacement, lumbar region: Secondary | ICD-10-CM | POA: Diagnosis not present

## 2018-02-03 DIAGNOSIS — G5602 Carpal tunnel syndrome, left upper limb: Secondary | ICD-10-CM | POA: Diagnosis not present

## 2018-02-03 DIAGNOSIS — M1711 Unilateral primary osteoarthritis, right knee: Secondary | ICD-10-CM | POA: Diagnosis not present

## 2018-02-07 ENCOUNTER — Ambulatory Visit (INDEPENDENT_AMBULATORY_CARE_PROVIDER_SITE_OTHER): Payer: BLUE CROSS/BLUE SHIELD | Admitting: *Deleted

## 2018-02-07 DIAGNOSIS — J309 Allergic rhinitis, unspecified: Secondary | ICD-10-CM | POA: Diagnosis not present

## 2018-02-21 ENCOUNTER — Ambulatory Visit (INDEPENDENT_AMBULATORY_CARE_PROVIDER_SITE_OTHER): Payer: BLUE CROSS/BLUE SHIELD | Admitting: *Deleted

## 2018-02-21 DIAGNOSIS — J309 Allergic rhinitis, unspecified: Secondary | ICD-10-CM

## 2018-02-22 ENCOUNTER — Encounter: Payer: Self-pay | Admitting: *Deleted

## 2018-02-22 NOTE — Progress Notes (Signed)
Vials made. Exp: 02-23-19. hv

## 2018-02-24 DIAGNOSIS — J3089 Other allergic rhinitis: Secondary | ICD-10-CM | POA: Diagnosis not present

## 2018-03-15 DIAGNOSIS — M5126 Other intervertebral disc displacement, lumbar region: Secondary | ICD-10-CM | POA: Diagnosis not present

## 2018-03-15 DIAGNOSIS — M1711 Unilateral primary osteoarthritis, right knee: Secondary | ICD-10-CM | POA: Diagnosis not present

## 2018-03-15 DIAGNOSIS — G5601 Carpal tunnel syndrome, right upper limb: Secondary | ICD-10-CM | POA: Diagnosis not present

## 2018-03-15 DIAGNOSIS — G5602 Carpal tunnel syndrome, left upper limb: Secondary | ICD-10-CM | POA: Diagnosis not present

## 2018-03-21 ENCOUNTER — Ambulatory Visit (INDEPENDENT_AMBULATORY_CARE_PROVIDER_SITE_OTHER): Payer: BLUE CROSS/BLUE SHIELD | Admitting: *Deleted

## 2018-03-21 DIAGNOSIS — J309 Allergic rhinitis, unspecified: Secondary | ICD-10-CM

## 2018-03-27 ENCOUNTER — Other Ambulatory Visit: Payer: Self-pay | Admitting: Family Medicine

## 2018-03-27 DIAGNOSIS — K769 Liver disease, unspecified: Secondary | ICD-10-CM

## 2018-03-27 DIAGNOSIS — K7581 Nonalcoholic steatohepatitis (NASH): Secondary | ICD-10-CM

## 2018-03-31 ENCOUNTER — Other Ambulatory Visit: Payer: Self-pay

## 2018-04-04 ENCOUNTER — Ambulatory Visit (INDEPENDENT_AMBULATORY_CARE_PROVIDER_SITE_OTHER): Payer: BLUE CROSS/BLUE SHIELD | Admitting: *Deleted

## 2018-04-04 DIAGNOSIS — J309 Allergic rhinitis, unspecified: Secondary | ICD-10-CM

## 2018-04-05 ENCOUNTER — Ambulatory Visit
Admission: RE | Admit: 2018-04-05 | Discharge: 2018-04-05 | Disposition: A | Discharge status: Home / Self Care | Payer: BLUE CROSS/BLUE SHIELD | Source: Ambulatory Visit | Attending: Family Medicine | Admitting: Family Medicine

## 2018-04-05 DIAGNOSIS — K769 Liver disease, unspecified: Secondary | ICD-10-CM

## 2018-04-05 DIAGNOSIS — K7581 Nonalcoholic steatohepatitis (NASH): Secondary | ICD-10-CM

## 2018-04-05 DIAGNOSIS — K5781 Diverticulitis of intestine, part unspecified, with perforation and abscess with bleeding: Secondary | ICD-10-CM | POA: Diagnosis not present

## 2018-04-05 DIAGNOSIS — K7689 Other specified diseases of liver: Secondary | ICD-10-CM | POA: Diagnosis not present

## 2018-04-10 DIAGNOSIS — F419 Anxiety disorder, unspecified: Secondary | ICD-10-CM | POA: Diagnosis not present

## 2018-04-10 DIAGNOSIS — E785 Hyperlipidemia, unspecified: Secondary | ICD-10-CM | POA: Diagnosis not present

## 2018-04-10 DIAGNOSIS — R635 Abnormal weight gain: Secondary | ICD-10-CM | POA: Diagnosis not present

## 2018-04-10 DIAGNOSIS — E1165 Type 2 diabetes mellitus with hyperglycemia: Secondary | ICD-10-CM | POA: Diagnosis not present

## 2018-04-10 DIAGNOSIS — B3782 Candidal enteritis: Secondary | ICD-10-CM | POA: Diagnosis not present

## 2018-04-11 ENCOUNTER — Ambulatory Visit (INDEPENDENT_AMBULATORY_CARE_PROVIDER_SITE_OTHER): Payer: BLUE CROSS/BLUE SHIELD | Admitting: *Deleted

## 2018-04-11 DIAGNOSIS — J309 Allergic rhinitis, unspecified: Secondary | ICD-10-CM | POA: Diagnosis not present

## 2018-04-12 DIAGNOSIS — M1711 Unilateral primary osteoarthritis, right knee: Secondary | ICD-10-CM | POA: Diagnosis not present

## 2018-04-12 DIAGNOSIS — M5126 Other intervertebral disc displacement, lumbar region: Secondary | ICD-10-CM | POA: Diagnosis not present

## 2018-04-12 DIAGNOSIS — G5602 Carpal tunnel syndrome, left upper limb: Secondary | ICD-10-CM | POA: Diagnosis not present

## 2018-04-12 DIAGNOSIS — G5601 Carpal tunnel syndrome, right upper limb: Secondary | ICD-10-CM | POA: Diagnosis not present

## 2018-04-18 ENCOUNTER — Ambulatory Visit (INDEPENDENT_AMBULATORY_CARE_PROVIDER_SITE_OTHER): Payer: BLUE CROSS/BLUE SHIELD | Admitting: *Deleted

## 2018-04-18 DIAGNOSIS — J309 Allergic rhinitis, unspecified: Secondary | ICD-10-CM

## 2018-04-18 DIAGNOSIS — D485 Neoplasm of uncertain behavior of skin: Secondary | ICD-10-CM | POA: Diagnosis not present

## 2018-04-18 DIAGNOSIS — L300 Nummular dermatitis: Secondary | ICD-10-CM | POA: Diagnosis not present

## 2018-04-25 ENCOUNTER — Ambulatory Visit (INDEPENDENT_AMBULATORY_CARE_PROVIDER_SITE_OTHER): Payer: BLUE CROSS/BLUE SHIELD | Admitting: *Deleted

## 2018-04-25 DIAGNOSIS — J309 Allergic rhinitis, unspecified: Secondary | ICD-10-CM | POA: Diagnosis not present

## 2018-04-26 ENCOUNTER — Encounter: Payer: Self-pay | Admitting: Allergy and Immunology

## 2018-04-26 ENCOUNTER — Ambulatory Visit: Payer: BLUE CROSS/BLUE SHIELD | Admitting: Allergy and Immunology

## 2018-04-26 VITALS — BP 118/82 | HR 70 | Temp 98.7°F | Resp 20

## 2018-04-26 DIAGNOSIS — J4530 Mild persistent asthma, uncomplicated: Secondary | ICD-10-CM

## 2018-04-26 DIAGNOSIS — R0989 Other specified symptoms and signs involving the circulatory and respiratory systems: Secondary | ICD-10-CM

## 2018-04-26 DIAGNOSIS — R198 Other specified symptoms and signs involving the digestive system and abdomen: Secondary | ICD-10-CM

## 2018-04-26 DIAGNOSIS — J3089 Other allergic rhinitis: Secondary | ICD-10-CM | POA: Diagnosis not present

## 2018-04-26 DIAGNOSIS — T7800XD Anaphylactic reaction due to unspecified food, subsequent encounter: Secondary | ICD-10-CM | POA: Diagnosis not present

## 2018-04-26 NOTE — Assessment & Plan Note (Signed)
Well-controlled.  Continue Arnuity Ellipta 100 g, 1 inhalation daily, and albuterol HFA, 1-2 elations every 4-6 hours as needed..  During upper respiratory tract infections and asthma flares, resume Brio Ellipta 100-25 g, 1 inhalation daily, until symptoms have returned to baseline.  Subjective and objective measures of pulmonary function will be followed and the treatment plan will be adjusted accordingly.

## 2018-04-26 NOTE — Progress Notes (Signed)
Follow-up Note  RE: Monica Good MRN: [id number] DOB: March 11, 1966 Date of Office Visit: 04/26/2018  Primary care provider: Carol Ada, MD Referring provider: Carol Ada, MD  History of present illness: Monica Good is a 52 y.o. female with persistent asthma, allergic rhinitis, food allergy, and laryngopharyngeal reflux presenting today for follow-up.  She was last seen in this clinic on November 24, 2017.  She reports that her asthma has been well controlled with Arnuity 100 g, 1 inhalation daily.  She rarely requires albuterol rescue, does not experience limitations in normal daily activities, and does not experience nocturnal awakenings due to lower respiratory symptoms.  She has no nasal allergy symptom complaints today.  She is tolerating aeroallergen immunotherapy buildup injections without problems or complications.   She continues to avoid mammalian meat, shellfish, and fish and has access to epinephrine autoinjectors.  She still experiences a mild globus sensation despite controlling postnasal drainage.  She did not perceive benefit from attempting to control possible silent reflux.  She went to an integrative medicine practitioner who believed that the globus sensation might be secondary to food sensitivities and therefore recommended IgG food panel blood work.  This test revealed positive results to multiple foods which she consumes on a regular basis without notable symptoms.  She plans to remove these foods from her diet while assessing for symptom reduction regarding the globus sensation.  Assessment and plan: Mild persistent asthma Well-controlled.  Continue Arnuity Ellipta 100 g, 1 inhalation daily, and albuterol HFA, 1-2 elations every 4-6 hours as needed..  During upper respiratory tract infections and asthma flares, resume Brio Ellipta 100-25 g, 1 inhalation daily, until symptoms have returned to baseline.  Subjective and objective measures of pulmonary  function will be followed and the treatment plan will be adjusted accordingly.  Allergic rhinitis Stable.  Continue appropriate allergen avoidance measures and immunotherapy injections as prescribed.  If needed, add carbinoxamine and/or azelastine nasal spray.  Nasal saline lavage (NeilMed) has been recommended as needed and prior to medicated nasal sprays along with instructions for proper administration.  Food Allergy  Continue meticulous avoidance of mammalian meat, shellfish, and fish and have access to epinephrine autoinjector 2 pack in case of accidental ingestion.  Food allergy action plan is in place.  Globus sensation No change after controlling postnasal drainage and attempting to control possible silent reflux.  She will assess for symptom change while modifying her diet.  If this problem persists or progresses, further evaluation by otolaryngologist will be recommended.   Diagnostics: Spirometry:  Normal with an FEV1 of 91% predicted.  Please see scanned spirometry results for details.    Physical examination: Blood pressure 118/82, pulse 70, temperature 98.7 F (37.1 C), temperature source Oral, resp. rate 20, SpO2 97 %.  General: Alert, interactive, in no acute distress. HEENT: TMs pearly gray, turbinates minimally edematous without discharge, post-pharynx unremarkable. Neck: Supple without lymphadenopathy. Lungs: Clear to auscultation without wheezing, rhonchi or rales. CV: Normal S1, S2 without murmurs. Skin: Warm and dry, without lesions or rashes.  The following portions of the patient's history were reviewed and updated as appropriate: allergies, current medications, past family history, past medical history, past social history, past surgical history and problem list.  Allergies as of 04/26/2018      Reactions   Beef-derived Products Anaphylaxis   Lambs Quarters Anaphylaxis   Has not had but is warned by allergist that could have anaphylaxis.   Other  Anaphylaxis   **SEAFOOD**   Pork-derived Products  Anaphylaxis   Bee Venom Swelling   Latex    Pollen Extract       Medication List        Accurate as of 04/26/18  5:34 PM. Always use your most recent med list.          aspirin 81 MG tablet Take 81 mg by mouth daily. TAKES 2 PILLS ONCE DAILY   Azelastine HCl 0.15 % Soln 1-2 sprays each nostril twice a day as needed.   BREO ELLIPTA 100-25 MCG/INH Aepb Generic drug:  fluticasone furoate-vilanterol Inhale 1 puff into the lungs daily.   Carbinoxamine Maleate 4 MG Tabs One tablet every 6-8 hours if needed   fluconazole 200 MG tablet Commonly known as:  DIFLUCAN   fluocinonide ointment 0.05 % Commonly known as:  LIDEX APPLY AA ONLY ON LEGS UNTIL CLEAR   Fluticasone Furoate 100 MCG/ACT Aepb Inhale 1 puff into the lungs daily.   losartan 50 MG tablet Commonly known as:  COZAAR TK 1 T PO  BID   metFORMIN 1000 MG tablet Commonly known as:  GLUCOPHAGE Take 500 mg by mouth 2 (two) times daily.   MILK THISTLE PO Take by mouth.   MIRENA IU by Intrauterine route. IUD   NAC PO Take 1 tablet by mouth daily.   NON FORMULARY Hemp Symmetry  - one dropper full po at bedtime   omeprazole 20 MG capsule Commonly known as:  PRILOSEC Two capsules once a day, 30 minutes prior to breakfast.   OPTIMAG 125 PO Take by mouth.   potassium chloride 10 MEQ tablet Commonly known as:  K-DUR,KLOR-CON TK 1 T PO BID WF   PROVENTIL HFA IN Inhale into the lungs.   UNABLE TO FIND Interfase plus takes 2 capsules daily   UNABLE TO FIND G.I Detox twice daily   UNABLE TO FIND ALAmax CR   UNABLE TO FIND DopaPlus Tyrosine   UNABLE TO FIND 5HTP CR 188m 1 tab twice daily   UNABLE TO FIND Chromate GTF Cromium polynicotinate 6031m2 capsules twice daily   UNABLE TO FIND Low dose naltrexone 4.46m79m at bedtime   UNABLE TO FIND Nasal saline lavage   Vitamin C Powd Take by mouth. 5tsp prn   Vitamin D (Ergocalciferol) 1.25  MG (50000 UT) Caps capsule Commonly known as:  DRISDOL Take 50,000 Units by mouth. Every 6 days       Allergies  Allergen Reactions  . Beef-Derived Products Anaphylaxis  . Lambs Quarters Anaphylaxis    Has not had but is warned by allergist that could have anaphylaxis.  . Other Anaphylaxis    **SEAFOOD**  . Pork-Derived Products Anaphylaxis  . Bee Venom Swelling  . Latex   . Pollen Extract    Review of systems: Review of systems negative except as noted in HPI / PMHx or noted below: Constitutional: Negative.  HENT: Negative.   Eyes: Negative.  Respiratory: Negative.   Cardiovascular: Negative.  Gastrointestinal: Negative.  Genitourinary: Negative.  Musculoskeletal: Negative.  Neurological: Negative.  Endo/Heme/Allergies: Negative.  Cutaneous: Negative.  Past Medical History:  Diagnosis Date  . Elevated alkaline phosphatase level 10/21/2017   138 - Dr. A. Geryl Rankins Elevated ALT measurement 10/21/2017   62 - Dr. A. Theador Hawthorne Elevated cholesterol 10/21/2017   Dr. A. Geryl Rankins Hashimoto's disease   . Hypertension    asthma related no medication taken  . Plantar fasciitis 2007  . Thigh D[place] (deep [place] thrombosis) [place]    Family  History  Problem Relation Age of Onset  . Arthritis Mother   . Hypertension Mother   . Diabetes Mother   . Allergic rhinitis Neg Hx   . Angioedema Neg Hx   . Eczema Neg Hx   . Asthma Neg Hx   . Immunodeficiency Neg Hx   . Urticaria Neg Hx     Social History   Socioeconomic History  . Marital status: Married    Spouse name: Not on file  . Number of children: Not on file  . Years of education: Not on file  . Highest education level: Not on file  Occupational History  . Not on file  Social Needs  . Financial resource strain: Not on file  . Food insecurity:    Worry: Not on file    Inability: Not on file  . Transportation needs:    Medical: Not on file    Non-medical: Not on file  Tobacco Use  . Smoking status:  Never Smoker  . Smokeless tobacco: Never Used  Substance and Sexual Activity  . Alcohol use: Yes    Alcohol/week: 0.0 - 1.0 standard drinks  . Drug use: No  . Sexual activity: Yes    Partners: Male    Birth control/protection: IUD, Other-see comments    Comment: vasectomy/Mirena IUD inserted 11-19-11  Lifestyle  . Physical activity:    Days per week: Not on file    Minutes per session: Not on file  . Stress: Not on file  Relationships  . Social connections:    Talks on phone: Not on file    Gets together: Not on file    Attends religious service: Not on file    Active member of club or organization: Not on file    Attends meetings of clubs or organizations: Not on file    Relationship status: Not on file  . Intimate partner violence:    Fear of current or ex partner: Not on file    Emotionally abused: Not on file    Physically abused: Not on file    Forced sexual activity: Not on file  Other Topics Concern  . Not on file  Social History Narrative  . Not on file    I appreciate the opportunity to take part in Lisamarie's care. Please do not hesitate to contact me with questions.  Sincerely,   R. Edgar Frisk, MD

## 2018-04-26 NOTE — Assessment & Plan Note (Signed)
   Continue meticulous avoidance of mammalian meat, shellfish, and fish and have access to epinephrine autoinjector 2 pack in case of accidental ingestion.  Food allergy action plan is in place.

## 2018-04-26 NOTE — Assessment & Plan Note (Signed)
No change after controlling postnasal drainage and attempting to control possible silent reflux.  She will assess for symptom change while modifying her diet.  If this problem persists or progresses, further evaluation by otolaryngologist will be recommended.

## 2018-04-26 NOTE — Assessment & Plan Note (Signed)
Stable.  Continue appropriate allergen avoidance measures and immunotherapy injections as prescribed.  If needed, add carbinoxamine and/or azelastine nasal spray.  Nasal saline lavage (NeilMed) has been recommended as needed and prior to medicated nasal sprays along with instructions for proper administration.

## 2018-04-26 NOTE — Patient Instructions (Addendum)
Mild persistent asthma Well-controlled.  Continue Arnuity Ellipta 100 g, 1 inhalation daily, and albuterol HFA, 1-2 elations every 4-6 hours as needed..  During upper respiratory tract infections and asthma flares, resume Brio Ellipta 100-25 g, 1 inhalation daily, until symptoms have returned to baseline.  Subjective and objective measures of pulmonary function will be followed and the treatment plan will be adjusted accordingly.  Allergic rhinitis Stable.  Continue appropriate allergen avoidance measures and immunotherapy injections as prescribed.  If needed, add carbinoxamine and/or azelastine nasal spray.  Nasal saline lavage (NeilMed) has been recommended as needed and prior to medicated nasal sprays along with instructions for proper administration.  Food Allergy  Continue meticulous avoidance of mammalian meat, shellfish, and fish and have access to epinephrine autoinjector 2 pack in case of accidental ingestion.  Food allergy action plan is in place.  Globus sensation No change after controlling postnasal drainage and attempting to control possible silent reflux.  She will assess for symptom change while modifying her diet.  If this problem persists or progresses, further evaluation by otolaryngologist will be recommended.   Return in about 6 months (around 10/25/2018), or if symptoms worsen or fail to improve.

## 2018-05-02 ENCOUNTER — Ambulatory Visit (INDEPENDENT_AMBULATORY_CARE_PROVIDER_SITE_OTHER): Payer: BLUE CROSS/BLUE SHIELD | Admitting: *Deleted

## 2018-05-02 DIAGNOSIS — J3089 Other allergic rhinitis: Secondary | ICD-10-CM | POA: Diagnosis not present

## 2018-05-10 DIAGNOSIS — G5602 Carpal tunnel syndrome, left upper limb: Secondary | ICD-10-CM | POA: Diagnosis not present

## 2018-05-10 DIAGNOSIS — M1711 Unilateral primary osteoarthritis, right knee: Secondary | ICD-10-CM | POA: Diagnosis not present

## 2018-05-10 DIAGNOSIS — M5126 Other intervertebral disc displacement, lumbar region: Secondary | ICD-10-CM | POA: Diagnosis not present

## 2018-05-10 DIAGNOSIS — G5601 Carpal tunnel syndrome, right upper limb: Secondary | ICD-10-CM | POA: Diagnosis not present

## 2018-05-16 ENCOUNTER — Ambulatory Visit (INDEPENDENT_AMBULATORY_CARE_PROVIDER_SITE_OTHER): Payer: BLUE CROSS/BLUE SHIELD | Admitting: *Deleted

## 2018-05-16 DIAGNOSIS — J309 Allergic rhinitis, unspecified: Secondary | ICD-10-CM | POA: Diagnosis not present

## 2018-05-17 ENCOUNTER — Telehealth: Payer: Self-pay

## 2018-05-17 NOTE — Telephone Encounter (Signed)
Patient called.  Got ITX yesterday.  C/o large bright red area larger than her hand at injection site.  Took antihistamines and put cold compress on site without relief.  Would like to come by [facility] clinic today to let us check arm.  Patient will come by clinic before 4:30 pm today.

## 2018-05-17 NOTE — Telephone Encounter (Addendum)
Patient came by clinic.  Informed Dr. Verlin Fester.  Only Right arm with reaction.  Large swelling around entire upper right arm going under arm area.  Per Dr. Verlin Fester, take Benadryl 25-50 mg q 4-6 hours, ice to area, Aleve or another NSAID for inflammation and take cetirizine daily.  Will decrease dose accordingly per Dr. Verlin Fester.

## 2018-05-30 ENCOUNTER — Ambulatory Visit (INDEPENDENT_AMBULATORY_CARE_PROVIDER_SITE_OTHER): Payer: BLUE CROSS/BLUE SHIELD | Admitting: *Deleted

## 2018-05-30 DIAGNOSIS — L821 Other seborrheic keratosis: Secondary | ICD-10-CM | POA: Diagnosis not present

## 2018-05-30 DIAGNOSIS — J309 Allergic rhinitis, unspecified: Secondary | ICD-10-CM | POA: Diagnosis not present

## 2018-06-01 NOTE — Progress Notes (Signed)
VIALS EXP 06-06-19

## 2018-06-05 ENCOUNTER — Ambulatory Visit (INDEPENDENT_AMBULATORY_CARE_PROVIDER_SITE_OTHER): Payer: BLUE CROSS/BLUE SHIELD

## 2018-06-05 DIAGNOSIS — M1711 Unilateral primary osteoarthritis, right knee: Secondary | ICD-10-CM | POA: Diagnosis not present

## 2018-06-05 DIAGNOSIS — J309 Allergic rhinitis, unspecified: Secondary | ICD-10-CM | POA: Diagnosis not present

## 2018-06-05 DIAGNOSIS — M5126 Other intervertebral disc displacement, lumbar region: Secondary | ICD-10-CM | POA: Diagnosis not present

## 2018-06-05 DIAGNOSIS — G5602 Carpal tunnel syndrome, left upper limb: Secondary | ICD-10-CM | POA: Diagnosis not present

## 2018-06-05 DIAGNOSIS — G5601 Carpal tunnel syndrome, right upper limb: Secondary | ICD-10-CM | POA: Diagnosis not present

## 2018-06-12 ENCOUNTER — Ambulatory Visit (INDEPENDENT_AMBULATORY_CARE_PROVIDER_SITE_OTHER): Payer: BLUE CROSS/BLUE SHIELD | Admitting: *Deleted

## 2018-06-12 DIAGNOSIS — M170 Bilateral primary osteoarthritis of knee: Secondary | ICD-10-CM | POA: Diagnosis not present

## 2018-06-12 DIAGNOSIS — M25562 Pain in left knee: Secondary | ICD-10-CM | POA: Diagnosis not present

## 2018-06-12 DIAGNOSIS — J309 Allergic rhinitis, unspecified: Secondary | ICD-10-CM | POA: Diagnosis not present

## 2018-06-12 DIAGNOSIS — J3089 Other allergic rhinitis: Secondary | ICD-10-CM | POA: Diagnosis not present

## 2018-06-12 DIAGNOSIS — M25561 Pain in right knee: Secondary | ICD-10-CM | POA: Diagnosis not present

## 2018-06-20 ENCOUNTER — Ambulatory Visit (INDEPENDENT_AMBULATORY_CARE_PROVIDER_SITE_OTHER): Payer: BLUE CROSS/BLUE SHIELD

## 2018-06-20 DIAGNOSIS — J309 Allergic rhinitis, unspecified: Secondary | ICD-10-CM | POA: Diagnosis not present

## 2018-06-27 ENCOUNTER — Ambulatory Visit (INDEPENDENT_AMBULATORY_CARE_PROVIDER_SITE_OTHER): Payer: BLUE CROSS/BLUE SHIELD | Admitting: *Deleted

## 2018-06-27 DIAGNOSIS — J309 Allergic rhinitis, unspecified: Secondary | ICD-10-CM

## 2018-06-28 ENCOUNTER — Ambulatory Visit: Payer: BLUE CROSS/BLUE SHIELD | Admitting: Obstetrics and Gynecology

## 2018-06-28 ENCOUNTER — Telehealth: Payer: Self-pay | Admitting: Obstetrics and Gynecology

## 2018-06-28 ENCOUNTER — Other Ambulatory Visit: Payer: Self-pay

## 2018-06-28 ENCOUNTER — Encounter: Payer: Self-pay | Admitting: Obstetrics and Gynecology

## 2018-06-28 VITALS — BP 132/62 | HR 64 | Wt 278.2 lb

## 2018-06-28 DIAGNOSIS — N939 Abnormal uterine and vaginal bleeding, unspecified: Secondary | ICD-10-CM

## 2018-06-28 NOTE — Telephone Encounter (Signed)
I agree with office visit.  OK to close encounter.

## 2018-06-28 NOTE — Telephone Encounter (Signed)
Patient called stating " I am menopausal and have not had a period in 6 months until today, is this normal"?

## 2018-06-28 NOTE — Telephone Encounter (Signed)
Encounter closed

## 2018-06-28 NOTE — Telephone Encounter (Signed)
Left message to call Sharee Pimple, RN at [facility].    Last AEX 11/30/17 Per review of OV notes dated 12/26/17, last [place] 42.7 on 10/21/17 at outside lab

## 2018-06-28 NOTE — Telephone Encounter (Signed)
Spoke with patient. Patient reports she is menopausal, no menses for 6 months. Reports spotting last week with breast tenderness and "normal menses flow" started 1/14. Experiencing some mild cramping. IUD was removed 12/2017. Last [place] 42.7 10/2017 at outside facility. Denies N/V, fever/chills, heavy bleeding.   Recommended OV for further evaluation. OV scheduled for today at 2:30pm with Dr. Quincy Simmonds. Advised I will review with Dr. Quincy Simmonds and return call if any additional recommendations. Patient agreeable.   Routing to Dr. Quincy Simmonds to review.

## 2018-06-28 NOTE — Progress Notes (Signed)
GYNECOLOGY  VISIT   HPI: 53 y.o.   Married  Caucasian  female   [phone] with No LMP recorded (lmp unknown). Patient is postmenopausal.   here for abnormal uterine bleeding.    Had breast tenderness and then a week later bleeding started.  Pad change every 3 - 4 hours.  Light red with small clotting.  No cramping but feel heaviness.   [place] 29.1 on 11/25/16. [place] 42.7 on 10/21/17 at an outside lab.  She had her Mirena IUD removed on 12/26/17. Had no cycles with Mirena and liked it.   Needs to loose weight before having a knee replacement.   GYNECOLOGIC HISTORY: No LMP recorded (lmp unknown). Patient is postmenopausal. Contraception:  Post menopausal Menopausal hormone therapy:  None Last mammogram:  08/05/2017 [place] 1 negative Last pap smear:   11/07/2015 WNL        OB History    Gravida  4   Para  4   Term  4   Preterm      AB      Living  4     SAB      TAB      Ectopic      Multiple      Live Births  4              Patient Active Problem List   Diagnosis Date Noted  . Coughing 11/24/2017  . Globus sensation 11/24/2017  . Laryngopharyngeal reflux (LPR) 11/24/2017  . Allergic reaction 03/02/2017  . Contact dermatitis due to chemicals 09/22/2016  . Allergic rhinitis 09/01/2015  . Mild persistent asthma 09/01/2015  . Food Allergy 09/01/2015  . Cystocele 11/02/2013    Past Medical History:  Diagnosis Date  . Elevated alkaline phosphatase level 10/21/2017   138 - Dr. Geryl Rankins  . Elevated ALT measurement 10/21/2017   62 - Dr. Theador Hawthorne  . Elevated cholesterol 10/21/2017   Dr. Geryl Rankins  . Hashimoto's disease   . Hypertension    asthma related no medication taken  . Plantar fasciitis 2007  . Thigh DVT (deep venous thrombosis) ([place]) 1995    Past Surgical History:  Procedure Laterality Date  . HAND SURGERY Right 2006  . KNEE ARTHROSCOPY Left 04/2010  . LAPAROSCOPY  1991  . SHOULDER ARTHROSCOPY  2004    Current Outpatient Medications   Medication Sig Dispense Refill  . Acetylcysteine (NAC PO) Take 1 tablet by mouth daily.     . Albuterol Sulfate (PROVENTIL HFA IN) Inhale into the lungs.    . Ascorbic Acid (VITAMIN C) POWD Take by mouth. 5tsp prn    . fluocinonide ointment (LIDEX) 0.05 % APPLY AA ONLY ON LEGS UNTIL CLEAR  0  . losartan (COZAAR) 50 MG tablet TK 1 T PO  BID  5  . Magnesium (OPTIMAG 125 PO) Take by mouth.    . metFORMIN (GLUCOPHAGE) 1000 MG tablet Take 500 mg by mouth 2 (two) times daily.   3  . MILK THISTLE PO Take by mouth.    . NON FORMULARY Hemp Symmetry  - one dropper full po at bedtime    . potassium chloride (K-DUR,KLOR-CON) 10 MEQ tablet TK 1 T PO BID WF  3  . UNABLE TO FIND G.I Detox twice daily    . UNABLE TO FIND ALAmax CR    . UNABLE TO FIND 5HTP CR 118m 1 tab twice daily    . UNABLE TO FIND Chromate GTF Cromium polynicotinate 6064m2 capsules twice  daily    . UNABLE TO FIND Low dose naltrexone 4.61m 1 at bedtime    . Vitamin D, Ergocalciferol, (DRISDOL) 50000 units CAPS capsule Take 50,000 Units by mouth. Every 6 days    . aspirin 81 MG tablet Take 81 mg by mouth daily. TAKES 2 PILLS ONCE DAILY    . Azelastine HCl 0.15 % SOLN 1-2 sprays each nostril twice a day as needed. (Patient not taking: Reported on 06/28/2018) 30 mL 5  . BREO ELLIPTA 100-25 MCG/INH AEPB Inhale 1 puff into the lungs daily. (Patient not taking: Reported on 06/28/2018) 3 each 0  . Carbinoxamine Maleate 4 MG TABS One tablet every 6-8 hours if needed (Patient not taking: Reported on 06/28/2018) 60 each 5  . fluconazole (DIFLUCAN) 200 MG tablet   1  . Fluticasone Furoate (ARNUITY ELLIPTA) 100 MCG/ACT AEPB Inhale 1 puff into the lungs daily. 3 each 3  . omeprazole (PRILOSEC) 20 MG capsule Two capsules once a day, 30 minutes prior to breakfast. (Patient not taking: Reported on 06/28/2018) 60 capsule 5  . UNABLE TO FIND Interfase plus takes 2 capsules daily    . UNABLE TO FIND DopaPlus Tyrosine    . UNABLE TO FIND Nasal saline lavage      No current facility-administered medications for this visit.      ALLERGIES: Beef-derived products; Lambs quarters; Other; Pork-derived products; Bee venom; Latex; and Pollen extract  Family History  Problem Relation Age of Onset  . Arthritis Mother   . Hypertension Mother   . Diabetes Mother   . Allergic rhinitis Neg Hx   . Angioedema Neg Hx   . Eczema Neg Hx   . Asthma Neg Hx   . Immunodeficiency Neg Hx   . Urticaria Neg Hx     Social History   Socioeconomic History  . Marital status: Married    Spouse name: Not on file  . Number of children: Not on file  . Years of education: Not on file  . Highest education level: Not on file  Occupational History  . Not on file  Social Needs  . Financial resource strain: Not on file  . Food insecurity:    Worry: Not on file    Inability: Not on file  . Transportation needs:    Medical: Not on file    Non-medical: Not on file  Tobacco Use  . Smoking status: Never Smoker  . Smokeless tobacco: Never Used  Substance and Sexual Activity  . Alcohol use: Yes    Alcohol/week: 0.0 - 1.0 standard drinks  . Drug use: No  . Sexual activity: Yes    Partners: Male  Lifestyle  . Physical activity:    Days per week: Not on file    Minutes per session: Not on file  . Stress: Not on file  Relationships  . Social connections:    Talks on phone: Not on file    Gets together: Not on file    Attends religious service: Not on file    Active member of club or organization: Not on file    Attends meetings of clubs or organizations: Not on file    Relationship status: Not on file  . Intimate partner violence:    Fear of current or ex partner: Not on file    Emotionally abused: Not on file    Physically abused: Not on file    Forced sexual activity: Not on file  Other Topics Concern  . Not on file  Social History Narrative  . Not on file    Review of Systems  Constitutional: Negative.   HENT: Negative.   Eyes: Negative.    Respiratory: Negative.   Cardiovascular: Negative.   Gastrointestinal: Negative.   Endocrine: Negative.   Genitourinary: Positive for menstrual problem and vaginal bleeding.  Musculoskeletal: Negative.   Skin: Negative.   Allergic/Immunologic: Negative.   Neurological: Negative.   Hematological: Negative.   Psychiatric/Behavioral: Negative.     PHYSICAL EXAMINATION:    BP 132/62 (BP Location: Right Arm, Patient Position: Sitting, Cuff Size: Large)   Pulse 64   Wt 278 lb 3.2 oz (126.2 kg)   LMP  (LMP Unknown)   BMI 43.25 kg/m     General appearance: alert, cooperative and appears stated age   Pelvic: External genitalia:  no lesions.  Blood stained perineum.               Urethra:  normal appearing urethra with no masses, tenderness or lesions              Bartholins and Skenes: normal                 Vagina: normal appearing vagina with normal color and discharge, no lesions              Cervix: no lesions.  Bleeding from os.                  Bimanual Exam:  Uterus:  normal size, contour, position, consistency, mobility, non-tender              Adnexa: no mass, fullness, tenderness             Chaperone was present for exam.  ASSESSMENT  Vaginal bleeding.  Looks like menstruation.  IUD removal 12/2017 after having 2 elevated FSH results and an expired IUD. Hx DVT.   PLAN  FSH and estradiol. If menopausal, return for pelvic US and possible EMB. No pap today due to bleeding.    An After Visit Summary was printed and given to the patient.  __15___ minutes face to face time of which over 50% was spent in counseling.

## 2018-06-29 LAB — ESTRADIOL: Estradiol: 18.5 pg/mL

## 2018-06-29 LAB — FOLLICLE STIMULATING HORMONE: FSH: 24 m[IU]/mL

## 2018-07-03 ENCOUNTER — Telehealth: Payer: Self-pay | Admitting: *Deleted

## 2018-07-03 NOTE — Telephone Encounter (Signed)
Notes recorded by Burnice Logan, RN on 07/03/2018 at 12:16 PM EST Left message to call Sharee Pimple, RN at [facility].

## 2018-07-03 NOTE — Telephone Encounter (Signed)
This confirms that she is perimenopausal.  Her [place] actually dropped back to where it was a year ago.  Her levels can shift a lot during this period of time.

## 2018-07-03 NOTE — Telephone Encounter (Signed)
Spoke with patient. Advised of message as seen below from [place]. patient verbalizes understanding. Patient states that she had her [facility] tested in May with another doctor and it was 42.7. Asking if this makes any difference? Advised will review with Dr.Silva and return call.

## 2018-07-03 NOTE — Telephone Encounter (Signed)
-----   Message from Nunzio Cobbs, MD sent at 07/02/2018 11:13 AM EST ----- Please contact patient regarding her lab results.  Her [place] and estradiol are quite similar to what they were in this lab about one year ago.  This looks like perimenopause.  I would suggest she monitor for future cycles, as she may not have another one.  If she has another menstruation, please have her contact me back.

## 2018-07-04 ENCOUNTER — Ambulatory Visit (INDEPENDENT_AMBULATORY_CARE_PROVIDER_SITE_OTHER): Payer: BLUE CROSS/BLUE SHIELD | Admitting: *Deleted

## 2018-07-04 DIAGNOSIS — J309 Allergic rhinitis, unspecified: Secondary | ICD-10-CM | POA: Diagnosis not present

## 2018-07-04 NOTE — Telephone Encounter (Signed)
Patient returning call.

## 2018-07-04 NOTE — Telephone Encounter (Signed)
Left message to call Deloros Beretta, RN at [facility] [phone].   

## 2018-07-04 NOTE — Telephone Encounter (Signed)
Spoke with patient, advised as seen below per Dr. Quincy Simmonds. Patient verbalizes understanding and is agreeable. Encounter closed.

## 2018-07-11 ENCOUNTER — Ambulatory Visit (INDEPENDENT_AMBULATORY_CARE_PROVIDER_SITE_OTHER): Payer: BLUE CROSS/BLUE SHIELD | Admitting: *Deleted

## 2018-07-11 DIAGNOSIS — J4520 Mild intermittent asthma, uncomplicated: Secondary | ICD-10-CM | POA: Diagnosis not present

## 2018-07-11 DIAGNOSIS — Z0000 Encounter for general adult medical examination without abnormal findings: Secondary | ICD-10-CM | POA: Diagnosis not present

## 2018-07-11 DIAGNOSIS — E7800 Pure hypercholesterolemia, unspecified: Secondary | ICD-10-CM | POA: Diagnosis not present

## 2018-07-11 DIAGNOSIS — J309 Allergic rhinitis, unspecified: Secondary | ICD-10-CM | POA: Diagnosis not present

## 2018-07-11 DIAGNOSIS — I10 Essential (primary) hypertension: Secondary | ICD-10-CM | POA: Diagnosis not present

## 2018-07-12 DIAGNOSIS — M5126 Other intervertebral disc displacement, lumbar region: Secondary | ICD-10-CM | POA: Diagnosis not present

## 2018-07-12 DIAGNOSIS — G5602 Carpal tunnel syndrome, left upper limb: Secondary | ICD-10-CM | POA: Diagnosis not present

## 2018-07-12 DIAGNOSIS — G5601 Carpal tunnel syndrome, right upper limb: Secondary | ICD-10-CM | POA: Diagnosis not present

## 2018-07-12 DIAGNOSIS — M1711 Unilateral primary osteoarthritis, right knee: Secondary | ICD-10-CM | POA: Diagnosis not present

## 2018-07-18 ENCOUNTER — Ambulatory Visit (INDEPENDENT_AMBULATORY_CARE_PROVIDER_SITE_OTHER): Payer: BLUE CROSS/BLUE SHIELD

## 2018-07-18 DIAGNOSIS — J309 Allergic rhinitis, unspecified: Secondary | ICD-10-CM

## 2018-07-25 ENCOUNTER — Ambulatory Visit (INDEPENDENT_AMBULATORY_CARE_PROVIDER_SITE_OTHER): Payer: BLUE CROSS/BLUE SHIELD

## 2018-07-25 DIAGNOSIS — J309 Allergic rhinitis, unspecified: Secondary | ICD-10-CM | POA: Diagnosis not present

## 2018-08-02 DIAGNOSIS — E785 Hyperlipidemia, unspecified: Secondary | ICD-10-CM | POA: Diagnosis not present

## 2018-08-02 DIAGNOSIS — R635 Abnormal weight gain: Secondary | ICD-10-CM | POA: Diagnosis not present

## 2018-08-02 DIAGNOSIS — E1165 Type 2 diabetes mellitus with hyperglycemia: Secondary | ICD-10-CM | POA: Diagnosis not present

## 2018-08-02 DIAGNOSIS — F419 Anxiety disorder, unspecified: Secondary | ICD-10-CM | POA: Diagnosis not present

## 2018-08-02 DIAGNOSIS — B3782 Candidal enteritis: Secondary | ICD-10-CM | POA: Diagnosis not present

## 2018-08-08 DIAGNOSIS — J069 Acute upper respiratory infection, unspecified: Secondary | ICD-10-CM | POA: Diagnosis not present

## 2018-08-11 DIAGNOSIS — Z1231 Encounter for screening mammogram for malignant neoplasm of breast: Secondary | ICD-10-CM | POA: Diagnosis not present

## 2018-08-15 ENCOUNTER — Ambulatory Visit (INDEPENDENT_AMBULATORY_CARE_PROVIDER_SITE_OTHER): Payer: BLUE CROSS/BLUE SHIELD | Admitting: *Deleted

## 2018-08-15 DIAGNOSIS — J309 Allergic rhinitis, unspecified: Secondary | ICD-10-CM | POA: Diagnosis not present

## 2018-08-22 ENCOUNTER — Ambulatory Visit (INDEPENDENT_AMBULATORY_CARE_PROVIDER_SITE_OTHER): Payer: BLUE CROSS/BLUE SHIELD | Admitting: *Deleted

## 2018-08-22 DIAGNOSIS — J309 Allergic rhinitis, unspecified: Secondary | ICD-10-CM

## 2018-08-29 ENCOUNTER — Ambulatory Visit (INDEPENDENT_AMBULATORY_CARE_PROVIDER_SITE_OTHER): Payer: BLUE CROSS/BLUE SHIELD | Admitting: *Deleted

## 2018-08-29 DIAGNOSIS — J309 Allergic rhinitis, unspecified: Secondary | ICD-10-CM | POA: Diagnosis not present

## 2018-09-12 ENCOUNTER — Ambulatory Visit (INDEPENDENT_AMBULATORY_CARE_PROVIDER_SITE_OTHER): Payer: BLUE CROSS/BLUE SHIELD | Admitting: *Deleted

## 2018-09-12 DIAGNOSIS — J309 Allergic rhinitis, unspecified: Secondary | ICD-10-CM

## 2018-09-26 ENCOUNTER — Ambulatory Visit (INDEPENDENT_AMBULATORY_CARE_PROVIDER_SITE_OTHER): Payer: BLUE CROSS/BLUE SHIELD | Admitting: *Deleted

## 2018-09-26 DIAGNOSIS — J309 Allergic rhinitis, unspecified: Secondary | ICD-10-CM

## 2018-10-12 ENCOUNTER — Ambulatory Visit (INDEPENDENT_AMBULATORY_CARE_PROVIDER_SITE_OTHER): Payer: BLUE CROSS/BLUE SHIELD

## 2018-10-12 DIAGNOSIS — J309 Allergic rhinitis, unspecified: Secondary | ICD-10-CM

## 2018-10-17 DIAGNOSIS — W57XXXA Bitten or stung by nonvenomous insect and other nonvenomous arthropods, initial encounter: Secondary | ICD-10-CM | POA: Diagnosis not present

## 2018-10-17 DIAGNOSIS — E785 Hyperlipidemia, unspecified: Secondary | ICD-10-CM | POA: Diagnosis not present

## 2018-10-17 DIAGNOSIS — L2084 Intrinsic (allergic) eczema: Secondary | ICD-10-CM | POA: Diagnosis not present

## 2018-10-17 DIAGNOSIS — E1165 Type 2 diabetes mellitus with hyperglycemia: Secondary | ICD-10-CM | POA: Diagnosis not present

## 2018-10-17 DIAGNOSIS — Z8672 Personal history of thrombophlebitis: Secondary | ICD-10-CM | POA: Diagnosis not present

## 2018-10-17 DIAGNOSIS — Z7689 Persons encountering health services in other specified circumstances: Secondary | ICD-10-CM | POA: Diagnosis not present

## 2018-10-24 ENCOUNTER — Ambulatory Visit (INDEPENDENT_AMBULATORY_CARE_PROVIDER_SITE_OTHER): Payer: BLUE CROSS/BLUE SHIELD

## 2018-10-24 DIAGNOSIS — J309 Allergic rhinitis, unspecified: Secondary | ICD-10-CM | POA: Diagnosis not present

## 2018-10-25 ENCOUNTER — Encounter: Payer: Self-pay | Admitting: Allergy and Immunology

## 2018-10-25 ENCOUNTER — Other Ambulatory Visit: Payer: Self-pay

## 2018-10-25 ENCOUNTER — Ambulatory Visit: Payer: BLUE CROSS/BLUE SHIELD | Admitting: Allergy and Immunology

## 2018-10-25 VITALS — BP 120/80 | HR 80 | Temp 97.9°F | Resp 20 | Ht 67.0 in | Wt 270.3 lb

## 2018-10-25 DIAGNOSIS — J4530 Mild persistent asthma, uncomplicated: Secondary | ICD-10-CM

## 2018-10-25 DIAGNOSIS — J3089 Other allergic rhinitis: Secondary | ICD-10-CM | POA: Diagnosis not present

## 2018-10-25 DIAGNOSIS — T7800XD Anaphylactic reaction due to unspecified food, subsequent encounter: Secondary | ICD-10-CM | POA: Diagnosis not present

## 2018-10-25 NOTE — Patient Instructions (Signed)
Mild persistent asthma Well-controlled.  Continue Arnuity Ellipta 100 g, 1 inhalation daily, and albuterol HFA, 1-2 inhalations every 4-6 hours if needed.  During upper respiratory tract infections and asthma flares, use Brio Ellipta 100-25 g, 1 inhalation daily, until symptoms have returned to baseline.  Subjective and objective measures of pulmonary function will be followed and the treatment plan will be adjusted accordingly.  Allergic rhinitis Stable.  Continue appropriate allergen avoidance measures and immunotherapy injections as prescribed.  If needed, add carbinoxamine and/or azelastine nasal spray.  Nasal saline lavage (NeilMed) has been recommended as needed and prior to medicated nasal sprays along with instructions for proper administration.  Food Allergy  Continue careful avoidance of mammalian meat, shellfish, and fish and have access to epinephrine autoinjector 2 pack in case of accidental ingestion.  Food allergy action plan is in place.   Return in about 6 months (around 04/27/2019), or if symptoms worsen or fail to improve.

## 2018-10-25 NOTE — Progress Notes (Signed)
VIALS EXP 10-25-2019

## 2018-10-25 NOTE — Assessment & Plan Note (Signed)
Well-controlled.  Continue Arnuity Ellipta 100 g, 1 inhalation daily, and albuterol HFA, 1-2 inhalations every 4-6 hours if needed.  During upper respiratory tract infections and asthma flares, use Brio Ellipta 100-25 g, 1 inhalation daily, until symptoms have returned to baseline.  Subjective and objective measures of pulmonary function will be followed and the treatment plan will be adjusted accordingly.

## 2018-10-25 NOTE — Assessment & Plan Note (Signed)
Stable.  Continue appropriate allergen avoidance measures and immunotherapy injections as prescribed.  If needed, add carbinoxamine and/or azelastine nasal spray.  Nasal saline lavage (NeilMed) has been recommended as needed and prior to medicated nasal sprays along with instructions for proper administration.

## 2018-10-25 NOTE — Assessment & Plan Note (Addendum)
   Continue careful avoidance of mammalian meat, shellfish, and fish and have access to epinephrine autoinjector 2 pack in case of accidental ingestion.  Food allergy action plan is in place.

## 2018-10-25 NOTE — Progress Notes (Signed)
Follow-up Note  RE: Monica Good MRN: [id number] DOB: 1965-07-15 Date of Office Visit: 10/25/2018  Primary care provider: Carol Ada, MD Referring provider: Carol Ada, MD  History of present illness: Monica Good is a 53 y.o. female with persistent asthma, allergic rhinitis, and food allergy presenting today for follow-up.  She was last seen in this clinic in November 2019.  She reports that her asthma has been well controlled with Arnuity Ellipta 100 g, 1 inhalation daily.  She rarely requires albuterol rescue and does not experience limitations in normal daily activities or nocturnal awakenings due to lower respiratory symptoms.  Other than rare sneezing, her nasal allergies have been well controlled with as needed allergy medications and aero allergen immunotherapy injections.  In the interval since her previous visit she has avoided mammalian meat, fish, and shellfish without accidental ingestion leading to systemic symptoms.  She has access to an epinephrine autoinjector.  Assessment and plan: Mild persistent asthma Well-controlled.  Continue Arnuity Ellipta 100 g, 1 inhalation daily, and albuterol HFA, 1-2 inhalations every 4-6 hours if needed.  During upper respiratory tract infections and asthma flares, use Brio Ellipta 100-25 g, 1 inhalation daily, until symptoms have returned to baseline.  Subjective and objective measures of pulmonary function will be followed and the treatment plan will be adjusted accordingly.  Allergic rhinitis Stable.  Continue appropriate allergen avoidance measures and immunotherapy injections as prescribed.  If needed, add carbinoxamine and/or azelastine nasal spray.  Nasal saline lavage (NeilMed) has been recommended as needed and prior to medicated nasal sprays along with instructions for proper administration.  Food Allergy  Continue careful avoidance of mammalian meat, shellfish, and fish and have access to epinephrine  autoinjector 2 pack in case of accidental ingestion.  Food allergy action plan is in place.   Diagnostics: Spirometry:  Normal with an FEV1 of 89% predicted.  Please see scanned spirometry results for details.    Physical examination: Blood pressure 120/80, pulse 80, temperature 97.9 F (36.6 C), temperature source Oral, resp. rate 20, height 5' 7"  (1.702 m), weight 270 lb 4.5 oz (122.6 kg).  General: Alert, interactive, in no acute distress. HEENT: TMs pearly gray, turbinates mildly edematous without discharge, post-pharynx unremarkable. Neck: Supple without lymphadenopathy. Lungs: Clear to auscultation without wheezing, rhonchi or rales. CV: Normal S1, S2 without murmurs. Skin: Warm and dry, without lesions or rashes.  The following portions of the patient's history were reviewed and updated as appropriate: allergies, current medications, past family history, past medical history, past social history, past surgical history and problem list.  Allergies as of 10/25/2018      Reactions   Beef-derived Products Anaphylaxis   Lambs Quarters Anaphylaxis   Has not had but is warned by allergist that could have anaphylaxis.   Other Anaphylaxis   **SEAFOOD**   Pork-derived Products Anaphylaxis   Bee Venom Swelling   Latex    Pollen Extract       Medication List       Accurate as of Oct 25, 2018  5:30 PM. If you have any questions, ask your nurse or doctor.        STOP taking these medications   Breo Ellipta 100-25 MCG/INH Aepb Generic drug:  fluticasone furoate-vilanterol Stopped by:  Edmonia Lynch, MD   fluconazole 200 MG tablet Commonly known as:  DIFLUCAN Stopped by:  Edmonia Lynch, MD   metFORMIN 1000 MG tablet Commonly known as:  GLUCOPHAGE Stopped by:  Edmonia Lynch, MD  UNABLE TO FIND Stopped by:  Edmonia Lynch, MD     TAKE these medications   aspirin 81 MG tablet Take 81 mg by mouth daily. TAKES 2 PILLS ONCE DAILY   Azelastine HCl 0.15 %  Soln 1-2 sprays each nostril twice a day as needed.   buPROPion 150 MG 24 hr tablet Commonly known as:  WELLBUTRIN XL TK 1 T PO QD   Carbinoxamine Maleate 4 MG Tabs One tablet every 6-8 hours if needed   fluocinonide ointment 0.05 % Commonly known as:  LIDEX APPLY AA ONLY ON LEGS UNTIL CLEAR   Fluticasone Furoate 100 MCG/ACT Aepb Commonly known as:  Arnuity Ellipta Inhale 1 puff into the lungs daily.   losartan 50 MG tablet Commonly known as:  COZAAR TK 1 T PO  BID   MILK THISTLE PO Take by mouth.   NAC PO Take 1 tablet by mouth daily.   NON FORMULARY Hemp Symmetry  - one dropper full po at bedtime   omeprazole 20 MG capsule Commonly known as:  PRILOSEC Two capsules once a day, 30 minutes prior to breakfast.   OPTIMAG 125 PO Take by mouth.   potassium chloride 10 MEQ tablet Commonly known as:  K-DUR TK 1 T PO BID WF   PROVENTIL HFA IN Inhale into the lungs.   UNABLE TO FIND G.I Detox twice daily   UNABLE TO FIND ALAmax CR   UNABLE TO FIND DopaPlus Tyrosine   UNABLE TO FIND 5HTP CR 127m 1 tab twice daily   UNABLE TO FIND Chromate GTF Cromium polynicotinate 6059m2 capsules twice daily   UNABLE TO FIND Low dose naltrexone 4.24m43m at bedtime   UNABLE TO FIND Nasal saline lavage   Vitamin C Powd Take by mouth. 5tsp prn   Vitamin D (Ergocalciferol) 1.25 MG (50000 UT) Caps capsule Commonly known as:  DRISDOL Take 50,000 Units by mouth. Every 6 days       Allergies  Allergen Reactions   Beef-Derived Products Anaphylaxis   Lambs Quarters Anaphylaxis    Has not had but is warned by allergist that could have anaphylaxis.   Other Anaphylaxis    **SEAFOOD**   Pork-Derived Products Anaphylaxis   Bee Venom Swelling   Latex    Pollen Extract     I appreciate the opportunity to take part in Monica Good's care. Please do not hesitate to contact me with questions.  Sincerely,   R. CarEdgar FriskD

## 2018-10-26 DIAGNOSIS — J3089 Other allergic rhinitis: Secondary | ICD-10-CM | POA: Diagnosis not present

## 2018-11-08 ENCOUNTER — Ambulatory Visit (INDEPENDENT_AMBULATORY_CARE_PROVIDER_SITE_OTHER): Payer: BLUE CROSS/BLUE SHIELD | Admitting: *Deleted

## 2018-11-08 DIAGNOSIS — J309 Allergic rhinitis, unspecified: Secondary | ICD-10-CM

## 2018-11-21 ENCOUNTER — Ambulatory Visit (INDEPENDENT_AMBULATORY_CARE_PROVIDER_SITE_OTHER): Payer: BLUE CROSS/BLUE SHIELD | Admitting: *Deleted

## 2018-11-21 DIAGNOSIS — J309 Allergic rhinitis, unspecified: Secondary | ICD-10-CM | POA: Diagnosis not present

## 2018-11-28 ENCOUNTER — Ambulatory Visit (INDEPENDENT_AMBULATORY_CARE_PROVIDER_SITE_OTHER): Payer: BLUE CROSS/BLUE SHIELD | Admitting: *Deleted

## 2018-11-28 DIAGNOSIS — J309 Allergic rhinitis, unspecified: Secondary | ICD-10-CM

## 2018-12-05 ENCOUNTER — Ambulatory Visit (INDEPENDENT_AMBULATORY_CARE_PROVIDER_SITE_OTHER): Payer: BLUE CROSS/BLUE SHIELD | Admitting: *Deleted

## 2018-12-05 DIAGNOSIS — J309 Allergic rhinitis, unspecified: Secondary | ICD-10-CM

## 2018-12-07 DIAGNOSIS — Z86010 Personal history of colonic polyps: Secondary | ICD-10-CM | POA: Diagnosis not present

## 2018-12-07 DIAGNOSIS — D123 Benign neoplasm of transverse colon: Secondary | ICD-10-CM | POA: Diagnosis not present

## 2018-12-11 ENCOUNTER — Other Ambulatory Visit: Payer: Self-pay

## 2018-12-11 ENCOUNTER — Ambulatory Visit (INDEPENDENT_AMBULATORY_CARE_PROVIDER_SITE_OTHER): Payer: BC Managed Care – PPO | Admitting: Obstetrics and Gynecology

## 2018-12-11 ENCOUNTER — Other Ambulatory Visit (HOSPITAL_COMMUNITY)
Admission: RE | Admit: 2018-12-11 | Discharge: 2018-12-11 | Disposition: A | Discharge status: Home / Self Care | Payer: BC Managed Care – PPO | Source: Ambulatory Visit | Attending: Obstetrics and Gynecology | Admitting: Obstetrics and Gynecology

## 2018-12-11 ENCOUNTER — Encounter: Payer: Self-pay | Admitting: Obstetrics and Gynecology

## 2018-12-11 VITALS — BP 118/68 | HR 84 | Temp 97.5°F | Resp 14 | Ht 67.0 in | Wt 269.0 lb

## 2018-12-11 DIAGNOSIS — Z01419 Encounter for gynecological examination (general) (routine) without abnormal findings: Secondary | ICD-10-CM

## 2018-12-11 DIAGNOSIS — Z113 Encounter for screening for infections with a predominantly sexual mode of transmission: Secondary | ICD-10-CM | POA: Diagnosis not present

## 2018-12-11 NOTE — Progress Notes (Signed)
53 y.o. G68P4004 Married [place] female here for annual exam.    Had bleeding in January, 2020. FSH 24 and E2 18.5 on 06/28/18.  Had a little spotting in April, 2020 for a couple of days.   Taking a lot of supplements and feeling well.   Working from home for [employer].  Family in [place].   PCP:   Carol Ada, MD  No LMP recorded (lmp unknown). Patient is postmenopausal.           Sexually active: Yes.    The current method of family planning is vasectomy.    Exercising: No.  The patient does not participate in regular exercise at present. Smoker:  no  Health Maintenance: Pap:  11-07-15 neg HPV HR neg History of abnormal Pap:  no MMG:  08/11/18 BIRADS 1 negative/density b Colonoscopy:  12/07/18 polyps removed TDaP:  2018 HIV: done years ago with pregnancy Hep C: unsure Screening Labs: PCP   reports that she has never smoked. She has never used smokeless tobacco. She reports current alcohol use. She reports that she does not use drugs.  Past Medical History:  Diagnosis Date  . Elevated alkaline phosphatase level 10/21/2017   138 - Dr. Geryl Rankins  . Elevated ALT measurement 10/21/2017   62 - Dr. Theador Hawthorne  . Elevated cholesterol 10/21/2017   Dr. Geryl Rankins  . Hashimoto's disease   . Hypertension    asthma related no medication taken  . Plantar fasciitis 2007  . Thigh DVT (deep venous thrombosis) ([place]) 1995    Past Surgical History:  Procedure Laterality Date  . HAND SURGERY Right 2006  . KNEE ARTHROSCOPY Left 04/2010  . LAPAROSCOPY  1991  . SHOULDER ARTHROSCOPY  2004    Current Outpatient Medications  Medication Sig Dispense Refill  . Acetylcysteine (NAC PO) Take 1 tablet by mouth daily.     . Albuterol Sulfate (PROVENTIL HFA IN) Inhale into the lungs.    . Ascorbic Acid (VITAMIN C) POWD Take by mouth. 5tsp prn    . buPROPion (WELLBUTRIN XL) 150 MG 24 hr tablet TK 1 T PO QD    . fluocinonide ointment (LIDEX) 0.05 % APPLY AA ONLY ON LEGS UNTIL CLEAR  0  . losartan  (COZAAR) 50 MG tablet TK 1 T PO  BID  5  . Magnesium (OPTIMAG 125 PO) Take by mouth.    Marland Kitchen MILK THISTLE PO Take by mouth.    . NON FORMULARY Hemp Symmetry  - one dropper full po at bedtime    . potassium chloride (K-DUR) 10 MEQ tablet TK 1 T PO ONCE D    . UNABLE TO FIND ALAmax CR    . UNABLE TO FIND 5HTP CR 171m 1 tab twice daily    . UNABLE TO FIND Chromate GTF Cromium polynicotinate 6063m2 capsules twice daily    . UNABLE TO FIND Low dose naltrexone 4.74m71m at bedtime    . Vitamin A 2250 MCG (7500 UT) CAPS Take by mouth.    . Vitamin D, Ergocalciferol, (DRISDOL) 50000 units CAPS capsule Take 50,000 Units by mouth. Every 6 days     No current facility-administered medications for this visit.     Family History  Problem Relation Age of Onset  . Arthritis Mother   . Hypertension Mother   . Diabetes Mother   . Allergic rhinitis Neg Hx   . Angioedema Neg Hx   . Eczema Neg Hx   . Asthma Neg Hx   .  Immunodeficiency Neg Hx   . Urticaria Neg Hx     Review of Systems  Constitutional: Negative.   HENT: Negative.   Eyes: Negative.   Respiratory: Negative.   Cardiovascular: Negative.   Gastrointestinal: Negative.   Endocrine: Negative.   Genitourinary: Negative.   Musculoskeletal: Negative.   Skin: Negative.   Allergic/Immunologic: Negative.   Neurological: Negative.   Hematological: Negative.   Psychiatric/Behavioral: Negative.     Exam:   BP 118/68 (BP Location: Right Arm, Patient Position: Sitting, Cuff Size: Large)   Pulse 84   Temp (!) 97.5 F (36.4 C) (Temporal)   Resp 14   Ht 5' 7"  (1.702 m)   Wt 269 lb (122 kg)   LMP  (LMP Unknown)   BMI 42.13 kg/m     General appearance: alert, cooperative and appears stated age Head: normocephalic, without obvious abnormality, atraumatic Neck: no adenopathy, supple, symmetrical, trachea midline and thyroid normal to inspection and palpation Lungs: clear to auscultation bilaterally Breasts: normal appearance, no masses or  tenderness, No nipple retraction or dimpling, No nipple discharge or bleeding, No axillary adenopathy Heart: regular rate and rhythm Abdomen: soft, non-tender; no masses, no organomegaly Extremities: extremities normal, atraumatic, no cyanosis or edema Skin: skin color, texture, turgor normal. No rashes or lesions Lymph nodes: cervical, supraclavicular, and axillary nodes normal. Neurologic: grossly normal  Pelvic: External genitalia:  no lesions              No abnormal inguinal nodes palpated.              Urethra:  normal appearing urethra with no masses, tenderness or lesions              Bartholins and Skenes: normal                 Vagina: normal appearing vagina with normal color and discharge, no lesions              Cervix: no lesions              Pap taken: Yes.   Bimanual Exam:  Uterus:  normal size, contour, position, consistency, mobility, non-tender              Adnexa: no mass, fullness, tenderness              Rectal exam: Yes.  .  Confirms.              Anus:  normal sphincter tone, no lesions  Chaperone was present for exam.  Assessment:   Well woman visit with normal exam. Perimenopausal female. Hx DVT.  Hx low vit D.  On high dose vit D.  STD screening for hep C aby.   Plan: Mammogram screening discussed. Self breast awareness reviewed. Pap and HR HPV as above. Guidelines for Calcium, Vitamin D, regular exercise program including cardiovascular and weight bearing exercise. Hep C aby.  Follow up annually and prn.   After visit summary provided.

## 2018-12-11 NOTE — Patient Instructions (Signed)

## 2018-12-12 ENCOUNTER — Ambulatory Visit (INDEPENDENT_AMBULATORY_CARE_PROVIDER_SITE_OTHER): Payer: BC Managed Care – PPO | Admitting: *Deleted

## 2018-12-12 DIAGNOSIS — J309 Allergic rhinitis, unspecified: Secondary | ICD-10-CM

## 2018-12-12 LAB — HEPATITIS C ANTIBODY: Hep C Virus Ab: <0.1 s/co ratio (ref 0.0–0.9)

## 2018-12-13 LAB — CYTOLOGY - PAP
Diagnosis: NEGATIVE
HPV: NOT DETECTED

## 2018-12-19 ENCOUNTER — Ambulatory Visit (INDEPENDENT_AMBULATORY_CARE_PROVIDER_SITE_OTHER): Payer: BC Managed Care – PPO | Admitting: *Deleted

## 2018-12-19 DIAGNOSIS — J309 Allergic rhinitis, unspecified: Secondary | ICD-10-CM | POA: Diagnosis not present

## 2018-12-25 ENCOUNTER — Other Ambulatory Visit: Payer: Self-pay | Admitting: *Deleted

## 2018-12-25 MED ORDER — ARNUITY ELLIPTA 100 MCG/ACT IN AEPB: 1.0000 | INHALATION_SPRAY | Freq: Every day | RESPIRATORY_TRACT | 4 refills | Status: DC

## 2018-12-26 ENCOUNTER — Ambulatory Visit (INDEPENDENT_AMBULATORY_CARE_PROVIDER_SITE_OTHER): Payer: BC Managed Care – PPO | Admitting: *Deleted

## 2018-12-26 DIAGNOSIS — J309 Allergic rhinitis, unspecified: Secondary | ICD-10-CM

## 2019-01-15 DIAGNOSIS — I10 Essential (primary) hypertension: Secondary | ICD-10-CM | POA: Diagnosis not present

## 2019-01-15 DIAGNOSIS — E7800 Pure hypercholesterolemia, unspecified: Secondary | ICD-10-CM | POA: Diagnosis not present

## 2019-01-15 DIAGNOSIS — F419 Anxiety disorder, unspecified: Secondary | ICD-10-CM | POA: Diagnosis not present

## 2019-01-15 DIAGNOSIS — J4520 Mild intermittent asthma, uncomplicated: Secondary | ICD-10-CM | POA: Diagnosis not present

## 2019-01-16 ENCOUNTER — Ambulatory Visit (INDEPENDENT_AMBULATORY_CARE_PROVIDER_SITE_OTHER): Payer: BC Managed Care – PPO | Admitting: *Deleted

## 2019-01-16 DIAGNOSIS — J309 Allergic rhinitis, unspecified: Secondary | ICD-10-CM | POA: Diagnosis not present

## 2019-01-31 DIAGNOSIS — E1165 Type 2 diabetes mellitus with hyperglycemia: Secondary | ICD-10-CM | POA: Diagnosis not present

## 2019-01-31 DIAGNOSIS — E785 Hyperlipidemia, unspecified: Secondary | ICD-10-CM | POA: Diagnosis not present

## 2019-01-31 DIAGNOSIS — E063 Autoimmune thyroiditis: Secondary | ICD-10-CM | POA: Diagnosis not present

## 2019-01-31 DIAGNOSIS — I10 Essential (primary) hypertension: Secondary | ICD-10-CM | POA: Diagnosis not present

## 2019-01-31 DIAGNOSIS — R799 Abnormal finding of blood chemistry, unspecified: Secondary | ICD-10-CM | POA: Diagnosis not present

## 2019-01-31 DIAGNOSIS — E559 Vitamin D deficiency, unspecified: Secondary | ICD-10-CM | POA: Diagnosis not present

## 2019-02-06 ENCOUNTER — Ambulatory Visit (INDEPENDENT_AMBULATORY_CARE_PROVIDER_SITE_OTHER): Payer: BC Managed Care – PPO | Admitting: *Deleted

## 2019-02-06 DIAGNOSIS — J309 Allergic rhinitis, unspecified: Secondary | ICD-10-CM

## 2019-02-08 DIAGNOSIS — G5601 Carpal tunnel syndrome, right upper limb: Secondary | ICD-10-CM | POA: Diagnosis not present

## 2019-02-08 DIAGNOSIS — G5602 Carpal tunnel syndrome, left upper limb: Secondary | ICD-10-CM | POA: Diagnosis not present

## 2019-02-08 DIAGNOSIS — M5126 Other intervertebral disc displacement, lumbar region: Secondary | ICD-10-CM | POA: Diagnosis not present

## 2019-02-08 DIAGNOSIS — M9903 Segmental and somatic dysfunction of lumbar region: Secondary | ICD-10-CM | POA: Diagnosis not present

## 2019-02-23 DIAGNOSIS — E7800 Pure hypercholesterolemia, unspecified: Secondary | ICD-10-CM | POA: Diagnosis not present

## 2019-02-23 DIAGNOSIS — Z6841 Body Mass Index (BMI) 40.0 and over, adult: Secondary | ICD-10-CM | POA: Diagnosis not present

## 2019-02-23 DIAGNOSIS — I10 Essential (primary) hypertension: Secondary | ICD-10-CM | POA: Diagnosis not present

## 2019-02-27 ENCOUNTER — Ambulatory Visit (INDEPENDENT_AMBULATORY_CARE_PROVIDER_SITE_OTHER): Payer: BC Managed Care – PPO | Admitting: *Deleted

## 2019-02-27 DIAGNOSIS — J309 Allergic rhinitis, unspecified: Secondary | ICD-10-CM | POA: Diagnosis not present

## 2019-03-07 NOTE — Progress Notes (Signed)
VIALS EXP 03-06-20

## 2019-03-08 DIAGNOSIS — J3089 Other allergic rhinitis: Secondary | ICD-10-CM | POA: Diagnosis not present

## 2019-03-20 ENCOUNTER — Ambulatory Visit (INDEPENDENT_AMBULATORY_CARE_PROVIDER_SITE_OTHER): Payer: BC Managed Care – PPO

## 2019-03-20 DIAGNOSIS — J309 Allergic rhinitis, unspecified: Secondary | ICD-10-CM

## 2019-03-29 IMAGING — US US ABDOMEN COMPLETE
1 series · 13 of 25 positions shown · non-contrast
Comparison: None.

CLINICAL DATA: Nonalcoholic steatohepatitis.

EXAM:
ABDOMEN ULTRASOUND COMPLETE

[Series 1: us abdomen complete · 0.17mm/px · 13 of 74 slices shown]
[im 1/74]
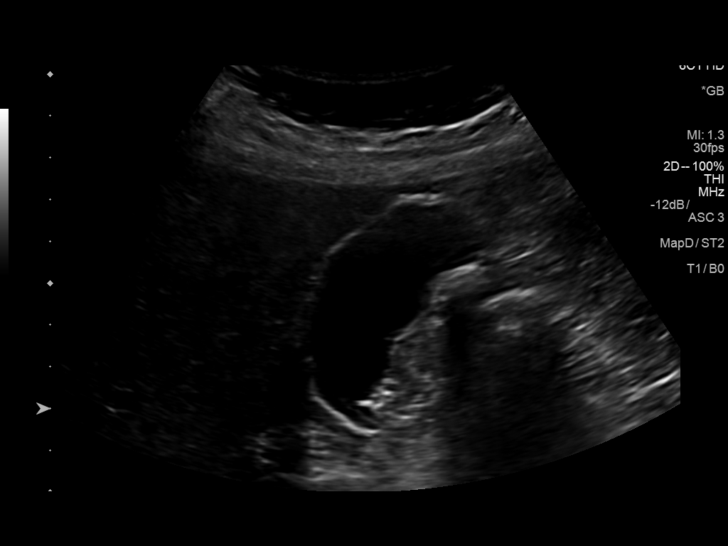
[im 7/74]
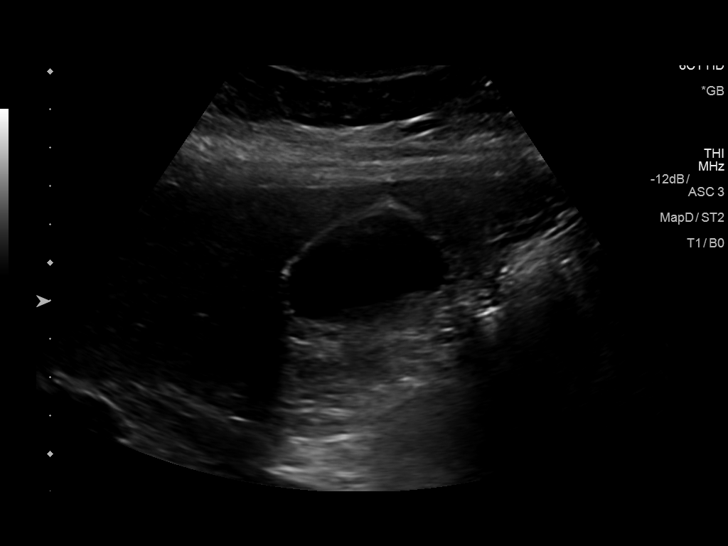
[im 13/74]
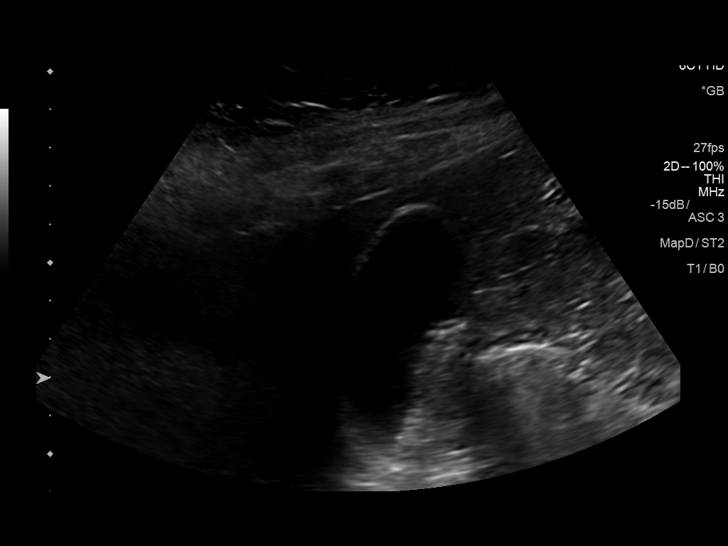
[im 19/74]
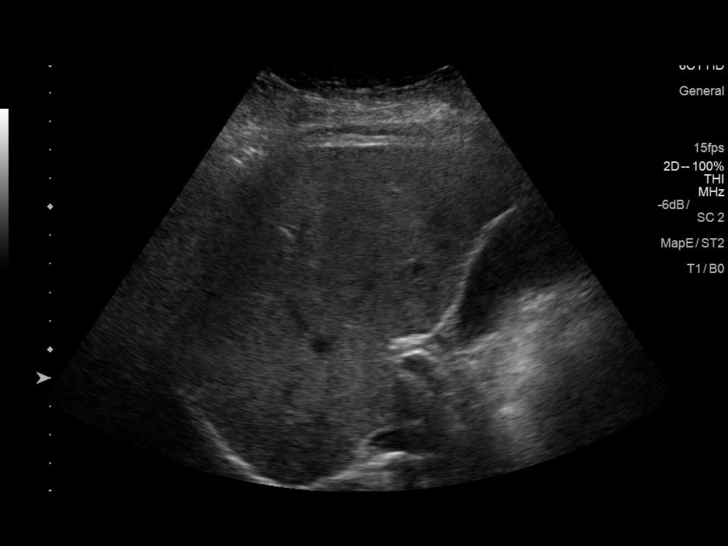
[im 25/74]
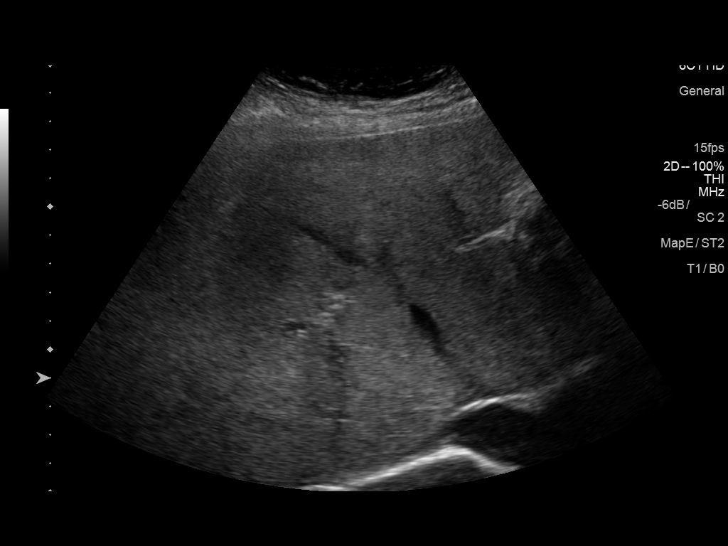
[im 31/74]
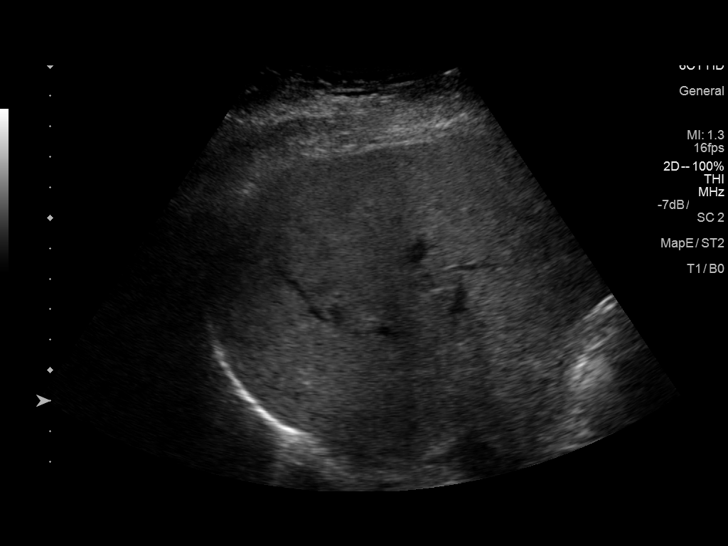
[im 37/74]
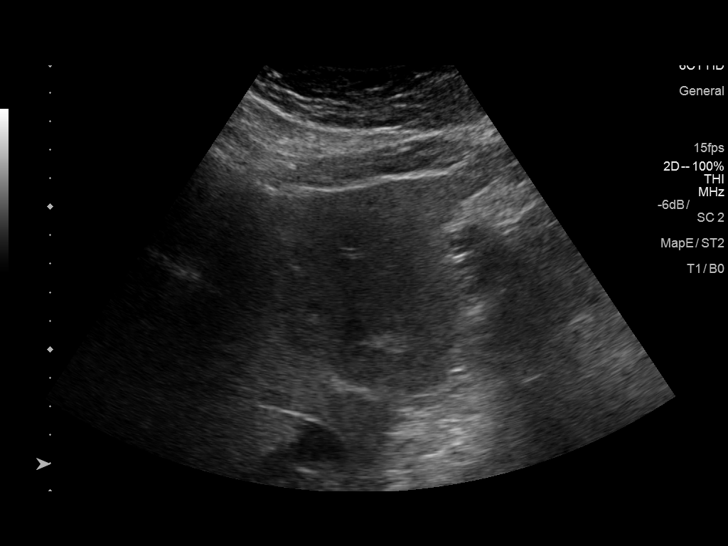
[im 43/74]
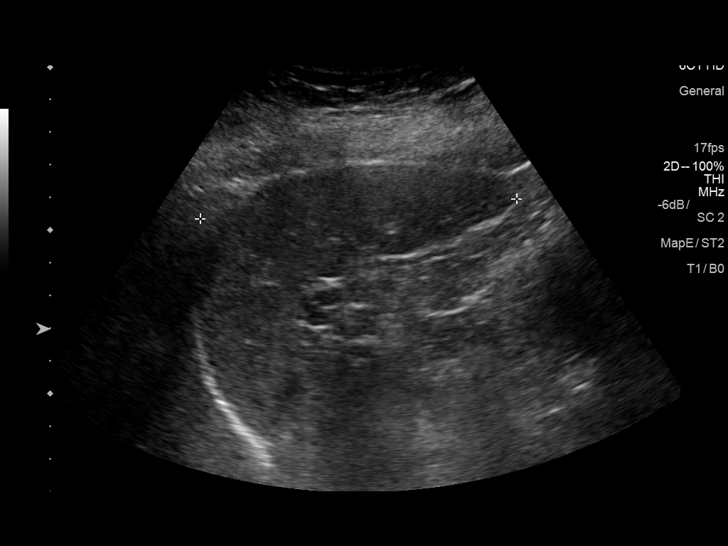
[im 49/74]
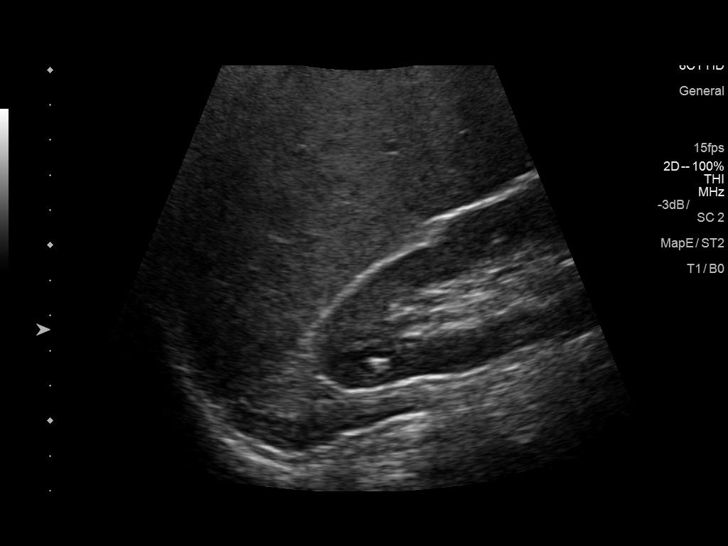
[im 55/74]
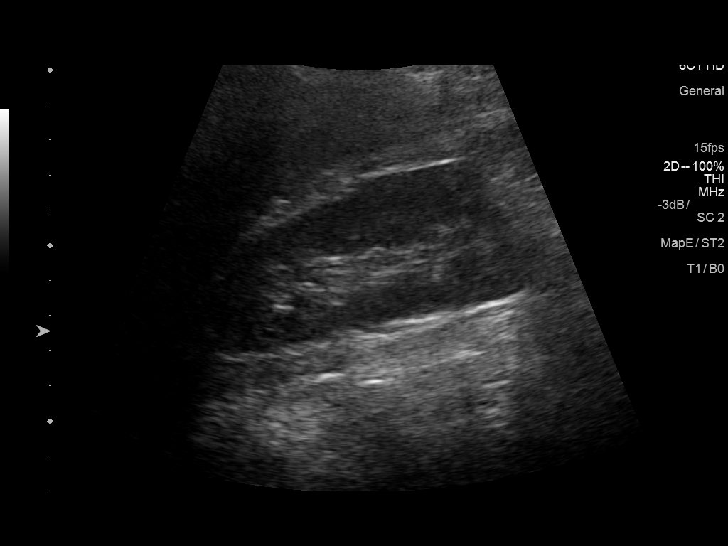
[im 61/74]
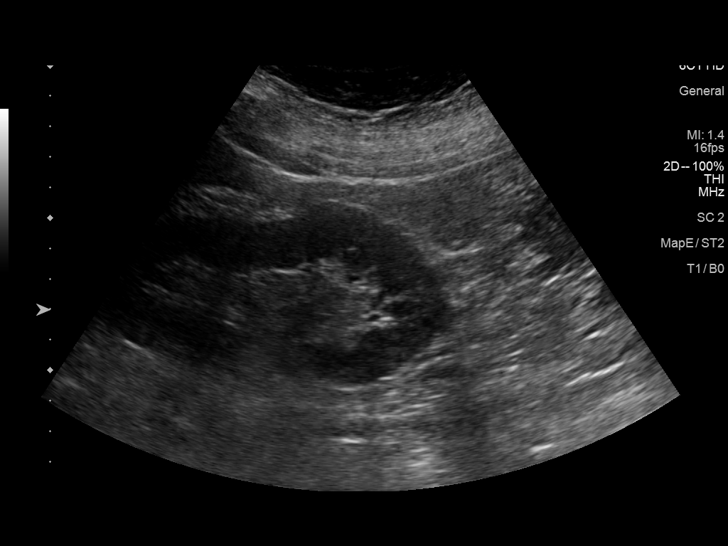
[im 67/74]
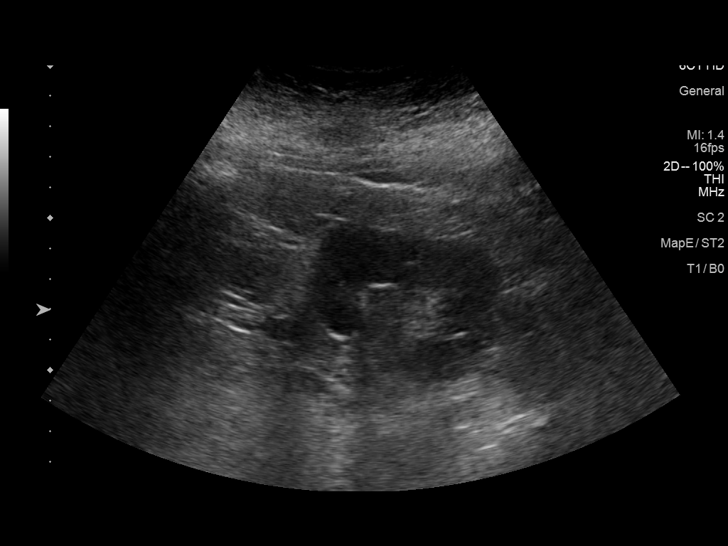
[im 74/74]
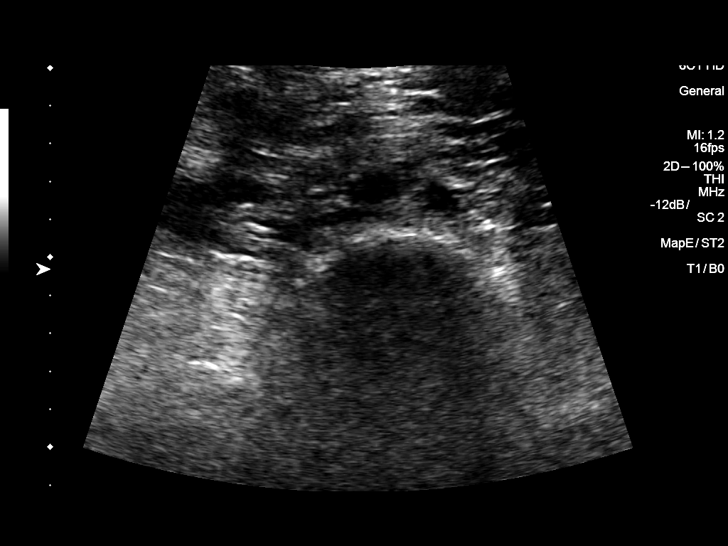

[13 of 25 positions shown; findings below may reference images not displayed]

FINDINGS: Gallbladder: No gallstones or wall thickening visualized. No
sonographic Murphy sign noted by sonographer.

Common bile duct: Diameter: 4 mm which is within normal limits.

Liver: No focal lesion identified. Mildly increased echogenicity of
hepatic parenchyma is noted suggesting fatty infiltration. Portal
vein is patent on color Doppler imaging with normal direction of
blood flow towards the liver.

IVC: Not well visualized.

Pancreas: Visualized portion unremarkable.

Spleen: Size and appearance within normal limits.

Right Kidney: Length: 10.6 cm. Echogenicity within normal limits. No
hydronephrosis visualized. 7 mm echogenic focus is noted in upper
pole which is nonshadowing, and is concerning for angiomyolipoma.

Left Kidney: Length: 11.5 cm. Echogenicity within normal limits. No
mass or hydronephrosis visualized.

Abdominal aorta: No aneurysm visualized.

Other findings: None.
IMPRESSION: Mildly increased echogenicity of hepatic parenchyma is noted
suggesting fatty infiltration.

7 mm echogenic focus noted in upper pole of right kidney most
consistent with angiomyolipoma.

## 2019-04-10 ENCOUNTER — Ambulatory Visit (INDEPENDENT_AMBULATORY_CARE_PROVIDER_SITE_OTHER): Payer: BC Managed Care – PPO

## 2019-04-10 DIAGNOSIS — J309 Allergic rhinitis, unspecified: Secondary | ICD-10-CM

## 2019-04-17 ENCOUNTER — Ambulatory Visit (INDEPENDENT_AMBULATORY_CARE_PROVIDER_SITE_OTHER): Payer: BC Managed Care – PPO

## 2019-04-17 DIAGNOSIS — J309 Allergic rhinitis, unspecified: Secondary | ICD-10-CM

## 2019-04-24 ENCOUNTER — Ambulatory Visit (INDEPENDENT_AMBULATORY_CARE_PROVIDER_SITE_OTHER): Payer: BC Managed Care – PPO | Admitting: *Deleted

## 2019-04-24 DIAGNOSIS — J309 Allergic rhinitis, unspecified: Secondary | ICD-10-CM | POA: Diagnosis not present

## 2019-05-01 ENCOUNTER — Ambulatory Visit: Payer: Self-pay | Admitting: *Deleted

## 2019-05-01 ENCOUNTER — Ambulatory Visit (INDEPENDENT_AMBULATORY_CARE_PROVIDER_SITE_OTHER): Payer: BC Managed Care – PPO | Admitting: *Deleted

## 2019-05-01 DIAGNOSIS — J309 Allergic rhinitis, unspecified: Secondary | ICD-10-CM | POA: Diagnosis not present

## 2019-05-02 ENCOUNTER — Other Ambulatory Visit: Payer: Self-pay

## 2019-05-02 ENCOUNTER — Ambulatory Visit: Payer: BC Managed Care – PPO | Admitting: Allergy and Immunology

## 2019-05-02 ENCOUNTER — Encounter: Payer: Self-pay | Admitting: Allergy and Immunology

## 2019-05-02 VITALS — BP 136/70 | HR 70 | Temp 98.0°F | Resp 16 | Ht 67.0 in | Wt 279.8 lb

## 2019-05-02 DIAGNOSIS — J4530 Mild persistent asthma, uncomplicated: Secondary | ICD-10-CM | POA: Diagnosis not present

## 2019-05-02 DIAGNOSIS — J3089 Other allergic rhinitis: Secondary | ICD-10-CM

## 2019-05-02 DIAGNOSIS — T7800XA Anaphylactic reaction due to unspecified food, initial encounter: Secondary | ICD-10-CM

## 2019-05-02 NOTE — Patient Instructions (Addendum)
Mild persistent asthma Currently well-controlled.  Continue Arnuity Ellipta 100 g, 1 inhalation daily, and albuterol HFA, 1-2 inhalations every 4-6 hours if needed.  During upper respiratory tract infections and asthma flares, use Brio Ellipta 100-25 g, 1 inhalation daily, until symptoms have returned to baseline.  Subjective and objective measures of pulmonary function will be followed and the treatment plan will be adjusted accordingly.  Allergic rhinitis  Continue appropriate allergen avoidance measures and immunotherapy injections as prescribed.  If needed, add carbinoxamine and/or azelastine nasal spray.  Nasal saline lavage (NeilMed) has been recommended as needed and prior to medicated nasal sprays along with instructions for proper administration.  Food Allergy  Continue meticulous avoidance of mammalian meat, shellfish, and fish and have access to epinephrine autoinjector 2 pack in case of accidental ingestion.  Food allergy action plan is in place.   Return in about 5 months (around 09/30/2019), or if symptoms worsen or fail to improve.

## 2019-05-02 NOTE — Assessment & Plan Note (Signed)
   Continue appropriate allergen avoidance measures and immunotherapy injections as prescribed.  If needed, add carbinoxamine and/or azelastine nasal spray.  Nasal saline lavage (NeilMed) has been recommended as needed and prior to medicated nasal sprays along with instructions for proper administration.

## 2019-05-02 NOTE — Progress Notes (Signed)
Follow-up Note  RE: Monica Good MRN: [id number] DOB: 07-21-1965 Date of Office Visit: 05/02/2019  Primary care provider: Carol Ada, MD Referring provider: Carol Ada, MD  History of present illness: Monica Good is a 53 y.o. female with persistent asthma, allergic rhinitis, and food allergy presenting today for follow-up.  She was last seen in this clinic on Oct 25, 2018.  She reports that in the interval since her previous visit her asthma has been well controlled.  Over the past several months she has not required asthma rescue medication, experienced nocturnal awakenings due to lower respiratory symptoms, nor have activities of daily living been limited.  She notes that she has not been very active due to COVID-19 social restrictions.  Her nasal allergy symptoms are well controlled with allergy immunotherapy injections per protocol and allergy medications as needed.  She carefully avoids fish, shellfish, and mammalian meat and has access to epinephrine autoinjectors.  Assessment and plan: Mild persistent asthma Currently well-controlled.  Continue Arnuity Ellipta 100 g, 1 inhalation daily, and albuterol HFA, 1-2 inhalations every 4-6 hours if needed.  During upper respiratory tract infections and asthma flares, use Brio Ellipta 100-25 g, 1 inhalation daily, until symptoms have returned to baseline.  Subjective and objective measures of pulmonary function will be followed and the treatment plan will be adjusted accordingly.  Allergic rhinitis  Continue appropriate allergen avoidance measures and immunotherapy injections as prescribed.  If needed, add carbinoxamine and/or azelastine nasal spray.  Nasal saline lavage (NeilMed) has been recommended as needed and prior to medicated nasal sprays along with instructions for proper administration.  Food Allergy  Continue meticulous avoidance of mammalian meat, shellfish, and fish and have access to epinephrine  autoinjector 2 pack in case of accidental ingestion.  Food allergy action plan is in place.   Diagnostics: Spirometry:  Normal with an FEV1 of 93% predicted. This study was performed while the patient was asymptomatic.  Please see scanned spirometry results for details.    Physical examination: Blood pressure 136/70, pulse 70, temperature 98 F (36.7 C), temperature source Oral, resp. rate 16, height 5' 7"  (1.702 m), weight 279 lb 12.2 oz (126.9 kg), SpO2 97 %.  General: Alert, interactive, in no acute distress. HEENT: TMs pearly gray, turbinates minimally edematous without discharge, post-pharynx mildly erythematous. Neck: Supple without lymphadenopathy. Lungs: Clear to auscultation without wheezing, rhonchi or rales. CV: Normal S1, S2 without murmurs. Skin: Warm and dry, without lesions or rashes.  The following portions of the patient's history were reviewed and updated as appropriate: allergies, current medications, past family history, past medical history, past social history, past surgical history and problem list.  Current Outpatient Medications  Medication Sig Dispense Refill  . Acetylcysteine (NAC PO) Take 1 tablet by mouth daily.     . Albuterol Sulfate (PROVENTIL HFA IN) Inhale into the lungs.    . Ascorbic Acid (VITAMIN C) POWD Take by mouth. 5tsp prn    . fluocinonide ointment (LIDEX) 0.05 % APPLY AA ONLY ON LEGS UNTIL CLEAR  0  . Fluticasone Furoate (ARNUITY ELLIPTA) 100 MCG/ACT AEPB Inhale 1 puff into the lungs daily. 30 each 4  . losartan (COZAAR) 50 MG tablet TK 1 T PO  BID  5  . Magnesium (OPTIMAG 125 PO) Take by mouth.    . NON FORMULARY Hemp Symmetry  - one dropper full po at bedtime    . potassium chloride (K-DUR) 10 MEQ tablet TK 1 T PO ONCE D    .  UNABLE TO FIND ALAmax CR    . UNABLE TO FIND 5HTP CR 147m 1 tab twice daily    . UNABLE TO FIND Chromate GTF Cromium polynicotinate 6028m2 capsules twice daily    . UNABLE TO FIND Low dose naltrexone 4.19m73m at  bedtime    . Vitamin A 2250 MCG (7500 UT) CAPS Take by mouth.    . Vitamin D, Ergocalciferol, (DRISDOL) 50000 units CAPS capsule Take 50,000 Units by mouth. Every 6 days    . buPROPion (WELLBUTRIN XL) 150 MG 24 hr tablet TK 1 T PO QD    . MILK THISTLE PO Take by mouth.     No current facility-administered medications for this visit.     Allergies  Allergen Reactions  . Beef-Derived Products Anaphylaxis  . Cat Hair Extract Anaphylaxis  . Fish Allergy Anaphylaxis  . Lambs Quarters Anaphylaxis    Has not had but is warned by allergist that could have anaphylaxis.  . Other Anaphylaxis    **SEAFOOD**  . Peanut Oil Anaphylaxis  . Pork-Derived Products Anaphylaxis  . Shellfish Allergy Anaphylaxis  . Bee Venom Swelling  . Latex   . Pollen Extract     I appreciate the opportunity to take part in Maeson's care. Please do not hesitate to contact me with questions.  Sincerely,   R. CarEdgar FriskD

## 2019-05-02 NOTE — Assessment & Plan Note (Signed)
   Continue meticulous avoidance of mammalian meat, shellfish, and fish and have access to epinephrine autoinjector 2 pack in case of accidental ingestion.  Food allergy action plan is in place.

## 2019-05-02 NOTE — Assessment & Plan Note (Signed)
Currently well-controlled.  Continue Arnuity Ellipta 100 g, 1 inhalation daily, and albuterol HFA, 1-2 inhalations every 4-6 hours if needed.  During upper respiratory tract infections and asthma flares, use Brio Ellipta 100-25 g, 1 inhalation daily, until symptoms have returned to baseline.  Subjective and objective measures of pulmonary function will be followed and the treatment plan will be adjusted accordingly.

## 2019-05-09 ENCOUNTER — Ambulatory Visit (INDEPENDENT_AMBULATORY_CARE_PROVIDER_SITE_OTHER): Payer: BC Managed Care – PPO | Admitting: *Deleted

## 2019-05-09 DIAGNOSIS — J309 Allergic rhinitis, unspecified: Secondary | ICD-10-CM | POA: Diagnosis not present

## 2019-05-15 ENCOUNTER — Ambulatory Visit (INDEPENDENT_AMBULATORY_CARE_PROVIDER_SITE_OTHER): Payer: BC Managed Care – PPO | Admitting: *Deleted

## 2019-05-15 DIAGNOSIS — J309 Allergic rhinitis, unspecified: Secondary | ICD-10-CM | POA: Diagnosis not present

## 2019-06-05 ENCOUNTER — Ambulatory Visit (INDEPENDENT_AMBULATORY_CARE_PROVIDER_SITE_OTHER): Payer: BC Managed Care – PPO

## 2019-06-05 DIAGNOSIS — J309 Allergic rhinitis, unspecified: Secondary | ICD-10-CM | POA: Diagnosis not present

## 2019-06-26 ENCOUNTER — Ambulatory Visit (INDEPENDENT_AMBULATORY_CARE_PROVIDER_SITE_OTHER): Payer: BC Managed Care – PPO

## 2019-06-26 DIAGNOSIS — J309 Allergic rhinitis, unspecified: Secondary | ICD-10-CM | POA: Diagnosis not present

## 2019-07-17 ENCOUNTER — Ambulatory Visit (INDEPENDENT_AMBULATORY_CARE_PROVIDER_SITE_OTHER): Payer: BC Managed Care – PPO

## 2019-07-17 DIAGNOSIS — J309 Allergic rhinitis, unspecified: Secondary | ICD-10-CM

## 2019-08-07 ENCOUNTER — Ambulatory Visit (INDEPENDENT_AMBULATORY_CARE_PROVIDER_SITE_OTHER): Payer: BC Managed Care – PPO

## 2019-08-07 DIAGNOSIS — J309 Allergic rhinitis, unspecified: Secondary | ICD-10-CM

## 2019-08-13 DIAGNOSIS — E509 Vitamin A deficiency, unspecified: Secondary | ICD-10-CM | POA: Diagnosis not present

## 2019-08-13 DIAGNOSIS — E061 Subacute thyroiditis: Secondary | ICD-10-CM | POA: Diagnosis not present

## 2019-08-13 DIAGNOSIS — E069 Thyroiditis, unspecified: Secondary | ICD-10-CM | POA: Diagnosis not present

## 2019-08-13 DIAGNOSIS — E559 Vitamin D deficiency, unspecified: Secondary | ICD-10-CM | POA: Diagnosis not present

## 2019-08-24 ENCOUNTER — Encounter: Payer: Self-pay | Admitting: Obstetrics and Gynecology

## 2019-08-24 DIAGNOSIS — Z1231 Encounter for screening mammogram for malignant neoplasm of breast: Secondary | ICD-10-CM | POA: Diagnosis not present

## 2019-08-28 ENCOUNTER — Ambulatory Visit (INDEPENDENT_AMBULATORY_CARE_PROVIDER_SITE_OTHER): Payer: BC Managed Care – PPO | Admitting: *Deleted

## 2019-08-28 DIAGNOSIS — J309 Allergic rhinitis, unspecified: Secondary | ICD-10-CM

## 2019-09-04 NOTE — Progress Notes (Signed)
VIALS EXP 09-03-20

## 2019-09-05 DIAGNOSIS — J3089 Other allergic rhinitis: Secondary | ICD-10-CM | POA: Diagnosis not present

## 2019-09-18 ENCOUNTER — Ambulatory Visit (INDEPENDENT_AMBULATORY_CARE_PROVIDER_SITE_OTHER): Payer: BC Managed Care – PPO

## 2019-09-18 DIAGNOSIS — J309 Allergic rhinitis, unspecified: Secondary | ICD-10-CM

## 2019-10-03 ENCOUNTER — Encounter: Payer: Self-pay | Admitting: Pediatrics

## 2019-10-03 ENCOUNTER — Ambulatory Visit: Payer: BC Managed Care – PPO | Admitting: Pediatrics

## 2019-10-03 ENCOUNTER — Other Ambulatory Visit: Payer: Self-pay

## 2019-10-03 VITALS — BP 128/80 | HR 70 | Temp 97.2°F | Resp 18 | Ht 68.0 in | Wt 288.2 lb

## 2019-10-03 DIAGNOSIS — T7800XA Anaphylactic reaction due to unspecified food, initial encounter: Secondary | ICD-10-CM

## 2019-10-03 DIAGNOSIS — J4530 Mild persistent asthma, uncomplicated: Secondary | ICD-10-CM | POA: Diagnosis not present

## 2019-10-03 DIAGNOSIS — J3089 Other allergic rhinitis: Secondary | ICD-10-CM | POA: Diagnosis not present

## 2019-10-03 DIAGNOSIS — I10 Essential (primary) hypertension: Secondary | ICD-10-CM

## 2019-10-03 DIAGNOSIS — Z91018 Allergy to other foods: Secondary | ICD-10-CM

## 2019-10-03 DIAGNOSIS — Z6841 Body Mass Index (BMI) 40.0 and over, adult: Secondary | ICD-10-CM

## 2019-10-03 MED ORDER — ALBUTEROL SULFATE HFA 108 (90 BASE) MCG/ACT IN AERS: 2.0000 | INHALATION_SPRAY | RESPIRATORY_TRACT | 1 refills | Status: DC | PRN

## 2019-10-03 MED ORDER — EPINEPHRINE 0.3 MG/0.3ML IJ SOAJ: 0.3000 mg | Freq: Once | INTRAMUSCULAR | 1 refills | Status: AC

## 2019-10-03 NOTE — Patient Instructions (Signed)
Zyrtec 10 mg-take 1 tablet once a day if needed for runny nose or itchy eyes Fluticasone 2 sprays per nostril once a day if needed for stuffy nose Allergy injections every 4 weeks if she does well in the springtime  Arnuity Ellipta 100-1 puff once a day to prevent coughing or wheezing Albuterol HFA-2 puffs every 4 hours if needed for coughing or wheezing.  You may use albuterol 2 puffs 5 to 15 minutes before exercise  Avoid fish, shellfish and mammalian meats.  If you have an allergic reaction take Benadryl 50 mg every 4 hours and if you have life-threatening symptoms inject with EpiPen 0.3 mg  Call us if you are not doing well on this treatment plan

## 2019-10-03 NOTE — Progress Notes (Signed)
[place] [address] Dept: [phone]  FOLLOW UP NOTE  Patient ID: Monica Good, female    DOB: 08-04-1965  Age: 54 y.o. MRN: [id number] Date of Office Visit: 10/03/2019  Assessment  Chief Complaint: Asthma  HPI Monica Good presents for follow-up of asthma, food allergies and allergic rhinitis.  Her allergic rhinitis is well controlled and she has been on allergy injections every 3 weeks.  Her asthma is well controlled with the use of Arnuity Ellipta 100-1 puff once a day.  She very rarely has to use albuterol.  She continues to avoid fish, shellfish and mammalian meat.  She has alpha gal allergy and would like to continue avoiding mammalian meats   Drug Allergies:  Allergies  Allergen Reactions  . Beef-Derived Products Anaphylaxis  . Cat Hair Extract Anaphylaxis  . Fish Allergy Anaphylaxis  . Lambs Quarters Anaphylaxis    Has not had but is warned by allergist that could have anaphylaxis.  . Other Anaphylaxis    **SEAFOOD**  . Peanut Oil Anaphylaxis  . Pork-Derived Products Anaphylaxis  . Shellfish Allergy Anaphylaxis  . Bee Venom Swelling  . Latex   . Pollen Extract     Physical Exam: BP 128/80   Pulse 70   Temp (!) 97.2 F (36.2 C) (Temporal)   Resp 18   Ht 5' 8"  (1.727 m)   Wt 288 lb 3.2 oz (130.7 kg)   LMP  (LMP Unknown)   SpO2 97%   BMI 43.82 kg/m    Physical Exam Vitals reviewed.  Constitutional:      Appearance: Normal appearance. She is obese.  HENT:     Head:     Comments: Eyes normal.  Ears normal.  Nose normal.  Pharynx normal. Cardiovascular:     Rate and Rhythm: Normal rate and regular rhythm.     Comments: S1-S2 normal no murmurs Pulmonary:     Comments: Clear to percussion and auscultation Musculoskeletal:     Cervical back: Neck supple.  Neurological:     General: No focal deficit present.     Mental Status: She is alert and oriented to person, place, and time. Mental status is at baseline.    Psychiatric:        Mood and Affect: Mood normal.        Behavior: Behavior normal.        Thought Content: Thought content normal.        Judgment: Judgment normal.     Diagnostics: FVC 3.26 L FEV1 2.59 L.  Predicted FVC 3.95 L predicted FEV1 3.09 L-the spirometry is in the normal range  Assessment and Plan: 1. Mild persistent asthma without complication   2. Food Allergy   3. Allergic rhinitis   4. Morbid obesity with BMI of 40.0-44.9, adult ([place])   5. Allergy to alpha-gal   6. Essential hypertension     Meds ordered this encounter  Medications  . albuterol (PROVENTIL HFA) 108 (90 Base) MCG/ACT inhaler    Sig: Inhale 2 puffs into the lungs every 4 (four) hours as needed.    Dispense:  18 g    Refill:  1  . EPINEPHrine 0.3 mg/0.3 mL IJ SOAJ injection    Sig: Inject 0.3 mLs (0.3 mg total) into the muscle once for 1 dose.    Dispense:  1 each    Refill:  1    Patient Instructions  Zyrtec 10 mg-take 1 tablet once a day if needed for runny nose  or itchy eyes Fluticasone 2 sprays per nostril once a day if needed for stuffy nose Allergy injections every 4 weeks if she does well in the springtime  Arnuity Ellipta 100-1 puff once a day to prevent coughing or wheezing Albuterol HFA-2 puffs every 4 hours if needed for coughing or wheezing.  You may use albuterol 2 puffs 5 to 15 minutes before exercise  Avoid fish, shellfish and mammalian meats.  If you have an allergic reaction take Benadryl 50 mg every 4 hours and if you have life-threatening symptoms inject with EpiPen 0.3 mg  Call us if you are not doing well on this treatment plan   Return in about 1 year (around 10/02/2020).    Thank you for the opportunity to care for this patient.  Please do not hesitate to contact me with questions.  Penne Lash, M.D.  [facility] [address] [place], [place] [address] [phone]

## 2019-10-09 ENCOUNTER — Ambulatory Visit (INDEPENDENT_AMBULATORY_CARE_PROVIDER_SITE_OTHER): Payer: BC Managed Care – PPO

## 2019-10-09 DIAGNOSIS — J309 Allergic rhinitis, unspecified: Secondary | ICD-10-CM | POA: Diagnosis not present

## 2019-10-16 ENCOUNTER — Ambulatory Visit (INDEPENDENT_AMBULATORY_CARE_PROVIDER_SITE_OTHER): Payer: BC Managed Care – PPO

## 2019-10-16 DIAGNOSIS — J309 Allergic rhinitis, unspecified: Secondary | ICD-10-CM

## 2019-10-23 ENCOUNTER — Ambulatory Visit (INDEPENDENT_AMBULATORY_CARE_PROVIDER_SITE_OTHER): Payer: BC Managed Care – PPO

## 2019-10-23 DIAGNOSIS — J309 Allergic rhinitis, unspecified: Secondary | ICD-10-CM | POA: Diagnosis not present

## 2019-10-30 ENCOUNTER — Ambulatory Visit (INDEPENDENT_AMBULATORY_CARE_PROVIDER_SITE_OTHER): Payer: BC Managed Care – PPO

## 2019-10-30 DIAGNOSIS — J309 Allergic rhinitis, unspecified: Secondary | ICD-10-CM

## 2019-11-06 ENCOUNTER — Ambulatory Visit (INDEPENDENT_AMBULATORY_CARE_PROVIDER_SITE_OTHER): Payer: BC Managed Care – PPO

## 2019-11-06 DIAGNOSIS — J309 Allergic rhinitis, unspecified: Secondary | ICD-10-CM | POA: Diagnosis not present

## 2019-11-13 ENCOUNTER — Ambulatory Visit: Payer: Self-pay

## 2019-11-13 DIAGNOSIS — J309 Allergic rhinitis, unspecified: Secondary | ICD-10-CM

## 2019-11-13 NOTE — Progress Notes (Addendum)
Patient received 0.5cc from incorrect vial containing M-C-D-CR. Patient notified after injection given and waited in office 30 minutes post injection. 1+ local reaction with no systemic symptoms reported. Provider aware of incorrect injection. Follow up call to patient at 605pm and reported a 2+ size local reaction with no systemic symptoms. Patient advised to take extra antihistamine if needed for itching/local reaction. Patient will receive 0.4cc from her current red vials at next injection.

## 2019-11-14 NOTE — Progress Notes (Signed)
Follow up call placed to patient at 1108 am. Patient reports that she is feeling great. No further local reaction on her arm. The small bit of swelling that was there yesterday has since resolved. I let her know to come in next week for her injections. I also let her know to call back if she needed anything else.

## 2019-11-20 ENCOUNTER — Ambulatory Visit (INDEPENDENT_AMBULATORY_CARE_PROVIDER_SITE_OTHER): Payer: BC Managed Care – PPO

## 2019-11-20 DIAGNOSIS — J309 Allergic rhinitis, unspecified: Secondary | ICD-10-CM

## 2019-12-03 ENCOUNTER — Ambulatory Visit (INDEPENDENT_AMBULATORY_CARE_PROVIDER_SITE_OTHER): Payer: BC Managed Care – PPO

## 2019-12-03 DIAGNOSIS — J309 Allergic rhinitis, unspecified: Secondary | ICD-10-CM

## 2019-12-14 ENCOUNTER — Other Ambulatory Visit: Payer: Self-pay | Admitting: Allergy and Immunology

## 2019-12-18 ENCOUNTER — Ambulatory Visit: Payer: BC Managed Care – PPO | Admitting: Obstetrics and Gynecology

## 2019-12-18 ENCOUNTER — Other Ambulatory Visit: Payer: Self-pay

## 2019-12-18 ENCOUNTER — Encounter: Payer: Self-pay | Admitting: Obstetrics and Gynecology

## 2019-12-18 VITALS — BP 122/86 | HR 84 | Resp 14 | Ht 67.25 in | Wt 287.0 lb

## 2019-12-18 DIAGNOSIS — L723 Sebaceous cyst: Secondary | ICD-10-CM | POA: Diagnosis not present

## 2019-12-18 DIAGNOSIS — Z01419 Encounter for gynecological examination (general) (routine) without abnormal findings: Secondary | ICD-10-CM

## 2019-12-18 NOTE — Progress Notes (Signed)
54 y.o. G57P4004 Married Caucasian female here for annual exam.    No period for one full year.  Having hot flashes and they are manageable.   Thinks she may have some acne on her bottom.  There is some drainage coming out.  Still working from home for [employer].   Difficult to do exercise due to heat and knee issues.  She is fully vaccinated against Covid.   PCP:   Carol Ada, MD   No LMP recorded (lmp unknown). Patient is postmenopausal.           Sexually active: Yes.    The current method of family planning is post menopausal status.    Exercising: No.  The patient does not participate in regular exercise at present. Smoker:  no  Health Maintenance: Pap:  12-11-2018 negative, HR HPV negative          11-07-15 negative, HR HPV negative  History of abnormal Pap:  no MMG: 08-24-19 [facility], normal per patient  Colonoscopy:  12-07-2018 polyps removed.   Due in 2025.  BMD:   no  Result  n/a TDaP:  11-25-16  HIV: with pregnancies, negative  Hep C: 12-11-2018 negative  Screening Labs:  Hb today: PCP, Urine today: not collected    reports that she has never smoked. She has never used smokeless tobacco. She reports current alcohol use. She reports that she does not use drugs.  Past Medical History:  Diagnosis Date  . Elevated alkaline phosphatase level 10/21/2017   138 - Dr. Geryl Rankins  . Elevated ALT measurement 10/21/2017   62 - Dr. Theador Hawthorne  . Elevated cholesterol 10/21/2017   Dr. Geryl Rankins  . Hashimoto's disease   . Hypertension    asthma related no medication taken  . Plantar fasciitis 2007  . Thigh DVT (deep venous thrombosis) ([place]) 1995    Past Surgical History:  Procedure Laterality Date  . HAND SURGERY Right 2006  . KNEE ARTHROSCOPY Left 04/2010  . LAPAROSCOPY  1991  . SHOULDER ARTHROSCOPY  2004    Current Outpatient Medications  Medication Sig Dispense Refill  . Acetylcysteine (NAC PO) Take 1 tablet by mouth daily.     Marland Kitchen albuterol (PROVENTIL HFA) 108 (90  Base) MCG/ACT inhaler Inhale 2 puffs into the lungs every 4 (four) hours as needed. 18 g 1  . ARNUITY ELLIPTA 100 MCG/ACT AEPB INHALE ONE PUFF INTO LUNGS DAILY 90 each 3  . Ascorbic Acid (VITAMIN C) POWD Take by mouth. 5tsp prn    . buPROPion (WELLBUTRIN XL) 150 MG 24 hr tablet TK 1 T PO QD    . EPINEPHrine 0.3 mg/0.3 mL IJ SOAJ injection SMARTSIG:0.3 Milliliter(s) IM Once    . losartan (COZAAR) 50 MG tablet TK 1 T PO  BID  5  . Magnesium (OPTIMAG 125 PO) Take by mouth.    . NON FORMULARY Hemp Symmetry  - one dropper full po at bedtime    . potassium chloride (K-DUR) 10 MEQ tablet TK 1 T PO ONCE D    . UNABLE TO FIND ALAmax CR    . UNABLE TO FIND 5HTP CR 144m 1 tab twice daily    . UNABLE TO FIND Chromate GTF Cromium polynicotinate 6044m2 capsules twice daily    . UNABLE TO FIND Low dose naltrexone 4.43m64m at bedtime    . Vitamin A 2250 MCG (7500 UT) CAPS Take by mouth.    . Vitamin D, Ergocalciferol, (DRISDOL) 50000 units CAPS capsule Take 50,000  Units by mouth. Every 6 days     No current facility-administered medications for this visit.    Family History  Problem Relation Age of Onset  . Arthritis Mother   . Hypertension Mother   . Diabetes Mother   . Allergic rhinitis Neg Hx   . Angioedema Neg Hx   . Eczema Neg Hx   . Asthma Neg Hx   . Immunodeficiency Neg Hx   . Urticaria Neg Hx     Review of Systems  Constitutional: Negative.   HENT: Negative.   Eyes: Negative.   Respiratory: Negative.   Cardiovascular: Negative.   Gastrointestinal: Negative.   Endocrine: Negative.   Genitourinary:       Vulvar bumps, "similar to acne"   Musculoskeletal: Negative.   Skin: Negative.   Allergic/Immunologic: Negative.   Neurological: Negative.   Hematological: Negative.   Psychiatric/Behavioral: Negative.     Exam:   BP 122/86 (BP Location: Right Arm, Patient Position: Sitting, Cuff Size: Large)   Pulse 84   Resp 14   Ht 5' 7.25" (1.708 m)   Wt 287 lb (130.2 kg)   LMP   (LMP Unknown)   BMI 44.62 kg/m     General appearance: alert, cooperative and appears stated age Head: normocephalic, without obvious abnormality, atraumatic Neck: no adenopathy, supple, symmetrical, trachea midline and thyroid normal to inspection and palpation Lungs: clear to auscultation bilaterally Breasts: normal appearance, no masses or tenderness, No nipple retraction or dimpling, No nipple discharge or bleeding, No axillary adenopathy Heart: regular rate and rhythm Abdomen: soft, non-tender; no masses, no organomegaly Extremities: extremities normal, atraumatic, no cyanosis or edema Skin: skin color, texture, turgor normal. No rashes or lesions Lymph nodes: cervical, supraclavicular, and axillary nodes normal. Neurologic: grossly normal  Pelvic: External genitalia:  Sebaceous cysts of vulva.              No abnormal inguinal nodes palpated.              Urethra:  normal appearing urethra with no masses, tenderness or lesions              Bartholins and Skenes: normal                 Vagina: normal appearing vagina with normal color and discharge, no lesions              Cervix: no lesions              Pap taken: No. Bimanual Exam:  Uterus:  normal size, contour, position, consistency, mobility, non-tender              Adnexa: no mass, fullness, tenderness              Rectal exam: Yes.  .  Confirms.              Anus:  normal sphincter tone, no lesions  Chaperone was present for exam.  Assessment:   Well woman visit with normal exam. Postmenopausal female.  Hx DVT.  Hx low vit D.  On high dose vit D.  Sebaceous cysts of the vulva.   Plan: Mammogram screening discussed. Self breast awareness reviewed. Pap and HR HPV as above.  New ASCCP guidelines discussed.  Guidelines for Calcium, Vitamin D, regular exercise program including cardiovascular and weight bearing exercise. Educated about sebaceous cysts.  I encouraged her to not drain these and call if an area becomes  infected - red and sore. Follow up annually  and prn.   After visit summary provided.

## 2019-12-18 NOTE — Patient Instructions (Signed)
EXERCISE AND DIET:  We recommended that you start or continue a regular exercise program for good health. Regular exercise means any activity that makes your heart beat faster and makes you sweat.  We recommend exercising at least 30 minutes per day at least 3 days a week, preferably 4 or 5.  We also recommend a diet low in fat and sugar.  Inactivity, poor dietary choices and obesity can cause diabetes, heart attack, stroke, and kidney damage, among others.    ALCOHOL AND SMOKING:  Women should limit their alcohol intake to no more than 7 drinks/beers/glasses of wine (combined, not each!) per week. Moderation of alcohol intake to this level decreases your risk of breast cancer and liver damage. And of course, no recreational drugs are part of a healthy lifestyle.  And absolutely no smoking or even second hand smoke. Most people know smoking can cause heart and lung diseases, but did you know it also contributes to weakening of your bones? Aging of your skin?  Yellowing of your teeth and nails?  CALCIUM AND VITAMIN D:  Adequate intake of calcium and Vitamin D are recommended.  The recommendations for exact amounts of these supplements seem to change often, but generally speaking 600 mg of calcium (either carbonate or citrate) and 800 units of Vitamin D per day seems prudent. Certain women may benefit from higher intake of Vitamin D.  If you are among these women, your doctor will have told you during your visit.    PAP SMEARS:  Pap smears, to check for cervical cancer or precancers,  have traditionally been done yearly, although recent scientific advances have shown that most women can have pap smears less often.  However, every woman still should have a physical exam from her gynecologist every year. It will include a breast check, inspection of the vulva and vagina to check for abnormal growths or skin changes, a visual exam of the cervix, and then an exam to evaluate the size and shape of the uterus and  ovaries.  And after 56 years of age, a rectal exam is indicated to check for rectal cancers. We will also provide age appropriate advice regarding health maintenance, like when you should have certain vaccines, screening for sexually transmitted diseases, bone density testing, colonoscopy, mammograms, etc.   MAMMOGRAMS:  All women over 17 years old should have a yearly mammogram. Many facilities now offer a "3D" mammogram, which may cost around $50 extra out of pocket. If possible,  we recommend you accept the option to have the 3D mammogram performed.  It both reduces the number of women who will be called back for extra views which then turn out to be normal, and it is better than the routine mammogram at detecting truly abnormal areas.    COLONOSCOPY:  Colonoscopy to screen for colon cancer is recommended for all women at age 9.  We know, you hate the idea of the prep.  We agree, BUT, having colon cancer and not knowing it is worse!!  Colon cancer so often starts as a polyp that can be seen and removed at colonscopy, which can quite literally save your life!  And if your first colonoscopy is normal and you have no family history of colon cancer, most women don't have to have it again for 10 years.  Once every ten years, you can do something that may end up saving your life, right?  We will be happy to help you get it scheduled when you are ready.  Be sure to check your insurance coverage so you understand how much it will cost.  It may be covered as a preventative service at no cost, but you should check your particular policy.      Epidermal Cyst  An epidermal cyst is a small, painless lump under your skin. The cyst contains a grayish-white, bad-smelling substance (keratin). Do not try to pop or open an epidermal cyst yourself. What are the causes?  A blocked hair follicle.  A hair that curls and re-enters the skin instead of growing straight out of the skin.  A blocked pore.  Irritated  skin.  An injury to the skin.  Certain conditions that are passed along from parent to child (inherited).  Human papillomavirus (HPV).  Long-term sun damage to the skin. What increases the risk?  Having acne.  Being overweight.  Being 11-17 years old. What are the signs or symptoms? These cysts are usually harmless, but they can get infected. Symptoms of infection may include:  Redness.  Inflammation.  Tenderness.  Warmth.  Fever.  A grayish-white, bad-smelling substance drains from the cyst.  Pus drains from the cyst. How is this treated? In many cases, epidermal cysts go away on their own without treatment. If a cyst becomes infected, treatment may include:  Opening and draining the cyst, done by a doctor. After draining, you may need minor surgery to remove the rest of the cyst.  Antibiotic medicine.  Shots of medicines (steroids) that help to reduce inflammation.  Surgery to remove the cyst. Surgery may be done if the cyst: ? Becomes large. ? Bothers you. ? Has a chance of turning into cancer.  Do not try to open a cyst yourself. Follow these instructions at home:  Take over-the-counter and prescription medicines only as told by your doctor.  If you were prescribed an antibiotic medicine, take it it as told by your doctor. Do not stop using the antibiotic even if you start to feel better.  Keep the area around your cyst clean and dry.  Wear loose, dry clothing.  Avoid touching your cyst.  Check your cyst every day for signs of infection. Check for: ? Redness, swelling, or pain. ? Fluid or blood. ? Warmth. ? Pus or a bad smell.  Keep all follow-up visits as told by your doctor. This is important. How is this prevented?  Wear clean, dry, clothing.  Avoid wearing tight clothing.  Keep your skin clean and dry. Take showers or baths every day. Contact a doctor if:  Your cyst has symptoms of infection.  Your condition does not improve or gets  worse.  You have a cyst that looks different from other cysts you have had.  You have a fever. Get help right away if:  Redness spreads from the cyst into the area close by. Summary  An epidermal cyst is a sac made of skin tissue.  If a cyst becomes infected, treatment may include surgery to open and drain the cyst, or to remove it.  Take over-the-counter and prescription medicines only as told by your doctor.  Contact a doctor if your condition is not improving or is getting worse.  Keep all follow-up visits as told by your doctor. This is important. This information is not intended to replace advice given to you by your health care provider. Make sure you discuss any questions you have with your health care provider. Document Revised: 09/21/2018 Document Reviewed: 03/09/2018 Elsevier Patient Education  2020 [employer].

## 2020-01-01 ENCOUNTER — Ambulatory Visit (INDEPENDENT_AMBULATORY_CARE_PROVIDER_SITE_OTHER): Payer: BC Managed Care – PPO

## 2020-01-01 DIAGNOSIS — J309 Allergic rhinitis, unspecified: Secondary | ICD-10-CM | POA: Diagnosis not present

## 2020-01-29 ENCOUNTER — Ambulatory Visit (INDEPENDENT_AMBULATORY_CARE_PROVIDER_SITE_OTHER): Payer: BC Managed Care – PPO

## 2020-01-29 DIAGNOSIS — J309 Allergic rhinitis, unspecified: Secondary | ICD-10-CM | POA: Diagnosis not present

## 2020-02-13 DIAGNOSIS — F419 Anxiety disorder, unspecified: Secondary | ICD-10-CM | POA: Diagnosis not present

## 2020-02-13 DIAGNOSIS — E785 Hyperlipidemia, unspecified: Secondary | ICD-10-CM | POA: Diagnosis not present

## 2020-02-13 DIAGNOSIS — R799 Abnormal finding of blood chemistry, unspecified: Secondary | ICD-10-CM | POA: Diagnosis not present

## 2020-02-13 DIAGNOSIS — E1165 Type 2 diabetes mellitus with hyperglycemia: Secondary | ICD-10-CM | POA: Diagnosis not present

## 2020-02-13 DIAGNOSIS — E782 Mixed hyperlipidemia: Secondary | ICD-10-CM | POA: Diagnosis not present

## 2020-02-26 ENCOUNTER — Ambulatory Visit (INDEPENDENT_AMBULATORY_CARE_PROVIDER_SITE_OTHER): Payer: BC Managed Care – PPO | Admitting: *Deleted

## 2020-02-26 DIAGNOSIS — J309 Allergic rhinitis, unspecified: Secondary | ICD-10-CM

## 2020-03-04 DIAGNOSIS — J3089 Other allergic rhinitis: Secondary | ICD-10-CM | POA: Diagnosis not present

## 2020-03-04 NOTE — Progress Notes (Signed)
VIALS EXP 03-04-21

## 2020-03-10 DIAGNOSIS — M9902 Segmental and somatic dysfunction of thoracic region: Secondary | ICD-10-CM | POA: Diagnosis not present

## 2020-03-10 DIAGNOSIS — M9903 Segmental and somatic dysfunction of lumbar region: Secondary | ICD-10-CM | POA: Diagnosis not present

## 2020-03-10 DIAGNOSIS — M9901 Segmental and somatic dysfunction of cervical region: Secondary | ICD-10-CM | POA: Diagnosis not present

## 2020-03-10 DIAGNOSIS — M9905 Segmental and somatic dysfunction of pelvic region: Secondary | ICD-10-CM | POA: Diagnosis not present

## 2020-03-12 DIAGNOSIS — M9902 Segmental and somatic dysfunction of thoracic region: Secondary | ICD-10-CM | POA: Diagnosis not present

## 2020-03-12 DIAGNOSIS — M9901 Segmental and somatic dysfunction of cervical region: Secondary | ICD-10-CM | POA: Diagnosis not present

## 2020-03-12 DIAGNOSIS — M9905 Segmental and somatic dysfunction of pelvic region: Secondary | ICD-10-CM | POA: Diagnosis not present

## 2020-03-12 DIAGNOSIS — M9903 Segmental and somatic dysfunction of lumbar region: Secondary | ICD-10-CM | POA: Diagnosis not present

## 2020-03-17 DIAGNOSIS — M9903 Segmental and somatic dysfunction of lumbar region: Secondary | ICD-10-CM | POA: Diagnosis not present

## 2020-03-17 DIAGNOSIS — M9901 Segmental and somatic dysfunction of cervical region: Secondary | ICD-10-CM | POA: Diagnosis not present

## 2020-03-17 DIAGNOSIS — M9905 Segmental and somatic dysfunction of pelvic region: Secondary | ICD-10-CM | POA: Diagnosis not present

## 2020-03-17 DIAGNOSIS — M9902 Segmental and somatic dysfunction of thoracic region: Secondary | ICD-10-CM | POA: Diagnosis not present

## 2020-03-19 DIAGNOSIS — M9902 Segmental and somatic dysfunction of thoracic region: Secondary | ICD-10-CM | POA: Diagnosis not present

## 2020-03-19 DIAGNOSIS — M9901 Segmental and somatic dysfunction of cervical region: Secondary | ICD-10-CM | POA: Diagnosis not present

## 2020-03-19 DIAGNOSIS — M9905 Segmental and somatic dysfunction of pelvic region: Secondary | ICD-10-CM | POA: Diagnosis not present

## 2020-03-19 DIAGNOSIS — M9903 Segmental and somatic dysfunction of lumbar region: Secondary | ICD-10-CM | POA: Diagnosis not present

## 2020-03-24 DIAGNOSIS — G5601 Carpal tunnel syndrome, right upper limb: Secondary | ICD-10-CM | POA: Diagnosis not present

## 2020-03-24 DIAGNOSIS — G5602 Carpal tunnel syndrome, left upper limb: Secondary | ICD-10-CM | POA: Diagnosis not present

## 2020-03-24 DIAGNOSIS — M9901 Segmental and somatic dysfunction of cervical region: Secondary | ICD-10-CM | POA: Diagnosis not present

## 2020-03-24 DIAGNOSIS — M9905 Segmental and somatic dysfunction of pelvic region: Secondary | ICD-10-CM | POA: Diagnosis not present

## 2020-03-24 DIAGNOSIS — M9903 Segmental and somatic dysfunction of lumbar region: Secondary | ICD-10-CM | POA: Diagnosis not present

## 2020-03-24 DIAGNOSIS — M9902 Segmental and somatic dysfunction of thoracic region: Secondary | ICD-10-CM | POA: Diagnosis not present

## 2020-03-25 ENCOUNTER — Ambulatory Visit (INDEPENDENT_AMBULATORY_CARE_PROVIDER_SITE_OTHER): Payer: BC Managed Care – PPO

## 2020-03-25 DIAGNOSIS — J309 Allergic rhinitis, unspecified: Secondary | ICD-10-CM | POA: Diagnosis not present

## 2020-03-26 DIAGNOSIS — M9903 Segmental and somatic dysfunction of lumbar region: Secondary | ICD-10-CM | POA: Diagnosis not present

## 2020-03-26 DIAGNOSIS — M9902 Segmental and somatic dysfunction of thoracic region: Secondary | ICD-10-CM | POA: Diagnosis not present

## 2020-03-26 DIAGNOSIS — M9905 Segmental and somatic dysfunction of pelvic region: Secondary | ICD-10-CM | POA: Diagnosis not present

## 2020-03-26 DIAGNOSIS — M9901 Segmental and somatic dysfunction of cervical region: Secondary | ICD-10-CM | POA: Diagnosis not present

## 2020-04-02 DIAGNOSIS — M9903 Segmental and somatic dysfunction of lumbar region: Secondary | ICD-10-CM | POA: Diagnosis not present

## 2020-04-02 DIAGNOSIS — M9901 Segmental and somatic dysfunction of cervical region: Secondary | ICD-10-CM | POA: Diagnosis not present

## 2020-04-02 DIAGNOSIS — M9905 Segmental and somatic dysfunction of pelvic region: Secondary | ICD-10-CM | POA: Diagnosis not present

## 2020-04-02 DIAGNOSIS — M9902 Segmental and somatic dysfunction of thoracic region: Secondary | ICD-10-CM | POA: Diagnosis not present

## 2020-04-09 DIAGNOSIS — M9905 Segmental and somatic dysfunction of pelvic region: Secondary | ICD-10-CM | POA: Diagnosis not present

## 2020-04-09 DIAGNOSIS — M9901 Segmental and somatic dysfunction of cervical region: Secondary | ICD-10-CM | POA: Diagnosis not present

## 2020-04-09 DIAGNOSIS — M9903 Segmental and somatic dysfunction of lumbar region: Secondary | ICD-10-CM | POA: Diagnosis not present

## 2020-04-09 DIAGNOSIS — M9902 Segmental and somatic dysfunction of thoracic region: Secondary | ICD-10-CM | POA: Diagnosis not present

## 2020-04-16 DIAGNOSIS — Z0000 Encounter for general adult medical examination without abnormal findings: Secondary | ICD-10-CM | POA: Diagnosis not present

## 2020-04-16 DIAGNOSIS — I10 Essential (primary) hypertension: Secondary | ICD-10-CM | POA: Diagnosis not present

## 2020-04-16 DIAGNOSIS — R7303 Prediabetes: Secondary | ICD-10-CM | POA: Diagnosis not present

## 2020-04-16 DIAGNOSIS — E7800 Pure hypercholesterolemia, unspecified: Secondary | ICD-10-CM | POA: Diagnosis not present

## 2020-04-16 DIAGNOSIS — J4520 Mild intermittent asthma, uncomplicated: Secondary | ICD-10-CM | POA: Diagnosis not present

## 2020-04-23 DIAGNOSIS — M9901 Segmental and somatic dysfunction of cervical region: Secondary | ICD-10-CM | POA: Diagnosis not present

## 2020-04-23 DIAGNOSIS — M9902 Segmental and somatic dysfunction of thoracic region: Secondary | ICD-10-CM | POA: Diagnosis not present

## 2020-04-23 DIAGNOSIS — M9905 Segmental and somatic dysfunction of pelvic region: Secondary | ICD-10-CM | POA: Diagnosis not present

## 2020-04-23 DIAGNOSIS — M9903 Segmental and somatic dysfunction of lumbar region: Secondary | ICD-10-CM | POA: Diagnosis not present

## 2020-04-24 ENCOUNTER — Ambulatory Visit (INDEPENDENT_AMBULATORY_CARE_PROVIDER_SITE_OTHER): Payer: BC Managed Care – PPO

## 2020-04-24 DIAGNOSIS — J309 Allergic rhinitis, unspecified: Secondary | ICD-10-CM

## 2020-05-07 DIAGNOSIS — M9903 Segmental and somatic dysfunction of lumbar region: Secondary | ICD-10-CM | POA: Diagnosis not present

## 2020-05-07 DIAGNOSIS — M9901 Segmental and somatic dysfunction of cervical region: Secondary | ICD-10-CM | POA: Diagnosis not present

## 2020-05-07 DIAGNOSIS — M9902 Segmental and somatic dysfunction of thoracic region: Secondary | ICD-10-CM | POA: Diagnosis not present

## 2020-05-07 DIAGNOSIS — M9905 Segmental and somatic dysfunction of pelvic region: Secondary | ICD-10-CM | POA: Diagnosis not present

## 2020-05-20 ENCOUNTER — Ambulatory Visit (INDEPENDENT_AMBULATORY_CARE_PROVIDER_SITE_OTHER): Payer: BC Managed Care – PPO | Admitting: *Deleted

## 2020-05-20 DIAGNOSIS — J309 Allergic rhinitis, unspecified: Secondary | ICD-10-CM | POA: Diagnosis not present

## 2020-05-20 DIAGNOSIS — M9902 Segmental and somatic dysfunction of thoracic region: Secondary | ICD-10-CM | POA: Diagnosis not present

## 2020-05-20 DIAGNOSIS — M9905 Segmental and somatic dysfunction of pelvic region: Secondary | ICD-10-CM | POA: Diagnosis not present

## 2020-05-20 DIAGNOSIS — M9903 Segmental and somatic dysfunction of lumbar region: Secondary | ICD-10-CM | POA: Diagnosis not present

## 2020-05-20 DIAGNOSIS — M9901 Segmental and somatic dysfunction of cervical region: Secondary | ICD-10-CM | POA: Diagnosis not present

## 2020-05-23 DIAGNOSIS — R799 Abnormal finding of blood chemistry, unspecified: Secondary | ICD-10-CM | POA: Diagnosis not present

## 2020-05-23 DIAGNOSIS — U071 COVID-19: Secondary | ICD-10-CM | POA: Diagnosis not present

## 2020-05-23 DIAGNOSIS — E785 Hyperlipidemia, unspecified: Secondary | ICD-10-CM | POA: Diagnosis not present

## 2020-05-23 DIAGNOSIS — E1165 Type 2 diabetes mellitus with hyperglycemia: Secondary | ICD-10-CM | POA: Diagnosis not present

## 2020-05-23 DIAGNOSIS — F419 Anxiety disorder, unspecified: Secondary | ICD-10-CM | POA: Diagnosis not present

## 2020-05-23 DIAGNOSIS — E782 Mixed hyperlipidemia: Secondary | ICD-10-CM | POA: Diagnosis not present

## 2020-05-27 ENCOUNTER — Ambulatory Visit (INDEPENDENT_AMBULATORY_CARE_PROVIDER_SITE_OTHER): Payer: BC Managed Care – PPO | Admitting: *Deleted

## 2020-05-27 DIAGNOSIS — J309 Allergic rhinitis, unspecified: Secondary | ICD-10-CM | POA: Diagnosis not present

## 2020-06-03 ENCOUNTER — Ambulatory Visit (INDEPENDENT_AMBULATORY_CARE_PROVIDER_SITE_OTHER): Payer: BC Managed Care – PPO | Admitting: *Deleted

## 2020-06-03 DIAGNOSIS — J309 Allergic rhinitis, unspecified: Secondary | ICD-10-CM | POA: Diagnosis not present

## 2020-06-04 DIAGNOSIS — M9903 Segmental and somatic dysfunction of lumbar region: Secondary | ICD-10-CM | POA: Diagnosis not present

## 2020-06-04 DIAGNOSIS — M9901 Segmental and somatic dysfunction of cervical region: Secondary | ICD-10-CM | POA: Diagnosis not present

## 2020-06-04 DIAGNOSIS — M9902 Segmental and somatic dysfunction of thoracic region: Secondary | ICD-10-CM | POA: Diagnosis not present

## 2020-06-04 DIAGNOSIS — M9905 Segmental and somatic dysfunction of pelvic region: Secondary | ICD-10-CM | POA: Diagnosis not present

## 2020-06-11 ENCOUNTER — Ambulatory Visit (INDEPENDENT_AMBULATORY_CARE_PROVIDER_SITE_OTHER): Payer: BC Managed Care – PPO

## 2020-06-11 DIAGNOSIS — J309 Allergic rhinitis, unspecified: Secondary | ICD-10-CM | POA: Diagnosis not present

## 2020-06-17 ENCOUNTER — Ambulatory Visit (INDEPENDENT_AMBULATORY_CARE_PROVIDER_SITE_OTHER): Payer: BC Managed Care – PPO | Admitting: *Deleted

## 2020-06-17 DIAGNOSIS — J309 Allergic rhinitis, unspecified: Secondary | ICD-10-CM

## 2020-06-17 DIAGNOSIS — M9901 Segmental and somatic dysfunction of cervical region: Secondary | ICD-10-CM | POA: Diagnosis not present

## 2020-06-17 DIAGNOSIS — M9902 Segmental and somatic dysfunction of thoracic region: Secondary | ICD-10-CM | POA: Diagnosis not present

## 2020-06-17 DIAGNOSIS — M9903 Segmental and somatic dysfunction of lumbar region: Secondary | ICD-10-CM | POA: Diagnosis not present

## 2020-06-17 DIAGNOSIS — M9905 Segmental and somatic dysfunction of pelvic region: Secondary | ICD-10-CM | POA: Diagnosis not present

## 2020-07-01 DIAGNOSIS — M9903 Segmental and somatic dysfunction of lumbar region: Secondary | ICD-10-CM | POA: Diagnosis not present

## 2020-07-01 DIAGNOSIS — M9905 Segmental and somatic dysfunction of pelvic region: Secondary | ICD-10-CM | POA: Diagnosis not present

## 2020-07-01 DIAGNOSIS — M9901 Segmental and somatic dysfunction of cervical region: Secondary | ICD-10-CM | POA: Diagnosis not present

## 2020-07-01 DIAGNOSIS — M9902 Segmental and somatic dysfunction of thoracic region: Secondary | ICD-10-CM | POA: Diagnosis not present

## 2020-07-03 ENCOUNTER — Other Ambulatory Visit (HOSPITAL_COMMUNITY)
Admission: RE | Admit: 2020-07-03 | Discharge: 2020-07-03 | Disposition: A | Discharge status: Home / Self Care | Payer: BC Managed Care – PPO | Source: Ambulatory Visit | Attending: Obstetrics and Gynecology | Admitting: Obstetrics and Gynecology

## 2020-07-03 ENCOUNTER — Encounter: Payer: Self-pay | Admitting: Obstetrics and Gynecology

## 2020-07-03 ENCOUNTER — Other Ambulatory Visit: Payer: Self-pay

## 2020-07-03 ENCOUNTER — Ambulatory Visit: Payer: BC Managed Care – PPO | Admitting: Obstetrics and Gynecology

## 2020-07-03 VITALS — BP 142/74 | HR 80 | Ht 67.25 in | Wt 291.0 lb

## 2020-07-03 DIAGNOSIS — N950 Postmenopausal bleeding: Secondary | ICD-10-CM | POA: Diagnosis not present

## 2020-07-03 DIAGNOSIS — Z124 Encounter for screening for malignant neoplasm of cervix: Secondary | ICD-10-CM | POA: Diagnosis not present

## 2020-07-03 NOTE — Progress Notes (Signed)
GYNECOLOGY  VISIT   HPI: 55 y.o.   Married  Caucasian  female   [phone] with Patient's last menstrual period was 06/14/2016 (approximate).   here for PMB x 6 days.   Had increased bleeding and a clot 2 days ago with bright red blood.   Had some cramping.   FSH 24 and E2 18.5 on 06/28/18.  Not on HRT.   No recent vaginal medications.   GYNECOLOGIC HISTORY: Patient's last menstrual period was 06/14/2016 (approximate). Contraception: PMP-- Menopausal hormone therapy: None Last mammogram:  08-24-19 [facility], normal per patient  Last pap smear: 12-07-18 Neg:Neg HR HPV, 11-07-15 Neg:Neg HR HPv        OB History    Gravida  4   Para  4   Term  4   Preterm      AB      Living  4     SAB      IAB      Ectopic      Multiple      Live Births  4              Patient Active Problem List   Diagnosis Date Noted  . Coughing 11/24/2017  . Globus sensation 11/24/2017  . Laryngopharyngeal reflux (LPR) 11/24/2017  . Allergic reaction 03/02/2017  . Contact dermatitis due to chemicals 09/22/2016  . Allergic rhinitis 09/01/2015  . Mild persistent asthma 09/01/2015  . Food Allergy 09/01/2015  . Cystocele 11/02/2013    Past Medical History:  Diagnosis Date  . Elevated alkaline phosphatase level 10/21/2017   138 - Dr. Geryl Rankins  . Elevated ALT measurement 10/21/2017   62 - Dr. Theador Hawthorne  . Elevated cholesterol 10/21/2017   Dr. Geryl Rankins  . Hashimoto's disease   . Hypertension    asthma related no medication taken  . Plantar fasciitis 2007  . Thigh DVT (deep venous thrombosis) ([place]) 1995    Past Surgical History:  Procedure Laterality Date  . HAND SURGERY Right 2006  . KNEE ARTHROSCOPY Left 04/2010  . LAPAROSCOPY  1991  . SHOULDER ARTHROSCOPY  2004    Current Outpatient Medications  Medication Sig Dispense Refill  . albuterol (PROVENTIL HFA) 108 (90 Base) MCG/ACT inhaler Inhale 2 puffs into the lungs every 4 (four) hours as needed. 18 g 1  . ARNUITY ELLIPTA 100  MCG/ACT AEPB INHALE ONE PUFF INTO LUNGS DAILY 90 each 3  . buPROPion (WELLBUTRIN XL) 150 MG 24 hr tablet TK 1 T PO QD    . EPINEPHrine 0.3 mg/0.3 mL IJ SOAJ injection SMARTSIG:0.3 Milliliter(s) IM Once    . losartan (COZAAR) 50 MG tablet TK 1 T PO  BID  5  . Magnesium (OPTIMAG 125 PO) Take by mouth.    . modafinil (PROVIGIL) 100 MG tablet Take 100 mg by mouth daily.    Marland Kitchen UNABLE TO FIND 5HTP CR 159m 1 tab twice daily    . UNABLE TO FIND Low dose naltrexone 4.554m1 at bedtime    . Vitamin A 2250 MCG (7500 UT) CAPS Take by mouth.    . Vitamin D, Ergocalciferol, (DRISDOL) 50000 units CAPS capsule Take 50,000 Units by mouth. Every 6 days     No current facility-administered medications for this visit.     ALLERGIES: Beef-derived products, Cat hair extract, Fish allergy, Lambs quarters, Other, Peanut oil, Pork-derived products, Shellfish allergy, Bee venom, Latex, and Pollen extract  Family History  Problem Relation Age of Onset  . Arthritis Mother   .  Hypertension Mother   . Diabetes Mother   . Allergic rhinitis Neg Hx   . Angioedema Neg Hx   . Eczema Neg Hx   . Asthma Neg Hx   . Immunodeficiency Neg Hx   . Urticaria Neg Hx     Social History   Socioeconomic History  . Marital status: Married    Spouse name: Not on file  . Number of children: Not on file  . Years of education: Not on file  . Highest education level: Not on file  Occupational History  . Not on file  Tobacco Use  . Smoking status: Never Smoker  . Smokeless tobacco: Never Used  Vaping Use  . Vaping Use: Never used  Substance and Sexual Activity  . Alcohol use: Yes    Alcohol/week: 0.0 - 1.0 standard drinks  . Drug use: No  . Sexual activity: Yes    Partners: Male    Birth control/protection: Post-menopausal  Other Topics Concern  . Not on file  Social History Narrative  . Not on file   Social Determinants of Health   Financial Resource Strain: Not on file  Food Insecurity: Not on file   Transportation Needs: Not on file  Physical Activity: Not on file  Stress: Not on file  Social Connections: Not on file  Intimate Partner Violence: Not on file    Review of Systems  All other systems reviewed and are negative.   PHYSICAL EXAMINATION:    BP (!) 142/74 (Cuff Size: Large)   Pulse 80   Ht 5' 7.25" (1.708 m)   Wt 291 lb (132 kg)   LMP 06/14/2016 (Approximate)   SpO2 98%   BMI 45.24 kg/m     General appearance: alert, cooperative and appears stated age   Inguinal nodes:  Normal.   Pelvic: External genitalia:  no lesions              Urethra:  normal appearing urethra with no masses, tenderness or lesions              Bartholins and Skenes: normal                 Vagina: normal appearing vagina with normal color and discharge, no lesions              Cervix: no lesions                Bimanual Exam:  Uterus:  normal size, contour, position, consistency, mobility, non-tender              Adnexa: no mass, fullness, tenderness              Rectal exam: Yes.  .  Confirms.              Anus:  normal sphincter tone, no lesions  Chaperone was present for exam.  ASSESSMENT  Postmenopausal bleeding.   PLAN  Discussion of postmenopausal bleeding and potential etiologies - atrophy, polyp, infection, hyperplasia, and malignancy.  Pap and HR HPV testing.  Return for pelvic ultrasound and EMB.  Rationale and procedures explained.   20 min  total time was spent for this patient encounter, including preparation, face-to-face counseling with the patient, coordination of care, and documentation of the encounter.

## 2020-07-03 NOTE — Patient Instructions (Signed)
Postmenopausal Bleeding Postmenopausal bleeding is any bleeding that a woman has after she has entered menopause. Menopause is the end of a woman's fertile years. After menopause, a woman no longer ovulates and does not have menstrual periods. Therefore, she should no longer have bleeding from her vagina. Postmenopausal bleeding may have various causes, including:  Menopausal hormone therapy (MHT).  Endometrial atrophy. After menopause, low estrogen hormone levels cause the membrane that lines the uterus (endometrium) to become thin. You may have bleeding as the endometrium thins.  Endometrial hyperplasia. This condition is caused by excess estrogen hormones and low levels of progesterone hormones. The excess estrogen causes the endometrium to thicken, which can lead to bleeding. In some cases, this can lead to cancer of the uterus.  Endometrial cancer.  Noncancerous growths (polyps) on the endometrium, the lining of the uterus, or the cervix.  Uterine fibroids. These are noncancerous growths in or around the uterus muscle tissue that can cause heavy bleeding. Any type of postmenopausal bleeding, even if it appears to be a typical menstrual period, should be checked by your health care provider. Treatment will depend on the cause of the bleeding. Follow these instructions at home:  Pay attention to any changes in your symptoms. Let your health care provider know about them.  Avoid using tampons and douches as told by your health care provider.  Change your pads regularly.  Get regular pelvic exams, including Pap tests, as told by your health care provider.  Take iron supplements as told by your health care provider.  Take over-the-counter and prescription medicines only as told by your health care provider.  Keep all follow-up visits. This is important.   Contact a health care provider if:  You have new bleeding from the vagina after menopause.  You have pain in your abdomen. Get  help right away if:  You have a fever or chills.  You have severe pain with bleeding.  You are passing blood clots.  You have heavy bleeding, need more than 1 pad an hour, and have never experienced this before.  You have headaches or feel faint or dizzy. Summary  Postmenopausal bleeding is any bleeding that a woman has after she has entered into menopause.  Postmenopausal bleeding may have various causes. Treatment will depend on the cause of the bleeding.  Any type of postmenopausal bleeding, even if it appears to be a typical menstrual period, should be checked by your health care provider.  Be sure to pay attention to any changes in your symptoms and keep all follow-up visits. This information is not intended to replace advice given to you by your health care provider. Make sure you discuss any questions you have with your health care provider. Document Revised: 11/15/2019 Document Reviewed: 11/15/2019 Elsevier Patient Education  2021 Else[employer] Inc.  

## 2020-07-04 LAB — CYTOLOGY - PAP
Comment: NEGATIVE
Diagnosis: NEGATIVE
High risk HPV: NEGATIVE

## 2020-07-15 ENCOUNTER — Ambulatory Visit (INDEPENDENT_AMBULATORY_CARE_PROVIDER_SITE_OTHER): Payer: BC Managed Care – PPO

## 2020-07-15 DIAGNOSIS — M9901 Segmental and somatic dysfunction of cervical region: Secondary | ICD-10-CM | POA: Diagnosis not present

## 2020-07-15 DIAGNOSIS — J309 Allergic rhinitis, unspecified: Secondary | ICD-10-CM | POA: Diagnosis not present

## 2020-07-15 DIAGNOSIS — M9905 Segmental and somatic dysfunction of pelvic region: Secondary | ICD-10-CM | POA: Diagnosis not present

## 2020-07-15 DIAGNOSIS — M9903 Segmental and somatic dysfunction of lumbar region: Secondary | ICD-10-CM | POA: Diagnosis not present

## 2020-07-15 DIAGNOSIS — M9902 Segmental and somatic dysfunction of thoracic region: Secondary | ICD-10-CM | POA: Diagnosis not present

## 2020-07-24 ENCOUNTER — Other Ambulatory Visit: Payer: Self-pay

## 2020-07-24 ENCOUNTER — Ambulatory Visit: Payer: BC Managed Care – PPO | Admitting: Obstetrics and Gynecology

## 2020-07-24 ENCOUNTER — Ambulatory Visit (INDEPENDENT_AMBULATORY_CARE_PROVIDER_SITE_OTHER): Payer: BC Managed Care – PPO

## 2020-07-24 ENCOUNTER — Other Ambulatory Visit (HOSPITAL_COMMUNITY)
Admission: RE | Admit: 2020-07-24 | Discharge: 2020-07-24 | Disposition: A | Discharge status: Home / Self Care | Payer: BC Managed Care – PPO | Source: Ambulatory Visit | Attending: Obstetrics and Gynecology | Admitting: Obstetrics and Gynecology

## 2020-07-24 ENCOUNTER — Encounter: Payer: Self-pay | Admitting: Obstetrics and Gynecology

## 2020-07-24 VITALS — BP 140/82 | HR 78 | Ht 67.25 in | Wt 291.0 lb

## 2020-07-24 DIAGNOSIS — R9389 Abnormal findings on diagnostic imaging of other specified body structures: Secondary | ICD-10-CM | POA: Diagnosis not present

## 2020-07-24 DIAGNOSIS — N950 Postmenopausal bleeding: Secondary | ICD-10-CM | POA: Diagnosis not present

## 2020-07-24 DIAGNOSIS — N888 Other specified noninflammatory disorders of cervix uteri: Secondary | ICD-10-CM | POA: Diagnosis not present

## 2020-07-24 DIAGNOSIS — N859 Noninflammatory disorder of uterus, unspecified: Secondary | ICD-10-CM | POA: Diagnosis not present

## 2020-07-24 NOTE — Patient Instructions (Signed)
Endometrial Biopsy  An endometrial biopsy is a procedure to remove tissue samples from the endometrium, which is the lining of the uterus. The tissue that is removed can then be checked under a microscope for disease. This procedure is used to diagnose conditions such as endometrial cancer, endometrial tuberculosis, polyps, or other inflammatory conditions. This procedure may also be used to investigate uterine bleeding to determine where you are in your menstrual cycle or how your hormone levels are affecting the lining of the uterus. Tell a health care provider about:  Any allergies you have.  All medicines you are taking, including vitamins, herbs, eye drops, creams, and over-the-counter medicines.  Any problems you or family members have had with anesthetic medicines.  Any blood disorders you have.  Any surgeries you have had.  Any medical conditions you have.  Whether you are pregnant or may be pregnant. What are the risks? Generally, this is a safe procedure. However, problems may occur, including:  Bleeding.  Pelvic infection.  Puncture of the wall of the uterus with the biopsy device (rare).  Allergic reactions to medicines. What happens before the procedure?  Keep a record of your menstrual cycles as told by your health care provider. You may need to schedule your procedure for a specific time in your cycle.  You may want to bring a sanitary pad to wear after the procedure.  Plan to have someone take you home from the hospital or clinic.  Ask your health care provider about: ? Changing or stopping your regular medicines. This is especially important if you are taking diabetes medicines, arthritis medicines, or blood thinners. ? Taking medicines such as aspirin and ibuprofen. These medicines can thin your blood. Do not take these medicines unless your health care provider tells you to take them. ? Taking over-the-counter medicines, vitamins, herbs, and  supplements. What happens during the procedure?  You will lie on an exam table with your feet and legs supported as in a pelvic exam.  Your health care provider will insert an instrument (speculum) into your vagina to see your cervix.  Your cervix will be cleansed with an antiseptic solution.  A medicine (local anesthetic) will be used to numb the cervix.  A forceps instrument (tenaculum) will be used to hold your cervix steady for the biopsy.  A thin, rod-like instrument (uterine sound) will be inserted through your cervix to determine the length of your uterus and the location where the biopsy sample will be removed.  A thin, flexible tube (catheter) will be inserted through your cervix and into the uterus. The catheter will be used to collect the biopsy sample from your endometrial tissue.  The catheter and speculum will then be removed, and the tissue sample will be sent to a lab for examination. The procedure may vary among health care providers and hospitals. What can I expect after procedure?  You will rest in a recovery area until you are ready to go home.  You may have mild cramping and a small amount of vaginal bleeding. This is normal.  You may have a small amount of vaginal bleeding for a few days. This is normal.  It is up to you to get the results of your procedure. Ask your health care provider, or the department that is doing the procedure, when your results will be ready. Follow these instructions at home:  Take over-the-counter and prescription medicines only as told by your health care provider.  Do not douche, use tampons, or have   sexual intercourse until your health care provider approves.  Return to your normal activities as told by your health care provider. Ask your health care provider what activities are safe for you.  Follow instructions from your health care provider about any activity restrictions, such as restrictions on strenuous exercise or heavy  lifting.  Keep all follow-up visits. This is important. Contact a health care provider:  You have heavy bleeding, or bleed for longer than 2 days after the procedure.  You have bad smelling discharge from your vagina.  You have a fever or chills.  You have a burning sensation when urinating or you have difficulty urinating.  You have severe pain in your lower abdomen. Get help right away if you:  You have severe cramps in your stomach or back.  You pass large blood clots.  Your bleeding increases.  You become weak or light-headed, or you faint or lose consciousness. Summary  An endometrial biopsy is a procedure to remove tissue samples is taken from the endometrium, which is the lining of the uterus.  The tissue sample that is removed will be checked under a microscope for disease.  This procedure is used to diagnose conditions such as endometrial cancer, endometrial tuberculosis, polyps, or other inflammatory conditions.  After the procedure, it is common to have mild cramping and a small amount of vaginal bleeding for a few days.  Do not douche, use tampons, or have sexual intercourse until your health care provider approves. Ask your health care provider which activities are safe for you. This information is not intended to replace advice given to you by your health care provider. Make sure you discuss any questions you have with your health care provider. Document Revised: 12/24/2019 Document Reviewed: 12/24/2019 Elsevier Patient Education  2021 [employer] Inc[employer]  

## 2020-07-24 NOTE — Progress Notes (Signed)
GYNECOLOGY  VISIT   HPI: 55 y.o.   Married  Caucasian  female   [phone] with Patient's last menstrual period was 06/14/2016 (approximate).   here for pelvic ultrasound and EMB for postmenopausal bleeding in January 2022.   Has some bleeding again one week ago.  No pain currently, but did have period cramping with her initial bleeding.   FSH 24 and E2 18.5 on 06/28/18.  GYNECOLOGIC HISTORY: Patient's last menstrual period was 06/14/2016 (approximate). Contraception:  PMP Menopausal hormone therapy:  none Last mammogram:  08-24-19 [facility], normal per patient Last pap smear: 07/03/20:  Neg/HR HPV neg, 12-07-18 Neg:Neg HR HPV, 11-07-15 Neg:Neg HR HPv        OB History    Gravida  4   Para  4   Term  4   Preterm      AB      Living  4     SAB      IAB      Ectopic      Multiple      Live Births  4              Patient Active Problem List   Diagnosis Date Noted  . Coughing 11/24/2017  . Globus sensation 11/24/2017  . Laryngopharyngeal reflux (LPR) 11/24/2017  . Allergic reaction 03/02/2017  . Contact dermatitis due to chemicals 09/22/2016  . Allergic rhinitis 09/01/2015  . Mild persistent asthma 09/01/2015  . Food Allergy 09/01/2015  . Cystocele 11/02/2013    Past Medical History:  Diagnosis Date  . Elevated alkaline phosphatase level 10/21/2017   138 - Dr. Geryl Rankins  . Elevated ALT measurement 10/21/2017   62 - Dr. Theador Hawthorne  . Elevated cholesterol 10/21/2017   Dr. Geryl Rankins  . Hashimoto's disease   . Hypertension    asthma related no medication taken  . Plantar fasciitis 2007  . Thigh DVT (deep venous thrombosis) ([place]) 1995    Past Surgical History:  Procedure Laterality Date  . HAND SURGERY Right 2006  . KNEE ARTHROSCOPY Left 04/2010  . LAPAROSCOPY  1991  . SHOULDER ARTHROSCOPY  2004    Current Outpatient Medications  Medication Sig Dispense Refill  . albuterol (PROVENTIL HFA) 108 (90 Base) MCG/ACT inhaler Inhale 2 puffs into the lungs  every 4 (four) hours as needed. 18 g 1  . ARNUITY ELLIPTA 100 MCG/ACT AEPB INHALE ONE PUFF INTO LUNGS DAILY 90 each 3  . buPROPion (WELLBUTRIN XL) 300 MG 24 hr tablet Take 300 mg by mouth daily.    Marland Kitchen EPINEPHrine 0.3 mg/0.3 mL IJ SOAJ injection SMARTSIG:0.3 Milliliter(s) IM Once    . losartan (COZAAR) 50 MG tablet TK 1 T PO  BID  5  . Magnesium (OPTIMAG 125 PO) Take by mouth.    . modafinil (PROVIGIL) 100 MG tablet Take 100 mg by mouth daily.    Marland Kitchen UNABLE TO FIND 5HTP CR 162m 1 tab twice daily    . UNABLE TO FIND Low dose naltrexone 4.52m1 at bedtime    . Vitamin A 2250 MCG (7500 UT) CAPS Take by mouth.    . Vitamin D, Ergocalciferol, (DRISDOL) 50000 units CAPS capsule Take 50,000 Units by mouth. Every 6 days     No current facility-administered medications for this visit.     ALLERGIES: Beef-derived products, Cat hair extract, Fish allergy, Lambs quarters, Other, Peanut oil, Pork-derived products, Shellfish allergy, Bee venom, Latex, and Pollen extract  Family History  Problem Relation Age of Onset  .  Arthritis Mother   . Hypertension Mother   . Diabetes Mother   . Allergic rhinitis Neg Hx   . Angioedema Neg Hx   . Eczema Neg Hx   . Asthma Neg Hx   . Immunodeficiency Neg Hx   . Urticaria Neg Hx     Social History   Socioeconomic History  . Marital status: Married    Spouse name: Not on file  . Number of children: Not on file  . Years of education: Not on file  . Highest education level: Not on file  Occupational History  . Not on file  Tobacco Use  . Smoking status: Never Smoker  . Smokeless tobacco: Never Used  Vaping Use  . Vaping Use: Never used  Substance and Sexual Activity  . Alcohol use: Yes    Alcohol/week: 0.0 - 1.0 standard drinks  . Drug use: No  . Sexual activity: Yes    Partners: Male    Birth control/protection: Post-menopausal  Other Topics Concern  . Not on file  Social History Narrative  . Not on file   Social Determinants of Health    Financial Resource Strain: Not on file  Food Insecurity: Not on file  Transportation Needs: Not on file  Physical Activity: Not on file  Stress: Not on file  Social Connections: Not on file  Intimate Partner Violence: Not on file    Review of Systems  All other systems reviewed and are negative.   PHYSICAL EXAMINATION:    BP 140/82 (Cuff Size: Large)   Pulse 78   Ht 5' 7.25" (1.708 m)   Wt 291 lb (132 kg)   LMP 06/14/2016 (Approximate)   SpO2 98%   BMI 45.24 kg/m     General appearance: alert, cooperative and appears stated age   Pelvic US  Uterus with no myometrial masses.  EMS 8.11 mm.  Ovaries atrophic.  No adnexal masses.  No free fluid.  Endometrial biopsy.  Consent for procedure. Small amount of dark clot in vagina.  Sterile prep with Hibiclens.  Paracervical block with 10 cc 1% lidocaine, lot [id number], exp 12/2023. Pipelle passed to 7.5 cm x 2.  Tissue to pathology.  No complications.  Minimal EBL.  Chaperone was present for exam.  ASSESSMENT  Postmenopausal bleeding.  Thickened endometrium.  Hx DVT.   PLAN  Pelvic US images and findings reviewed.  FU EMB.  Post biopsy precautions reviewed.  Final recommendations to follow.  Fu prn.

## 2020-07-28 DIAGNOSIS — M9902 Segmental and somatic dysfunction of thoracic region: Secondary | ICD-10-CM | POA: Diagnosis not present

## 2020-07-28 DIAGNOSIS — M9901 Segmental and somatic dysfunction of cervical region: Secondary | ICD-10-CM | POA: Diagnosis not present

## 2020-07-28 DIAGNOSIS — M9905 Segmental and somatic dysfunction of pelvic region: Secondary | ICD-10-CM | POA: Diagnosis not present

## 2020-07-28 DIAGNOSIS — M9903 Segmental and somatic dysfunction of lumbar region: Secondary | ICD-10-CM | POA: Diagnosis not present

## 2020-07-29 LAB — SURGICAL PATHOLOGY

## 2020-08-04 ENCOUNTER — Other Ambulatory Visit: Payer: Self-pay | Admitting: Obstetrics and Gynecology

## 2020-08-04 DIAGNOSIS — R9389 Abnormal findings on diagnostic imaging of other specified body structures: Secondary | ICD-10-CM

## 2020-08-04 DIAGNOSIS — N950 Postmenopausal bleeding: Secondary | ICD-10-CM

## 2020-08-12 ENCOUNTER — Ambulatory Visit (INDEPENDENT_AMBULATORY_CARE_PROVIDER_SITE_OTHER): Payer: BC Managed Care – PPO | Admitting: *Deleted

## 2020-08-12 DIAGNOSIS — M9905 Segmental and somatic dysfunction of pelvic region: Secondary | ICD-10-CM | POA: Diagnosis not present

## 2020-08-12 DIAGNOSIS — M9901 Segmental and somatic dysfunction of cervical region: Secondary | ICD-10-CM | POA: Diagnosis not present

## 2020-08-12 DIAGNOSIS — M9902 Segmental and somatic dysfunction of thoracic region: Secondary | ICD-10-CM | POA: Diagnosis not present

## 2020-08-12 DIAGNOSIS — J309 Allergic rhinitis, unspecified: Secondary | ICD-10-CM | POA: Diagnosis not present

## 2020-08-12 DIAGNOSIS — M9903 Segmental and somatic dysfunction of lumbar region: Secondary | ICD-10-CM | POA: Diagnosis not present

## 2020-08-26 DIAGNOSIS — M9905 Segmental and somatic dysfunction of pelvic region: Secondary | ICD-10-CM | POA: Diagnosis not present

## 2020-08-26 DIAGNOSIS — M9903 Segmental and somatic dysfunction of lumbar region: Secondary | ICD-10-CM | POA: Diagnosis not present

## 2020-08-26 DIAGNOSIS — M9902 Segmental and somatic dysfunction of thoracic region: Secondary | ICD-10-CM | POA: Diagnosis not present

## 2020-08-26 DIAGNOSIS — M9901 Segmental and somatic dysfunction of cervical region: Secondary | ICD-10-CM | POA: Diagnosis not present

## 2020-08-28 DIAGNOSIS — G47419 Narcolepsy without cataplexy: Secondary | ICD-10-CM | POA: Diagnosis not present

## 2020-08-28 DIAGNOSIS — E782 Mixed hyperlipidemia: Secondary | ICD-10-CM | POA: Diagnosis not present

## 2020-08-28 DIAGNOSIS — R946 Abnormal results of thyroid function studies: Secondary | ICD-10-CM | POA: Diagnosis not present

## 2020-08-28 DIAGNOSIS — F419 Anxiety disorder, unspecified: Secondary | ICD-10-CM | POA: Diagnosis not present

## 2020-08-29 DIAGNOSIS — Z1231 Encounter for screening mammogram for malignant neoplasm of breast: Secondary | ICD-10-CM | POA: Diagnosis not present

## 2020-09-01 ENCOUNTER — Other Ambulatory Visit (HOSPITAL_COMMUNITY)
Admission: RE | Admit: 2020-09-01 | Discharge: 2020-09-01 | Disposition: A | Discharge status: Home / Self Care | Payer: BC Managed Care – PPO | Source: Ambulatory Visit | Attending: Obstetrics and Gynecology | Admitting: Obstetrics and Gynecology

## 2020-09-01 ENCOUNTER — Ambulatory Visit: Payer: BC Managed Care – PPO | Admitting: Obstetrics and Gynecology

## 2020-09-01 ENCOUNTER — Other Ambulatory Visit: Payer: Self-pay

## 2020-09-01 ENCOUNTER — Encounter: Payer: Self-pay | Admitting: Obstetrics and Gynecology

## 2020-09-01 DIAGNOSIS — R9389 Abnormal findings on diagnostic imaging of other specified body structures: Secondary | ICD-10-CM | POA: Diagnosis not present

## 2020-09-01 DIAGNOSIS — N950 Postmenopausal bleeding: Secondary | ICD-10-CM

## 2020-09-01 DIAGNOSIS — N840 Polyp of corpus uteri: Secondary | ICD-10-CM | POA: Diagnosis not present

## 2020-09-01 NOTE — Patient Instructions (Signed)
Endometrial Biopsy  An endometrial biopsy is a procedure to remove tissue samples from the endometrium, which is the lining of the uterus. The tissue that is removed can then be checked under a microscope for disease. This procedure is used to diagnose conditions such as endometrial cancer, endometrial tuberculosis, polyps, or other inflammatory conditions. This procedure may also be used to investigate uterine bleeding to determine where you are in your menstrual cycle or how your hormone levels are affecting the lining of the uterus. Tell a health care provider about:  Any allergies you have.  All medicines you are taking, including vitamins, herbs, eye drops, creams, and over-the-counter medicines.  Any problems you or family members have had with anesthetic medicines.  Any blood disorders you have.  Any surgeries you have had.  Any medical conditions you have.  Whether you are pregnant or may be pregnant. What are the risks? Generally, this is a safe procedure. However, problems may occur, including:  Bleeding.  Pelvic infection.  Puncture of the wall of the uterus with the biopsy device (rare).  Allergic reactions to medicines. What happens before the procedure?  Keep a record of your menstrual cycles as told by your health care provider. You may need to schedule your procedure for a specific time in your cycle.  You may want to bring a sanitary pad to wear after the procedure.  Plan to have someone take you home from the hospital or clinic.  Ask your health care provider about: ? Changing or stopping your regular medicines. This is especially important if you are taking diabetes medicines, arthritis medicines, or blood thinners. ? Taking medicines such as aspirin and ibuprofen. These medicines can thin your blood. Do not take these medicines unless your health care provider tells you to take them. ? Taking over-the-counter medicines, vitamins, herbs, and  supplements. What happens during the procedure?  You will lie on an exam table with your feet and legs supported as in a pelvic exam.  Your health care provider will insert an instrument (speculum) into your vagina to see your cervix.  Your cervix will be cleansed with an antiseptic solution.  A medicine (local anesthetic) will be used to numb the cervix.  A forceps instrument (tenaculum) will be used to hold your cervix steady for the biopsy.  A thin, rod-like instrument (uterine sound) will be inserted through your cervix to determine the length of your uterus and the location where the biopsy sample will be removed.  A thin, flexible tube (catheter) will be inserted through your cervix and into the uterus. The catheter will be used to collect the biopsy sample from your endometrial tissue.  The catheter and speculum will then be removed, and the tissue sample will be sent to a lab for examination. The procedure may vary among health care providers and hospitals. What can I expect after procedure?  You will rest in a recovery area until you are ready to go home.  You may have mild cramping and a small amount of vaginal bleeding. This is normal.  You may have a small amount of vaginal bleeding for a few days. This is normal.  It is up to you to get the results of your procedure. Ask your health care provider, or the department that is doing the procedure, when your results will be ready. Follow these instructions at home:  Take over-the-counter and prescription medicines only as told by your health care provider.  Do not douche, use tampons, or have   sexual intercourse until your health care provider approves.  Return to your normal activities as told by your health care provider. Ask your health care provider what activities are safe for you.  Follow instructions from your health care provider about any activity restrictions, such as restrictions on strenuous exercise or heavy  lifting.  Keep all follow-up visits. This is important. Contact a health care provider:  You have heavy bleeding, or bleed for longer than 2 days after the procedure.  You have bad smelling discharge from your vagina.  You have a fever or chills.  You have a burning sensation when urinating or you have difficulty urinating.  You have severe pain in your lower abdomen. Get help right away if you:  You have severe cramps in your stomach or back.  You pass large blood clots.  Your bleeding increases.  You become weak or light-headed, or you faint or lose consciousness. Summary  An endometrial biopsy is a procedure to remove tissue samples is taken from the endometrium, which is the lining of the uterus.  The tissue sample that is removed will be checked under a microscope for disease.  This procedure is used to diagnose conditions such as endometrial cancer, endometrial tuberculosis, polyps, or other inflammatory conditions.  After the procedure, it is common to have mild cramping and a small amount of vaginal bleeding for a few days.  Do not douche, use tampons, or have sexual intercourse until your health care provider approves. Ask your health care provider which activities are safe for you. This information is not intended to replace advice given to you by your health care provider. Make sure you discuss any questions you have with your health care provider. Document Revised: 12/24/2019 Document Reviewed: 12/24/2019 Elsevier Patient Education  2021 [employer] Inc[employer]  

## 2020-09-01 NOTE — Progress Notes (Signed)
GYNECOLOGY  VISIT   HPI: 55 y.o.   Married  Caucasian  female   [phone] with Patient's last menstrual period was 06/14/2016 (approximate).   here for repeat EMB and ECC for inadequate specimen. Indication for the procedure is postmenopausal bleeding.   Bleeding off and on with brownish discharge. Very light.   [place] thickened endometrium to 8.11 mm.  EMB predominately endocervical glandular tissue with microglandular adenosis.  Atypical squamous epithelium, possible LGSIL.  GYNECOLOGIC HISTORY: Patient's last menstrual period was 06/14/2016 (approximate). Contraception:  PMP Menopausal hormone therapy:  none Last mammogram: 08-29-20 pend.per patient--[facility] Last pap smear: 07/03/20 Neg:HR HPV neg, 12-07-18 Neg:Neg HR HPV, 11-07-15 Neg:Neg HR HPV        OB History    Gravida  4   Para  4   Term  4   Preterm      AB      Living  4     SAB      IAB      Ectopic      Multiple      Live Births  4              Patient Active Problem List   Diagnosis Date Noted  . Coughing 11/24/2017  . Globus sensation 11/24/2017  . Laryngopharyngeal reflux (LPR) 11/24/2017  . Allergic reaction 03/02/2017  . Contact dermatitis due to chemicals 09/22/2016  . Allergic rhinitis 09/01/2015  . Mild persistent asthma 09/01/2015  . Food Allergy 09/01/2015  . Cystocele 11/02/2013    Past Medical History:  Diagnosis Date  . Elevated alkaline phosphatase level 10/21/2017   138 - Dr. Geryl Rankins  . Elevated ALT measurement 10/21/2017   62 - Dr. Theador Hawthorne  . Elevated cholesterol 10/21/2017   Dr. Geryl Rankins  . Hashimoto's disease   . Hypertension    asthma related no medication taken  . Plantar fasciitis 2007  . Thigh DVT (deep venous thrombosis) ([place]) 1995    Past Surgical History:  Procedure Laterality Date  . HAND SURGERY Right 2006  . KNEE ARTHROSCOPY Left 04/2010  . LAPAROSCOPY  1991  . SHOULDER ARTHROSCOPY  2004    Current Outpatient Medications  Medication Sig Dispense  Refill  . albuterol (PROVENTIL HFA) 108 (90 Base) MCG/ACT inhaler Inhale 2 puffs into the lungs every 4 (four) hours as needed. 18 g 1  . ARNUITY ELLIPTA 100 MCG/ACT AEPB INHALE ONE PUFF INTO LUNGS DAILY 90 each 3  . buPROPion (WELLBUTRIN XL) 300 MG 24 hr tablet Take 300 mg by mouth daily.    Marland Kitchen EPINEPHrine 0.3 mg/0.3 mL IJ SOAJ injection SMARTSIG:0.3 Milliliter(s) IM Once    . losartan (COZAAR) 50 MG tablet TK 1 T PO  BID  5  . Magnesium (OPTIMAG 125 PO) Take by mouth.    . modafinil (PROVIGIL) 100 MG tablet Take 100 mg by mouth daily.    Marland Kitchen UNABLE TO FIND 5HTP CR 150m 1 tab twice daily    . UNABLE TO FIND Low dose naltrexone 4.539m1 at bedtime    . Vitamin A 2250 MCG (7500 UT) CAPS Take by mouth.    . Vitamin D, Ergocalciferol, (DRISDOL) 50000 units CAPS capsule Take 50,000 Units by mouth. Every 6 days     No current facility-administered medications for this visit.     ALLERGIES: Beef-derived products, Cat hair extract, Fish allergy, Lambs quarters, Other, Peanut oil, Pork-derived products, Shellfish allergy, Bee venom, Latex, and Pollen extract  Family History  Problem Relation  Age of Onset  . Arthritis Mother   . Hypertension Mother   . Diabetes Mother   . Allergic rhinitis Neg Hx   . Angioedema Neg Hx   . Eczema Neg Hx   . Asthma Neg Hx   . Immunodeficiency Neg Hx   . Urticaria Neg Hx     Social History   Socioeconomic History  . Marital status: Married    Spouse name: Not on file  . Number of children: Not on file  . Years of education: Not on file  . Highest education level: Not on file  Occupational History  . Not on file  Tobacco Use  . Smoking status: Never Smoker  . Smokeless tobacco: Never Used  Vaping Use  . Vaping Use: Never used  Substance and Sexual Activity  . Alcohol use: Yes    Alcohol/week: 0.0 - 1.0 standard drinks  . Drug use: No  . Sexual activity: Yes    Partners: Male    Birth control/protection: Post-menopausal  Other Topics Concern  .  Not on file  Social History Narrative  . Not on file   Social Determinants of Health   Financial Resource Strain: Not on file  Food Insecurity: Not on file  Transportation Needs: Not on file  Physical Activity: Not on file  Stress: Not on file  Social Connections: Not on file  Intimate Partner Violence: Not on file    Review of Systems  All other systems reviewed and are negative.   PHYSICAL EXAMINATION:    BP 134/72 (Cuff Size: Large)   Pulse 73   Ht 5' 7.25" (1.708 m)   Wt 285 lb (129.3 kg)   LMP 06/14/2016 (Approximate)   SpO2 95%   BMI 44.31 kg/m     General appearance: alert, cooperative and appears stated age   Consent for procedure. Sterile prep with Hibiclens.  Paracervical block 10 cc 1% lidocaine, lot [id number], exp 12/2023.  Tenaculum to anterior cervical lip. ECC done. Tissue to pathology. EMB to 8 cm x 2 with Pipelle.  Tissue to pathology.  Minimal EBL.  No complications.   Chaperone was present for exam.  ASSESSMENT  Postmenopausal bleeding.  Thickened endometrium.  PLAN  FU ECC and endometrial biopsy.  If polyp is detected, will offer sonohysterogram versus hysteroscopy, Myosure removal of polyp, and dilation and curettage.

## 2020-09-03 LAB — SURGICAL PATHOLOGY

## 2020-09-04 ENCOUNTER — Telehealth: Payer: Self-pay

## 2020-09-04 NOTE — Telephone Encounter (Signed)
Patient saw pathology result and was inquiring if Dr. Quincy Simmonds had viewed it yet.  Also, wanted Dr. Quincy Simmonds to know that she stopped spotting on Monday after having had the biopsy. However, today she is having some red light bleeding. Not like a period. Very light.

## 2020-09-05 NOTE — Telephone Encounter (Signed)
Patient informed with below note, patient said she will call appointments to schedule visit.

## 2020-09-05 NOTE — Telephone Encounter (Signed)
Patient's pathology report does show a benign endometrial polyp.  Due to her ongoing postmenopausal bleeding in the presence of a polyp, I do recommend proceeding with an outpatient surgery - hysteroscopy with Myosure resection of endometrial polyp, dilation and curettage.   Please schedule an office visit with me to discuss further.   OK to proceed with precert and scheduling surgery if she is comfortable with this plan.

## 2020-09-08 NOTE — Telephone Encounter (Signed)
Reviewed with Kelvin Cellar. Patient request OV before proceeding with surgery scheduling.  Per review of Epic, patient has an OV scheduled for 09/16/20 at 1130 to further discuss.   Will wait for update after consult.   Routing to Dr. Antony Blackbird.   Will close this encounter.

## 2020-09-09 ENCOUNTER — Ambulatory Visit (INDEPENDENT_AMBULATORY_CARE_PROVIDER_SITE_OTHER): Payer: BC Managed Care – PPO | Admitting: *Deleted

## 2020-09-09 DIAGNOSIS — J309 Allergic rhinitis, unspecified: Secondary | ICD-10-CM | POA: Diagnosis not present

## 2020-09-09 DIAGNOSIS — M9903 Segmental and somatic dysfunction of lumbar region: Secondary | ICD-10-CM | POA: Diagnosis not present

## 2020-09-09 DIAGNOSIS — M9905 Segmental and somatic dysfunction of pelvic region: Secondary | ICD-10-CM | POA: Diagnosis not present

## 2020-09-09 DIAGNOSIS — M9902 Segmental and somatic dysfunction of thoracic region: Secondary | ICD-10-CM | POA: Diagnosis not present

## 2020-09-09 DIAGNOSIS — M9901 Segmental and somatic dysfunction of cervical region: Secondary | ICD-10-CM | POA: Diagnosis not present

## 2020-09-15 NOTE — Telephone Encounter (Signed)
Appt scheduled 09/16/20 at 11:30am.

## 2020-09-16 ENCOUNTER — Other Ambulatory Visit: Payer: Self-pay

## 2020-09-16 ENCOUNTER — Ambulatory Visit: Payer: BC Managed Care – PPO | Admitting: Obstetrics and Gynecology

## 2020-09-16 ENCOUNTER — Encounter: Payer: Self-pay | Admitting: Obstetrics and Gynecology

## 2020-09-16 VITALS — BP 140/76 | HR 76 | Ht 67.25 in | Wt 291.0 lb

## 2020-09-16 DIAGNOSIS — N950 Postmenopausal bleeding: Secondary | ICD-10-CM

## 2020-09-16 DIAGNOSIS — N840 Polyp of corpus uteri: Secondary | ICD-10-CM | POA: Diagnosis not present

## 2020-09-16 NOTE — Patient Instructions (Signed)
Atlas of pelvic anatomy and gynecologic surgery (4th ed., pp. 205-212). [place], [place]: Elsevier.">  Dilation and Curettage or Vacuum Curettage Dilation and curettage (D&C) and vacuum curettage are minor procedures. A D&C involves stretching the cervix (dilation) and scraping the inside lining of the uterus with surgical instruments (curettage). During a D&C, tissue is gently scraped from the lining of the uterus (endometrium), starting from the top portion of the uterus down to the lowest part of the uterus. During a vacuum curettage, the lining and tissue in the uterus are removed with the use of gentle suction. Curettage may be performed to either diagnose or treat a problem. For diagnosis A diagnostic curettage may be done if you have:  Irregular bleeding in the uterus.  Bleeding with the development of clots.  Spotting between menstrual periods.  Prolonged menstrual periods or other abnormal bleeding.  Bleeding after menopause.  No menstrual period (amenorrhea).  A change in size and shape of the uterus.  Abnormal endometrial cells discovered during a Pap test. For treatment Curettage may be done:  To remove an IUD (intrauterine device).  To remove remaining placenta after giving birth.  During an abortion.  During a miscarriage.  To remove growths in the lining of the uterus.  To remove some rare types of non-cancerous lumps (fibroids). Tell a health care provider about:  Any allergies you have, including allergies to prescribed medicine or latex.  All medicines you are taking, including vitamins, herbs, eye drops, creams, and over-the-counter medicines.  Any blood-thinning medicine you may be taking.  Any problems you or family members have had with anesthetic medicines.  Any blood disorders you have.  Any surgeries you have had.  Your medical history and any medical conditions you have.  Whether you are pregnant or may be pregnant.  Recent vaginal  infections you have had.  Recent menstrual periods, bleeding problems you have had, and what form of birth control (contraception) you use. What are the risks? Generally, this is a safe procedure. However, problems may occur, including:  Infection.  Heavy vaginal bleeding.  Allergic reactions to medicines.  Damage to the cervix or other structures or organs.  Development of scar tissue (adhesions) inside the uterus. This can cause abnormal periods and may make it harder to get pregnant.  A hole (perforation) in the wall of the uterus. This is rare. What happens before the procedure? Staying hydrated Follow instructions from your health care provider about hydration, which may include:  Up to 2 hours before the procedure - you may continue to drink clear liquids, such as water, clear fruit juice, black coffee, and plain tea.   Eating and drinking restrictions Follow instructions from your health care provider about eating and drinking, which may include:  8 hours before the procedure - stop eating heavy meals or foods, such as meat, fried foods, or fatty foods.  6 hours before the procedure - stop eating light meals or foods, such as toast or cereal.  6 hours before the procedure - stop drinking milk or drinks that contain milk.  2 hours before the procedure - stop drinking clear liquids. If your health care provider told you to take your medicine(s) on the day of your procedure, take them with only a sip of water. Medicines  Ask your health care provider about: ? Changing or stopping your regular medicines. This is especially important if you are taking diabetes medicines or blood thinners. ? Taking medicines such as aspirin and ibuprofen. These medicines can  thin your blood. Do not take these medicines unless your health care provider tells you to take them. ? Taking over-the-counter medicines, vitamins, herbs, and supplements.  You may be given a medicine to soften the cervix  in order to help with dilation. Surgery safety Ask your health care provider what steps will be taken to help prevent infection. These may include:  Removing hair at the surgery site.  Washing skin with a germ-killing soap.  Taking antibiotic medicine. General instructions  Do not use any products that contain nicotine or tobacco for at least 4 weeks before the procedure. These products include cigarettes, e-cigarettes, and chewing tobacco. If you need help quitting, ask your health care provider.  For 24 hours before your procedure, do not: ? Douche. ? Use tampons. ? Use medicines, creams, or suppositories in the vagina. ? Have sex.  You may be given a pregnancy test on the day of the procedure.  You may have a blood or urine sample taken.  Plan to have someone take you home from the hospital or clinic.  If you will be going home right after the procedure, plan to have someone with you for 24 hours. What happens during the procedure?  An IV will be inserted into one of your veins.  You will be given one of the following: ? A medicine that numbs the area in and around the cervix (local anesthetic). ? A medicine to make you fall asleep (general anesthetic).  You will lie down on your back, with your feet in foot rests (stirrups).  The size and position of your uterus will be checked.  A lubricated instrument (speculum or Sims retractor) will be inserted into the back side of your vagina. The speculum will be used to hold apart the walls of your vagina so your health care provider can see your cervix.  A tool (tenaculum) will be attached to the lip of the cervix to stabilize it.  Your cervix will be softened and dilated. This may be done by: ? Taking medicine, either orally or vaginally. ? Having thin rods (laminaria) or gradual widening instruments (tapered dilators) inserted into your cervix.  A small, sharp, curved instrument (curette) will be used to scrape a small  amount of tissue or cells from the endometrium or cervical canal. In some cases, gentle suction is applied with the curette.  The curette will then be removed.  The cells will be taken to a lab for testing. The procedure may vary among health care providers and hospitals.   What happens after the procedure?  Your blood pressure, heart rate, breathing rate, and blood oxygen level will be monitored until you leave the hospital or clinic.  You may have mild cramping, backache, pain, and light bleeding or spotting. You may pass small blood clots from your vagina.  You may have to wear compression stockings. These stockings help to prevent blood clots and reduce swelling in your legs. Summary  Dilation and curettage (D&C) involves stretching (dilating) the cervix and scraping the inside lining of the uterus (curettage).  Follow your health care provider's instructions about when to stop eating and drinking, and whether to stop or change any medicines.  After the procedure, you may have mild cramping, backache, pain, and light bleeding or spotting. You may pass small blood clots from your vagina.  Plan to have someone take you home from the hospital or clinic. This information is not intended to replace advice given to you by your health care  provider. Make sure you discuss any questions you have with your health care provider. Document Revised: 07/03/2019 Document Reviewed: 07/03/2019 Elsevier Patient Education  2021 [employer]. Hysteroscopy Hysteroscopy is a procedure used to look inside a woman's womb (uterus). This may be done for various reasons, including:  To look for tumors and other growths in the uterus.  To evaluate abnormal bleeding, fibroid tumors, polyps, scar tissue, or uterine cancer.  To determine why a woman is unable to get pregnant or has had repeated pregnancy losses.  To locate an IUD (intrauterine device).  To place a birth control device into the fallopian  tubes. During this procedure, a thin, flexible tube with a small light and camera (hysteroscope) is used to examine the uterus. The camera sends images to a monitor in the room so that your health care provider can view the inside of your uterus. A hysteroscopy should be done right after a menstrual period. Tell a health care provider about:  Any allergies you have.  All medicines you are taking, including vitamins, herbs, eye drops, creams, and over-the-counter medicines.  Any problems you or family members have had with anesthetic medicines.  Any blood disorders you have.  Any surgeries you have had.  Any medical conditions you have.  Whether you are pregnant or may be pregnant.  Whether you have been diagnosed with an STI (sexually transmitted infection) or you think you have an STI. What are the risks? Generally, this is a safe procedure. However, problems may occur, including:  Excessive bleeding.  Infection.  Damage to the uterus or other structures or organs.  Allergic reaction to medicines or fluids that are used in the procedure. What happens before the procedure? Staying hydrated Follow instructions from your health care provider about hydration, which may include:  Up to 2 hours before the procedure - you may continue to drink clear liquids, such as water, clear fruit juice, black coffee, and plain tea. Eating and drinking restrictions Follow instructions from your health care provider about eating and drinking, which may include:  8 hours before the procedure - stop eating solid foods and drink clear liquids only.  2 hours before the procedure - stop drinking clear liquids. Medicines  Ask your health care provider about: ? Changing or stopping your regular medicines. This is especially important if you are taking diabetes medicines or blood thinners. ? Taking medicines such as aspirin and ibuprofen. These medicines can thin your blood. Do not take these  medicines unless your health care provider tells you to take them. ? Taking over-the-counter medicines, vitamins, herbs, and supplements.  Medicine may be placed in your cervix the day before the procedure. This medicine causes the cervix to open (dilate). The larger opening makes it easier for the hysteroscope to be inserted into the uterus during the procedure. General instructions  Ask your health care provider: ? What steps will be taken to help prevent infection. These steps may include:  Washing skin with a germ-killing soap.  Taking antibiotic medicine.  Do not use any products that contain nicotine or tobacco for at least 4 weeks before the procedure. These products include cigarettes, chewing tobacco, and vaping devices, such as e-cigarettes. If you need help quitting, ask your health care provider.  Plan to have a responsible adult take you home from the hospital or clinic.  Plan to have a responsible adult care for you for the time you are told after you leave the hospital or clinic. This is important.  Empty your bladder before the procedure begins. What happens during the procedure?  An IV will be inserted into one of your veins.  You may be given: ? A medicine to help you relax (sedative). ? A medicine that numbs the area around the cervix (local anesthetic). ? A medicine to make you fall asleep (general anesthetic).  A hysteroscope will be inserted through your vagina and into your uterus.  Air or fluid will be used to enlarge your uterus to allow your health care provider to see it better. The amount of fluid used will be carefully checked throughout the procedure.  In some cases, tissue may be gently scraped from inside the uterus and sent to a lab for testing (biopsy). The procedure may vary among health care providers and hospitals. What can I expect after the procedure?  Your blood pressure, heart rate, breathing rate, and blood oxygen level will be monitored  until you leave the hospital or clinic.  You may have cramps. You may be given medicines for this.  You may have bleeding, which may vary from light spotting to menstrual-like bleeding. This is normal.  If you had a biopsy, it is up to you to get the results. Ask your health care provider, or the department that is doing the procedure, when your results will be ready. Follow these instructions at home: Activity  Rest as told by your health care provider.  Return to your normal activities as told by your health care provider. Ask your health care provider what activities are safe for you.  If you were given a sedative during the procedure, it can affect you for several hours. Do not drive or operate machinery until your health care provider says that it is safe. Medicines  Do not take aspirin or other NSAIDs during recovery, as told by your healthcare provider. It can increase the risk of bleeding.  Ask your health care provider if the medicine prescribed to you: ? Requires you to avoid driving or using machinery. ? Can cause constipation. You may need to take these actions to prevent or treat constipation:  Drink enough fluid to keep your urine pale yellow.  Take over-the-counter or prescription medicines.  Eat foods that are high in fiber, such as beans, whole grains, and fresh fruits and vegetables.  Limit foods that are high in fat and processed sugars, such as fried or sweet foods. General instructions  Do not douche, use tampons, or have sex for 2 weeks after the procedure, or until your health care provider approves.  Do not take baths, swim, or use a hot tub until your health care provider approves. Take showers instead of baths for 2 weeks, or for as long as told by your health care provider.  Keep all follow-up visits. This is important. Contact a health care provider if:  You feel dizzy or lightheaded.  You feel nauseous.  You have abnormal vaginal  discharge.  You have a rash.  You have pain that does not get better with medicine.  You have chills. Get help right away if:  You have bleeding that is heavier than a normal menstrual period.  You have a fever.  You have pain or cramps that get worse.  You develop new abdominal pain.  You faint.  You have pain in your shoulder.  You are short of breath. Summary  Hysteroscopy is a procedure that is used to look inside a woman's womb (uterus).  After the procedure, you may have bleeding,  which varies from light spotting to menstrual-like bleeding. This is normal. You may also have cramps.  Do not douche, use tampons, or have sex for 2 weeks after the procedure, or until your health care provider approves.  Plan to have a responsible adult take you home from the hospital or clinic. This information is not intended to replace advice given to you by your health care provider. Make sure you discuss any questions you have with your health care provider. Document Revised: 01/16/2020 Document Reviewed: 01/16/2020 Elsevier Patient Education  2021 [employer].

## 2020-09-16 NOTE — Progress Notes (Signed)
GYNECOLOGY  VISIT   HPI: 55 y.o.   Married  Caucasian  female   [phone] with Patient's last menstrual period was 06/14/2016 (approximate).   here for consult.  Patient has ongoing postmenopausal bleeding.  She had a repeat endometrial biopsy showing a benign endometrial polyp and an ECC showing benign squamous and endocervical mucosa.  Her ultrasound documented an endometrial stripe of 8.11 mm and her initial endometrial biopsy showed endocervical glandular tissue with microglandular adenosis, atypical squamous epithelium, possible LGSIL.  GYNECOLOGIC HISTORY: Patient's last menstrual period was 06/14/2016 (approximate). Contraception:  PMP Menopausal hormone therapy:  none Last mammogram: 08-29-20 sent to scan center Last pap smear: 07/03/20 Neg:HR HPV neg, 12-07-18 Neg:Neg HR HPV, 11-07-15 Neg:Neg HR HPV        OB History    Gravida  4   Para  4   Term  4   Preterm      AB      Living  4     SAB      IAB      Ectopic      Multiple      Live Births  4              Patient Active Problem List   Diagnosis Date Noted  . Coughing 11/24/2017  . Globus sensation 11/24/2017  . Laryngopharyngeal reflux (LPR) 11/24/2017  . Allergic reaction 03/02/2017  . Contact dermatitis due to chemicals 09/22/2016  . Allergic rhinitis 09/01/2015  . Mild persistent asthma 09/01/2015  . Food Allergy 09/01/2015  . Cystocele 11/02/2013    Past Medical History:  Diagnosis Date  . Elevated alkaline phosphatase level 10/21/2017   138 - Dr. Geryl Rankins  . Elevated ALT measurement 10/21/2017   62 - Dr. Theador Hawthorne  . Elevated cholesterol 10/21/2017   Dr. Geryl Rankins  . Hashimoto's disease   . Hypertension    asthma related no medication taken  . Plantar fasciitis 2007  . Thigh DVT (deep venous thrombosis) ([place]) 1995    Past Surgical History:  Procedure Laterality Date  . HAND SURGERY Right 2006  . KNEE ARTHROSCOPY Left 04/2010  . LAPAROSCOPY  1991  . SHOULDER ARTHROSCOPY  2004     Current Outpatient Medications  Medication Sig Dispense Refill  . albuterol (PROVENTIL HFA) 108 (90 Base) MCG/ACT inhaler Inhale 2 puffs into the lungs every 4 (four) hours as needed. 18 g 1  . ARNUITY ELLIPTA 100 MCG/ACT AEPB INHALE ONE PUFF INTO LUNGS DAILY 90 each 3  . buPROPion (WELLBUTRIN XL) 300 MG 24 hr tablet Take 300 mg by mouth daily.    Marland Kitchen EPINEPHrine 0.3 mg/0.3 mL IJ SOAJ injection SMARTSIG:0.3 Milliliter(s) IM Once    . losartan (COZAAR) 50 MG tablet TK 1 T PO  BID  5  . Magnesium (OPTIMAG 125 PO) Take by mouth.    . modafinil (PROVIGIL) 100 MG tablet Take 100 mg by mouth daily.    Marland Kitchen UNABLE TO FIND 5HTP CR 141m 1 tab twice daily    . UNABLE TO FIND Low dose naltrexone 4.566m1 at bedtime    . Vitamin A 2250 MCG (7500 UT) CAPS Take by mouth.    . Vitamin D, Ergocalciferol, (DRISDOL) 50000 units CAPS capsule Take 50,000 Units by mouth. Every 6 days     No current facility-administered medications for this visit.     ALLERGIES: Beef-derived products, Cat hair extract, Fish allergy, Lambs quarters, Other, Peanut oil, Pork-derived products, Shellfish allergy, Bee venom, Latex,  and Pollen extract  Family History  Problem Relation Age of Onset  . Arthritis Mother   . Hypertension Mother   . Diabetes Mother   . Allergic rhinitis Neg Hx   . Angioedema Neg Hx   . Eczema Neg Hx   . Asthma Neg Hx   . Immunodeficiency Neg Hx   . Urticaria Neg Hx     Social History   Socioeconomic History  . Marital status: Married    Spouse name: Not on file  . Number of children: Not on file  . Years of education: Not on file  . Highest education level: Not on file  Occupational History  . Not on file  Tobacco Use  . Smoking status: Never Smoker  . Smokeless tobacco: Never Used  Vaping Use  . Vaping Use: Never used  Substance and Sexual Activity  . Alcohol use: Yes    Alcohol/week: 0.0 - 1.0 standard drinks  . Drug use: No  . Sexual activity: Yes    Partners: Male    Birth  control/protection: Post-menopausal  Other Topics Concern  . Not on file  Social History Narrative  . Not on file   Social Determinants of Health   Financial Resource Strain: Not on file  Food Insecurity: Not on file  Transportation Needs: Not on file  Physical Activity: Not on file  Stress: Not on file  Social Connections: Not on file  Intimate Partner Violence: Not on file    Review of Systems  All other systems reviewed and are negative.   PHYSICAL EXAMINATION:    BP 140/76 (Cuff Size: Large)   Pulse 76   Ht 5' 7.25" (1.708 m)   Wt 291 lb (132 kg)   LMP 06/14/2016 (Approximate)   SpO2 98%   BMI 45.24 kg/m     General appearance: alert, cooperative and appears stated age Head: Normocephalic, without obvious abnormality, atraumatic Lungs: clear to auscultation bilaterally Heart: regular rate and rhythm Neurologic: Grossly normal  Pelvic: deferred.   ASSESSMENT  Postmenopausal bleeding.  Benign edometrial polyp.  Hx DVT.  Etiology related to birth control.  PLAN  Discussion of postmenopausal bleeding and endometrial polyp. Discussion of hysteroscopy with Myosure polypectomy, dilation and curettage.  Risks, benefits, and alternatives reviewed. Risks include but are not limited to bleeding, infection, damage to surrounding organs including uterine perforation requiring hospitalization and laparoscopy, pulmonary edema, reaction to anesthesia, DVT, PE, death, need for further treatment. The possibility of negative findings also reviewed. Surgical expectations and recovery discussed.  Patient wishes to proceed. Will need Lovenox preop.  She will need to take her last dose of Naltrexone the day prior to surgery.   31 min  total time was spent for this patient encounter, including preparation, face-to-face counseling with the patient, coordination of care, and documentation of the encounter.

## 2020-09-18 ENCOUNTER — Telehealth: Payer: Self-pay | Admitting: Obstetrics and Gynecology

## 2020-09-18 NOTE — Telephone Encounter (Signed)
Please precert and schedule surgery for my patient.  She will need a hysteroscopy with Myosure polypectomy, dilation and curettage for postmenopausal bleeding and an endometrial polyp.

## 2020-09-22 NOTE — Telephone Encounter (Signed)
Call to patient. Per DPR, OK to leave message on voicemail.   Left voicemail requesting a return call to [facility] to review benefits for scheduled surgery with [name], MD, [name].

## 2020-09-22 NOTE — Telephone Encounter (Signed)
Spoke with patient, reviewed surgery dates and Covid quarantine requirements. Patient request to proceed with surgery on 10/20/20.  Advised patient I will return call once surgery is posted to confirm and review pre-op instructions.  Patient agreeable.   Routing to [facility]

## 2020-09-23 NOTE — Telephone Encounter (Signed)
Spoke with patient. Surgery date request confirmed.  Advised surgery is scheduled for  10/20/20, [place] 0730.  Surgery instruction sheet and hospital brochure reviewed, printed copy will be mailed.  Patient advised of Covid screening and quarantine requirements and agreeable.    Call transferred to [facility].

## 2020-09-23 NOTE — Telephone Encounter (Signed)
Left message to call Erik Nessel, RN at [facility], [phone].  

## 2020-09-23 NOTE — Telephone Encounter (Signed)
Left message to call Sharee Pimple, RN at [facility], [phone]. Called to confirm surgery date.

## 2020-09-23 NOTE — Telephone Encounter (Signed)
Spoke with patient regarding surgery benefits. Patient acknowledges understanding of information presented. Patient is aware that benefits presented are professional benefits only. Patient is aware that once surgery is scheduled, the hospital will call with separate benefits. See account note.  Encounter closed.

## 2020-10-02 ENCOUNTER — Ambulatory Visit: Payer: BC Managed Care – PPO | Admitting: Allergy and Immunology

## 2020-10-06 ENCOUNTER — Encounter: Payer: Self-pay | Admitting: Allergy

## 2020-10-06 ENCOUNTER — Ambulatory Visit: Payer: BC Managed Care – PPO | Admitting: Allergy

## 2020-10-06 ENCOUNTER — Other Ambulatory Visit: Payer: Self-pay

## 2020-10-06 VITALS — BP 142/76 | HR 78 | Temp 97.8°F | Resp 20 | Ht 67.0 in | Wt 286.8 lb

## 2020-10-06 DIAGNOSIS — T781XXD Other adverse food reactions, not elsewhere classified, subsequent encounter: Secondary | ICD-10-CM

## 2020-10-06 DIAGNOSIS — T7809XD Anaphylactic reaction due to other food products, subsequent encounter: Secondary | ICD-10-CM

## 2020-10-06 DIAGNOSIS — J3089 Other allergic rhinitis: Secondary | ICD-10-CM

## 2020-10-06 DIAGNOSIS — J4530 Mild persistent asthma, uncomplicated: Secondary | ICD-10-CM

## 2020-10-06 NOTE — Progress Notes (Signed)
Follow-up Note  RE: Monica Good MRN: [id number] DOB: 27-Mar-1966 Date of Office Visit: 10/06/2020   History of present illness: Monica Good is a 55 y.o. female presenting today for follow-up of asthma, food allergy and allergic rhinitis.  She was last seen in the office on 10/03/2019 by Dr. Shaune Leeks.  She denies any major health changes, surgeries or hospitalizations since this visit.  She is on allergen immunotherapy and does feel the shots are working well.  She states fall and winter were traditionally worse seasons for her.   She does not recall using her albuterol this fall or winter.  She states may have had some nasal congestion, itchy eyes and mild headaches this following winter.  The symptoms are much improved from what she normally would have had prior to being on immunotherapy.  She is not taking any medications.  She previously would take Zyrtec, fluticasone for allergy symptom control.  She states she does take her Zyrtec on the day prior to, day of, and day after immunotherapy.   She does continue to take Arnuity 1 puff daily.  She is wondering if she still needs to take this.  She denies any daytime or nighttime symptoms at this time.  She has not required any ED or urgent care visits or systemic steroid needs. She continues to avoid fish, shellfish and red meats.  She is not had any accidental ingestions or need to use her epinephrine device. She is scheduled to have a D&C in 2 weeks.    Review of systems: Review of Systems  Constitutional: Negative.   HENT: Negative.   Eyes: Negative.   Respiratory: Negative.   Cardiovascular: Negative.   Gastrointestinal: Negative.   Musculoskeletal: Negative.   Skin: Negative.   Neurological: Negative.     All other systems negative unless noted above in HPI  Past medical/social/surgical/family history have been reviewed and are unchanged unless specifically indicated below.  No changes  Medication List: Current  Outpatient Medications  Medication Sig Dispense Refill  . albuterol (PROVENTIL HFA) 108 (90 Base) MCG/ACT inhaler Inhale 2 puffs into the lungs every 4 (four) hours as needed. 18 g 1  . ascorbic acid (VITAMIN C) 1000 MG tablet Take 1,000 mg by mouth daily.    Marland Kitchen buPROPion (WELLBUTRIN XL) 300 MG 24 hr tablet Take 300 mg by mouth daily.    Marland Kitchen EPINEPHrine 0.3 mg/0.3 mL IJ SOAJ injection SMARTSIG:0.3 Milliliter(s) IM Once    . fluticasone furoate-vilanterol (BREO ELLIPTA) 100-25 MCG/INH AEPB Inhale 1 puff into the lungs daily.    Marland Kitchen losartan (COZAAR) 50 MG tablet TK 1 T PO  BID  5  . Magnesium (OPTIMAG 125 PO) Take by mouth.    . metFORMIN (GLUCOPHAGE) 500 MG tablet Take by mouth 2 (two) times daily with a meal.    . modafinil (PROVIGIL) 100 MG tablet Take 100 mg by mouth daily.    Marland Kitchen NALTREXONE HCL PO Take 4.5 mg by mouth 2 (two) times daily.    Marland Kitchen UNABLE TO FIND 5HTP CR 1107m 1 tab twice daily    . Vitamin A 2250 MCG (7500 UT) CAPS Take by mouth.    . Vitamin D, Ergocalciferol, (DRISDOL) 50000 units CAPS capsule Take 50,000 Units by mouth. Every 6 days     No current facility-administered medications for this visit.     Known medication allergies: Allergies  Allergen Reactions  . Beef-Derived Products Anaphylaxis  . Cat Hair Extract Anaphylaxis  . Fish Allergy  Anaphylaxis  . Lambs Quarters Anaphylaxis    Has not had but is warned by allergist that could have anaphylaxis.  . Other Anaphylaxis    **SEAFOOD**  . Peanut Oil Anaphylaxis  . Pork-Derived Products Anaphylaxis  . Shellfish Allergy Anaphylaxis  . Bee Venom Swelling  . Latex   . Pollen Extract      Physical examination: Blood pressure (!) 142/76, pulse 78, temperature 97.8 F (36.6 C), temperature source Temporal, resp. rate 20, height 5' 7"  (1.702 m), weight 286 lb 12.8 oz (130.1 kg), last menstrual period 06/14/2016, SpO2 97 %.  General: Alert, interactive, in no acute distress. HEENT: PERRLA, TMs pearly gray, turbinates  non-edematous without discharge, post-pharynx non erythematous. Neck: Supple without lymphadenopathy. Lungs: Clear to auscultation without wheezing, rhonchi or rales. {no increased work of breathing. CV: Normal S1, S2 without murmurs. Abdomen: Nondistended, nontender. Skin: Warm and dry, without lesions or rashes. Extremities:  No clubbing, cyanosis or edema. Neuro:   Grossly intact.  Diagnositics/Labs: Spirometry: FEV1: 2.28 L 80%, FVC: 2.95 L 81%, ratio consistent with Nonobstructive pattern  Assessment and plan:   Asthma -Under good control -Have access to albuterol inhaler 2 puffs every 4-6 hours as needed for cough/wheeze/shortness of breath/chest tightness.  May use 15-20 minutes prior to activity.   Monitor frequency of use.   -Recommend stopping Arnuity at this time.  Discussed if she has any return of asthma symptoms or need to use her rescue inhaler or not meeting the below goals then she should resume Arnuity 1 puff daily  Asthma control goals:   Full participation in all desired activities (may need albuterol before activity)  Albuterol use two time or less a week on average (not counting use with activity)  Cough interfering with sleep two time or less a month  Oral steroids no more than once a year  No hospitalizations  Allergic rhinitis -Continue immunotherapy at this time.  Discussed she is in the timeframe where she can stop.  She has had improvement in her symptoms but still having some slight symptoms during fall and winter season.  Will like to see how she does with this next fall season.  Discussed repeat skin testing in December to see if she she will be able to stop immunotherapy at that time -Continue Zyrtec with immunotherapy injections -Use Zyrtec daily as needed for general allergy symptom control -Use fluticasone 2 sprays each nostril daily as needed for nasal congestion or drainage control  Food allergy -Continue avoidance of fish, shellfish and red  meats -Have access to self-injectable epinephrine (Epipen or AuviQ) 0.73m at all times -Follow emergency action plan in case of allergic reaction  Follow-up in December 2022 for skin testing.  Hold all antihistamines 3 days prior to testing visit  I appreciate the opportunity to take part in Tyronda's care. Please do not hesitate to contact me with questions.  Sincerely,   SPrudy Feeler MD [facility]

## 2020-10-06 NOTE — Patient Instructions (Signed)
Asthma -Under good control -Have access to albuterol inhaler 2 puffs every 4-6 hours as needed for cough/wheeze/shortness of breath/chest tightness.  May use 15-20 minutes prior to activity.   Monitor frequency of use.   -Recommend stopping Arnuity at this time.  Discussed if she has any return of asthma symptoms or need to use her rescue inhaler or not meeting the below goals then she should resume Arnuity 1 puff daily  Asthma control goals:   Full participation in all desired activities (may need albuterol before activity)  Albuterol use two time or less a week on average (not counting use with activity)  Cough interfering with sleep two time or less a month  Oral steroids no more than once a year  No hospitalizations  Allergic rhinitis -Continue immunotherapy at this time.  Discussed she is in the timeframe where she can stop.  She has had improvement in her symptoms but still having some slight symptoms during fall and winter season.  Will like to see how she does with this next fall season.  Discussed repeat skin testing in December to see if she she will be able to stop immunotherapy at that time -Continue Zyrtec with immunotherapy injections -Use Zyrtec daily as needed for general allergy symptom control -Use fluticasone 2 sprays each nostril daily as needed for nasal congestion or drainage control  Food allergy -Continue avoidance of fish, shellfish and red meats -Have access to self-injectable epinephrine (Epipen or AuviQ) 0.14m at all times -Follow emergency action plan in case of allergic reaction  Follow-up in December 2022 for skin testing.  Hold all antihistamines 3 days prior to testing visit

## 2020-10-07 ENCOUNTER — Ambulatory Visit (INDEPENDENT_AMBULATORY_CARE_PROVIDER_SITE_OTHER): Payer: BC Managed Care – PPO | Admitting: *Deleted

## 2020-10-07 DIAGNOSIS — M9902 Segmental and somatic dysfunction of thoracic region: Secondary | ICD-10-CM | POA: Diagnosis not present

## 2020-10-07 DIAGNOSIS — J309 Allergic rhinitis, unspecified: Secondary | ICD-10-CM | POA: Diagnosis not present

## 2020-10-07 DIAGNOSIS — M9903 Segmental and somatic dysfunction of lumbar region: Secondary | ICD-10-CM | POA: Diagnosis not present

## 2020-10-07 DIAGNOSIS — M9901 Segmental and somatic dysfunction of cervical region: Secondary | ICD-10-CM | POA: Diagnosis not present

## 2020-10-07 DIAGNOSIS — M9905 Segmental and somatic dysfunction of pelvic region: Secondary | ICD-10-CM | POA: Diagnosis not present

## 2020-10-08 ENCOUNTER — Other Ambulatory Visit: Payer: Self-pay

## 2020-10-08 ENCOUNTER — Encounter: Payer: Self-pay | Admitting: Obstetrics and Gynecology

## 2020-10-08 ENCOUNTER — Ambulatory Visit: Payer: BC Managed Care – PPO | Admitting: Obstetrics and Gynecology

## 2020-10-08 VITALS — BP 124/70 | HR 91 | Ht 67.25 in | Wt 291.0 lb

## 2020-10-08 DIAGNOSIS — N950 Postmenopausal bleeding: Secondary | ICD-10-CM

## 2020-10-08 DIAGNOSIS — N840 Polyp of corpus uteri: Secondary | ICD-10-CM

## 2020-10-08 NOTE — Progress Notes (Signed)
GYNECOLOGY  VISIT   HPI: 55 y.o.   Married  Caucasian  female   [phone] with Patient's last menstrual period was 06/14/2016 (approximate).   here for surgical consult.   Patient has ongoing postmenopausal bleeding.  She had a repeat endometrial biopsy showing a benign endometrial polyp and an ECC showing benign squamous and endocervical mucosa.  Her ultrasound documented an endometrial stripe of 8.11 mm and her initial endometrial biopsy showed endocervical glandular tissue with microglandular adenosis, atypical squamous epithelium, possible LGSIL.  Bleeding stopped and then restarted.  Having some abdominal cramping.   Not tired or weak.  Taking supplements.   Seeing Thressa Sheller for Integrative Medicine.  Last Hgb was 13 prior to Christmas.   Taking twice daily Naltrexone for her thyroid dysfunction.   GYNECOLOGIC HISTORY: Patient's last menstrual period was 06/14/2016 (approximate). Contraception:  PMP Menopausal hormone therapy:  none Last mammogram: 08-29-20 sent to scan center Last pap smear:  07/03/20 Neg:HR HPV neg, 12-07-18 Neg:Neg HR HPV, 11-07-15 Neg:Neg HR HPV        OB History    Gravida  4   Para  4   Term  4   Preterm      AB      Living  4     SAB      IAB      Ectopic      Multiple      Live Births  4              Patient Active Problem List   Diagnosis Date Noted  . Coughing 11/24/2017  . Globus sensation 11/24/2017  . Laryngopharyngeal reflux (LPR) 11/24/2017  . Allergic reaction 03/02/2017  . Contact dermatitis due to chemicals 09/22/2016  . Allergic rhinitis 09/01/2015  . Mild persistent asthma 09/01/2015  . Food Allergy 09/01/2015  . Cystocele 11/02/2013    Past Medical History:  Diagnosis Date  . Elevated alkaline phosphatase level 10/21/2017   138 - Dr. Geryl Rankins  . Elevated ALT measurement 10/21/2017   62 - Dr. Theador Hawthorne  . Elevated cholesterol 10/21/2017   Dr. Geryl Rankins  . Hashimoto's disease   . Hypertension     asthma related no medication taken  . Plantar fasciitis 2007  . Thigh DVT (deep venous thrombosis) ([place]) 1995    Past Surgical History:  Procedure Laterality Date  . HAND SURGERY Right 2006  . KNEE ARTHROSCOPY Left 04/2010  . LAPAROSCOPY  1991  . SHOULDER ARTHROSCOPY  2004    Current Outpatient Medications  Medication Sig Dispense Refill  . Acetylcysteine (NAC PO) Take by mouth. Takes 900 mg daily    . albuterol (PROVENTIL HFA) 108 (90 Base) MCG/ACT inhaler Inhale 2 puffs into the lungs every 4 (four) hours as needed. 18 g 1  . buPROPion (WELLBUTRIN XL) 300 MG 24 hr tablet Take 300 mg by mouth daily.    Marland Kitchen EPINEPHrine 0.3 mg/0.3 mL IJ SOAJ injection SMARTSIG:0.3 Milliliter(s) IM Once    . fluticasone furoate-vilanterol (BREO ELLIPTA) 100-25 MCG/INH AEPB Inhale 1 puff into the lungs daily.    Marland Kitchen losartan (COZAAR) 50 MG tablet TK 1 T PO  BID  5  . Magnesium (OPTIMAG 125 PO) Take by mouth.    . Melatonin 5 MG CAPS Take 1 capsule by mouth at bedtime as needed.    . metFORMIN (GLUCOPHAGE) 500 MG tablet Take by mouth 2 (two) times daily with a meal.    . modafinil (PROVIGIL) 100 MG tablet Take  100 mg by mouth daily.    . Multiple Vitamin (MULTIVITAMIN) capsule Take 1 capsule by mouth daily.    Marland Kitchen NALTREXONE HCL PO Take 4.5 mg by mouth 2 (two) times daily.    Marland Kitchen UNABLE TO FIND 5HTP CR 172m 1 tab twice daily    . Vitamin A 2250 MCG (7500 UT) CAPS Take by mouth.    . Vitamin D, Ergocalciferol, (DRISDOL) 50000 units CAPS capsule Take 50,000 Units by mouth. Every 6 days     No current facility-administered medications for this visit.     ALLERGIES: Beef-derived products, Cat hair extract, Fish allergy, Lambs quarters, Other, Peanut oil, Pork-derived products, Shellfish allergy, Bee venom, Latex, and Pollen extract  Family History  Problem Relation Age of Onset  . Arthritis Mother   . Hypertension Mother   . Diabetes Mother   . Allergic rhinitis Neg Hx   . Angioedema Neg Hx   . Eczema Neg  Hx   . Asthma Neg Hx   . Immunodeficiency Neg Hx   . Urticaria Neg Hx     Social History   Socioeconomic History  . Marital status: Married    Spouse name: Not on file  . Number of children: Not on file  . Years of education: Not on file  . Highest education level: Not on file  Occupational History  . Not on file  Tobacco Use  . Smoking status: Never Smoker  . Smokeless tobacco: Never Used  Vaping Use  . Vaping Use: Never used  Substance and Sexual Activity  . Alcohol use: Yes    Alcohol/week: 0.0 - 1.0 standard drinks  . Drug use: No  . Sexual activity: Yes    Partners: Male    Birth control/protection: Post-menopausal  Other Topics Concern  . Not on file  Social History Narrative  . Not on file   Social Determinants of Health   Financial Resource Strain: Not on file  Food Insecurity: Not on file  Transportation Needs: Not on file  Physical Activity: Not on file  Stress: Not on file  Social Connections: Not on file  Intimate Partner Violence: Not on file    Review of Systems  All other systems reviewed and are negative.   PHYSICAL EXAMINATION:    BP 124/70   Pulse 91   Ht 5' 7.25" (1.708 m)   Wt 291 lb (132 kg)   LMP 06/14/2016 (Approximate)   SpO2 98%   BMI 45.24 kg/m     General appearance: alert, cooperative and appears stated age Head: Normocephalic, without obvious abnormality, atraumatic Neck: no adenopathy, supple, symmetrical, trachea midline and thyroid normal to inspection and palpation Lungs: clear to auscultation bilaterally Heart: regular rate and rhythm Abdomen: soft, non-tender, no masses,  no organomegaly Extremities: extremities normal, atraumatic, no cyanosis or edema Skin: Skin color, texture, turgor normal. No rashes or lesions Lymph nodes: Cervical, supraclavicular nodes normal. No abnormal inguinal nodes palpated Neurologic: Grossly normal  Pelvic: External genitalia:  no lesions              Urethra:  normal appearing  urethra with no masses, tenderness or lesions              Bartholins and Skenes: normal                 Vagina: normal appearing vagina with normal color and discharge, no lesions              Cervix: no lesions  Bimanual Exam:  Uterus:  normal size, contour, position, consistency, mobility, non-tender              Adnexa: no mass, fullness, tenderness            Chaperone was present for exam.  ASSESSMENT  Postmenopausal bleeding.  Endometrial polyp. Hx DVT while on combined oral contraceptives.  PLAN  Will proceed with hysteroscopy with Myosure resection of endometrial polyp, dilation and curettage.  Risks, benefits, and alternatives discussed with the patient who wishes to proceed. She will receive Lovenox preop for DVT prophylaxis. Her last dose of Naltrexone will be 24 hours prior to surgery. Questions invited and answered.

## 2020-10-14 ENCOUNTER — Encounter (HOSPITAL_BASED_OUTPATIENT_CLINIC_OR_DEPARTMENT_OTHER): Payer: Self-pay | Admitting: Obstetrics and Gynecology

## 2020-10-14 ENCOUNTER — Other Ambulatory Visit: Payer: Self-pay

## 2020-10-14 NOTE — Progress Notes (Addendum)
Spoke w/ via phone for pre-op interview--- PT Lab needs dos----Istat and EKG (per anes)/  Pre-op orders pending             Lab results------ no COVID test ------ 10-16-2020 @ 1505 Arrive at ------- 0700 on 10-20-2020 NPO after MN NO Solid Food.  Clear liquids from MN until--- 0600 Med rec completed Medications to take morning of surgery ----- Breo inhaler Diabetic medication ----- do not take metformin morning of surgery Patient instructed to bring photo id and insurance card day of surgery Patient aware to have Driver (ride ) / caregiver    for 24 hours after surgery -- husband, Torbjorn Patient Special Instructions ----- asked to bring rescue inhaler dos Pre-Op special Istructions ----- sent inbox message to dr Quincy Simmonds in epic requested orders Patient verbalized understanding of instructions that were given at this phone interview. Patient denies shortness of breath, chest pain, fever, cough at this phone interview.

## 2020-10-15 DIAGNOSIS — J3089 Other allergic rhinitis: Secondary | ICD-10-CM | POA: Diagnosis not present

## 2020-10-15 NOTE — H&P (Signed)
Office Visit  10/08/2020 [facility], MD  Obstetrics and Gynecology  Postmenopausal bleeding +1 more  Dx  Surgery consult ; Referred by Carol Ada, MD  Reason for Visit    Additional Documentation  Vitals:  BP 124/70  Pulse 91  Ht 5' 7.25" (1.708 m)  Wt 132 kg  LMP 06/14/2016 (Approximate)  SpO2 98%  BMI 45.24 kg/m  BSA 2.5 m  Flowsheets:  NEWS,  MEWS Score,  Anthropometrics,  Method of Visit    Encounter Info:  Billing Info,  History,  Allergies,  Detailed Report     All Notes    Progress Notes by Nunzio Cobbs, MD at 10/08/2020 4:30 PM  Author: Nunzio Cobbs, MD Author Type: Physician Filed: 10/08/2020  9:48 PM  Note Status: Signed Cosign: Cosign Not Required Encounter Date: 10/08/2020  Editor: Nunzio Cobbs, MD (Physician)      Prior Versions: 1. Lowella Fairy, CMA (Certified Psychologist, sport and exercise) at 10/08/2020  4:47 PM - Sign when Signing Visit    GYNECOLOGY  VISIT   HPI: 55 y.o.   Married  Caucasian  female   [phone] with Patient's last menstrual period was 06/14/2016 (approximate).   here for surgical consult.    Patient has ongoing postmenopausal bleeding.  She had a repeat endometrial biopsy showing a benign endometrial polyp and an ECC showing benign squamous and endocervical mucosa.   Her ultrasound documented an endometrial stripe of 8.11 mm and her initial endometrial biopsy showed endocervical glandular tissue with microglandular adenosis, atypical squamous epithelium, possible LGSIL.   Bleeding stopped and then restarted.  Having some abdominal cramping.    Not tired or weak.  Taking supplements.    Seeing Thressa Sheller for Integrative Medicine.  Last Hgb was 13 prior to Christmas.    Taking twice daily Naltrexone for her thyroid dysfunction.    GYNECOLOGIC HISTORY: Patient's last menstrual period was 06/14/2016  (approximate). Contraception:  PMP Menopausal hormone therapy:  none Last mammogram: 08-29-20 sent to scan center Last pap smear:  07/03/20 Neg:HR HPV neg, 12-07-18 Neg:Neg HR HPV, 11-07-15 Neg:Neg HR HPV                OB History     Gravida  4   Para  4   Term  4   Preterm      AB      Living  4      SAB      IAB      Ectopic      Multiple      Live Births  4                     Patient Active Problem List    Diagnosis Date Noted  . Coughing 11/24/2017  . Globus sensation 11/24/2017  . Laryngopharyngeal reflux (LPR) 11/24/2017  . Allergic reaction 03/02/2017  . Contact dermatitis due to chemicals 09/22/2016  . Allergic rhinitis 09/01/2015  . Mild persistent asthma 09/01/2015  . Food Allergy 09/01/2015  . Cystocele 11/02/2013          Past Medical History:  Diagnosis Date  . Elevated alkaline phosphatase level 10/21/2017    138 - Dr. Geryl Rankins  . Elevated ALT measurement 10/21/2017    62 - Dr. Theador Hawthorne  . Elevated cholesterol 10/21/2017    Dr. Geryl Rankins  . Hashimoto's disease    .  Hypertension      asthma related no medication taken  . Plantar fasciitis 2007  . Thigh DVT (deep venous thrombosis) ([place]) 1995           Past Surgical History:  Procedure Laterality Date  . HAND SURGERY Right 2006  . KNEE ARTHROSCOPY Left 04/2010  . LAPAROSCOPY   1991  . SHOULDER ARTHROSCOPY   2004            Current Outpatient Medications  Medication Sig Dispense Refill  . Acetylcysteine (NAC PO) Take by mouth. Takes 900 mg daily      . albuterol (PROVENTIL HFA) 108 (90 Base) MCG/ACT inhaler Inhale 2 puffs into the lungs every 4 (four) hours as needed. 18 g 1  . buPROPion (WELLBUTRIN XL) 300 MG 24 hr tablet Take 300 mg by mouth daily.      Marland Kitchen EPINEPHrine 0.3 mg/0.3 mL IJ SOAJ injection SMARTSIG:0.3 Milliliter(s) IM Once      . fluticasone furoate-vilanterol (BREO ELLIPTA) 100-25 MCG/INH AEPB Inhale 1 puff into the lungs daily.      Marland Kitchen losartan (COZAAR) 50 MG  tablet TK 1 T PO  BID   5  . Magnesium (OPTIMAG 125 PO) Take by mouth.      . Melatonin 5 MG CAPS Take 1 capsule by mouth at bedtime as needed.      . metFORMIN (GLUCOPHAGE) 500 MG tablet Take by mouth 2 (two) times daily with a meal.      . modafinil (PROVIGIL) 100 MG tablet Take 100 mg by mouth daily.      . Multiple Vitamin (MULTIVITAMIN) capsule Take 1 capsule by mouth daily.      Marland Kitchen NALTREXONE HCL PO Take 4.5 mg by mouth 2 (two) times daily.      Marland Kitchen UNABLE TO FIND 5HTP CR 130m 1 tab twice daily      . Vitamin A 2250 MCG (7500 UT) CAPS Take by mouth.      . Vitamin D, Ergocalciferol, (DRISDOL) 50000 units CAPS capsule Take 50,000 Units by mouth. Every 6 days        No current facility-administered medications for this visit.      ALLERGIES: Beef-derived products, Cat hair extract, Fish allergy, Lambs quarters, Other, Peanut oil, Pork-derived products, Shellfish allergy, Bee venom, Latex, and Pollen extract        Family History  Problem Relation Age of Onset  . Arthritis Mother    . Hypertension Mother    . Diabetes Mother    . Allergic rhinitis Neg Hx    . Angioedema Neg Hx    . Eczema Neg Hx    . Asthma Neg Hx    . Immunodeficiency Neg Hx    . Urticaria Neg Hx        Social History         Socioeconomic History  . Marital status: Married      Spouse name: Not on file  . Number of children: Not on file  . Years of education: Not on file  . Highest education level: Not on file  Occupational History  . Not on file  Tobacco Use  . Smoking status: Never Smoker  . Smokeless tobacco: Never Used  Vaping Use  . Vaping Use: Never used  Substance and Sexual Activity  . Alcohol use: Yes      Alcohol/week: 0.0 - 1.0 standard drinks  . Drug use: No  . Sexual activity: Yes      Partners: Male  Birth control/protection: Post-menopausal  Other Topics Concern  . Not on file  Social History Narrative  . Not on file    Social Determinants of Health    Financial  Resource Strain: Not on file  Food Insecurity: Not on file  Transportation Needs: Not on file  Physical Activity: Not on file  Stress: Not on file  Social Connections: Not on file  Intimate Partner Violence: Not on file      Review of Systems  All other systems reviewed and are negative.     PHYSICAL EXAMINATION:     BP 124/70   Pulse 91   Ht 5' 7.25" (1.708 m)   Wt 291 lb (132 kg)   LMP 06/14/2016 (Approximate)   SpO2 98%   BMI 45.24 kg/m     General appearance: alert, cooperative and appears stated age Head: Normocephalic, without obvious abnormality, atraumatic Neck: no adenopathy, supple, symmetrical, trachea midline and thyroid normal to inspection and palpation Lungs: clear to auscultation bilaterally Heart: regular rate and rhythm Abdomen: soft, non-tender, no masses,  no organomegaly Extremities: extremities normal, atraumatic, no cyanosis or edema Skin: Skin color, texture, turgor normal. No rashes or lesions Lymph nodes: Cervical, supraclavicular nodes normal. No abnormal inguinal nodes palpated Neurologic: Grossly normal   Pelvic: External genitalia:  no lesions              Urethra:  normal appearing urethra with no masses, tenderness or lesions              Bartholins and Skenes: normal                 Vagina: normal appearing vagina with normal color and discharge, no lesions              Cervix: no lesions                Bimanual Exam:  Uterus:  normal size, contour, position, consistency, mobility, non-tender              Adnexa: no mass, fullness, tenderness            Chaperone was present for exam.   ASSESSMENT   Postmenopausal bleeding.  Endometrial polyp. Hx DVT while on combined oral contraceptives.   PLAN   Will proceed with hysteroscopy with Myosure resection of endometrial polyp, dilation and curettage.  Risks, benefits, and alternatives discussed with the patient who wishes to proceed. She will receive Lovenox preop for DVT  prophylaxis. Her last dose of Naltrexone will be 24 hours prior to surgery. Questions invited and answered.   Addendum Will need to consider alternative to Lovenox and heparin for DVT prophylaxis due to pork allergy.  I am reaching out to hematology/oncology for recommendations.

## 2020-10-15 NOTE — Progress Notes (Signed)
VIALS EXP 10-15-21

## 2020-10-16 ENCOUNTER — Telehealth: Payer: Self-pay | Admitting: Obstetrics and Gynecology

## 2020-10-16 ENCOUNTER — Other Ambulatory Visit (HOSPITAL_COMMUNITY)
Admission: RE | Admit: 2020-10-16 | Discharge: 2020-10-16 | Disposition: A | Discharge status: Home / Self Care | Payer: BC Managed Care – PPO | Source: Ambulatory Visit | Attending: Obstetrics and Gynecology | Admitting: Obstetrics and Gynecology

## 2020-10-16 DIAGNOSIS — Z20822 Contact with and (suspected) exposure to covid-19: Secondary | ICD-10-CM | POA: Diagnosis not present

## 2020-10-16 DIAGNOSIS — Z01812 Encounter for preprocedural laboratory examination: Secondary | ICD-10-CM | POA: Insufficient documentation

## 2020-10-16 MED ORDER — RIVAROXABAN 10 MG PO TABS
10.0000 mg | ORAL_TABLET | Freq: Every day | ORAL | 0 refills | Status: DC
Start: 2020-10-16 — End: 2021-03-19

## 2020-10-16 NOTE — Telephone Encounter (Signed)
Please reach out to Dr. Antonieta Pert office to see if he is able to make a recommendation for surgical DVT prophylaxis for this patient.   Thank you.

## 2020-10-16 NOTE — Telephone Encounter (Signed)
Phone call with patient after hours to discuss recommendations for DVT prophylaxis from Dr. Marin Olp.   Start Xarelto 10 mg daily (after Covid test confirmed to be negative) and do not take the day prior to surgery and the morning of surgery.   Take 10 mg by mouth daily after surgery x 10 days.  (Will likely restart the medication the day following surgery.)  Prescription was sent to pharmacy by me.

## 2020-10-16 NOTE — Telephone Encounter (Signed)
-----   Message from Nunzio Cobbs, MD sent at 10/15/2020  1:09 PM EDT ----- Regarding: DVT prophylaxis recommendation Dr. Marin Olp,   I am hoping to run a patient by you that needs DVT prophylaxis for her upcoming hysteroscopy, endometrial polypectomy, and dilation and curettage procedure scheduled for Oct 20, 2020.   My patient is a 55 year old Caucasian [place] 4, 86 4 female with postmenopausal bleeding and endometrial polyp.   She had a unilateral thigh DVT in [place] many years ago when she was taking combined oral contraception.  She has not had any recurrences.  She did not undergo a work up for this.   She is at moderate to high risk of a DVT by the Caprini scoring.  She is 39, has a BMI of 45.24, is having a minor surgery, and has already had a DVT.   I was planning to give her Lovenox preop, but she has anaphylaxis with pork contact.   Are you able to make a recommendation for DVt prophylaxis for her?  Thank you for your consideration of this patient.   Monica Half, MD Gynecology [place]

## 2020-10-16 NOTE — Telephone Encounter (Signed)
-----   Message from Volanda Napoleon, MD sent at 10/16/2020  3:46 PM EDT ----- Regarding: RE: DVT prophylaxis recommendation Saahil Herbster:  Sorry for the late response!!!  I love using Xarelto in situations like this!!  I would give her Xarelto 10 mg po daily then d/c day before procedure.  I would then continue the Xarelto for 10 days after the procedure.  This really should minimize her DVT risk!!  Have a very blessed day and good luck!!  Film/video editor ----- Message ----- From: Nunzio Cobbs, MD Sent: 10/15/2020   1:17 PM EDT To: Nunzio Cobbs, MD, # Subject: DVT prophylaxis recommendation                 Dr. Marin Olp,   I am hoping to run a patient by you that needs DVT prophylaxis for her upcoming hysteroscopy, endometrial polypectomy, and dilation and curettage procedure scheduled for Oct 20, 2020.   My patient is a 55 year old Caucasian [place] Grav[place] 4, 71 4 female with postmenopausal bleeding and endometrial polyp.   She had a unilateral thigh DVT in [place] many years ago when she was taking combined oral contraception.  She has not had any recurrences.  She did not undergo a work up for this.   She is at moderate to high risk of a DVT by the Caprini scoring.  She is 33, has a BMI of 45.24, is having a minor surgery, and has already had a DVT.   I was planning to give her Lovenox preop, but she has anaphylaxis with pork contact.   Are you able to make a recommendation for DVt prophylaxis for her?  Thank you for your consideration of this patient.   [name], MD Gynecology [place]

## 2020-10-16 NOTE — Telephone Encounter (Signed)
Call placed to Dr. Antonieta Pert office, spoke with nurse, relayed message per Dr. Quincy Simmonds. Will review with Dr. Marin Olp and f/u. Was advised patient will likely need OV for further recommendations, will return call.

## 2020-10-16 NOTE — Telephone Encounter (Signed)
Spoke with Lorriane Shire from Dr. Antonieta Pert office.   Was advised Dr. Marin Olp has read staff message from Dr. Quincy Simmonds and will respond.   Routing to Dr. Antony Blackbird.

## 2020-10-17 ENCOUNTER — Telehealth: Payer: Self-pay | Admitting: *Deleted

## 2020-10-17 LAB — SARS CORONAVIRUS 2 (TAT 6-24 HRS): SARS Coronavirus 2: NEGATIVE

## 2020-10-17 NOTE — Telephone Encounter (Signed)
Please confirm if patient was able to start her Xarelto 10 mg today from her local pharmacy.   Her Covid test is negative.

## 2020-10-17 NOTE — Telephone Encounter (Signed)
Patient aware to start today,her husband went to pickup Rx.

## 2020-10-17 NOTE — Telephone Encounter (Signed)
PA done on Cover my meds website for Xarelto 46m tablet medication was approved via [facility].  Pharmacy informed as well.

## 2020-10-20 ENCOUNTER — Ambulatory Visit (HOSPITAL_BASED_OUTPATIENT_CLINIC_OR_DEPARTMENT_OTHER): Payer: BC Managed Care – PPO | Admitting: Anesthesiology

## 2020-10-20 ENCOUNTER — Encounter (HOSPITAL_BASED_OUTPATIENT_CLINIC_OR_DEPARTMENT_OTHER): Payer: Self-pay | Admitting: Obstetrics and Gynecology

## 2020-10-20 ENCOUNTER — Other Ambulatory Visit: Payer: Self-pay

## 2020-10-20 ENCOUNTER — Ambulatory Visit (HOSPITAL_BASED_OUTPATIENT_CLINIC_OR_DEPARTMENT_OTHER)
Admission: RE | Admit: 2020-10-20 | Discharge: 2020-10-20 | Disposition: A | Discharge status: Home / Self Care | Payer: BC Managed Care – PPO | Attending: Obstetrics and Gynecology | Admitting: Obstetrics and Gynecology

## 2020-10-20 ENCOUNTER — Encounter (HOSPITAL_BASED_OUTPATIENT_CLINIC_OR_DEPARTMENT_OTHER)
Admission: RE | Disposition: A | Discharge status: Home / Self Care | Payer: Self-pay | Source: Home / Self Care | Attending: Obstetrics and Gynecology

## 2020-10-20 DIAGNOSIS — I10 Essential (primary) hypertension: Secondary | ICD-10-CM | POA: Insufficient documentation

## 2020-10-20 DIAGNOSIS — Z7984 Long term (current) use of oral hypoglycemic drugs: Secondary | ICD-10-CM | POA: Diagnosis not present

## 2020-10-20 DIAGNOSIS — E119 Type 2 diabetes mellitus without complications: Secondary | ICD-10-CM | POA: Diagnosis not present

## 2020-10-20 DIAGNOSIS — N959 Unspecified menopausal and perimenopausal disorder: Secondary | ICD-10-CM | POA: Diagnosis not present

## 2020-10-20 DIAGNOSIS — E063 Autoimmune thyroiditis: Secondary | ICD-10-CM | POA: Diagnosis not present

## 2020-10-20 DIAGNOSIS — N840 Polyp of corpus uteri: Secondary | ICD-10-CM | POA: Diagnosis not present

## 2020-10-20 DIAGNOSIS — Z8249 Family history of ischemic heart disease and other diseases of the circulatory system: Secondary | ICD-10-CM | POA: Insufficient documentation

## 2020-10-20 DIAGNOSIS — Z91040 Latex allergy status: Secondary | ICD-10-CM | POA: Diagnosis not present

## 2020-10-20 DIAGNOSIS — Z79899 Other long term (current) drug therapy: Secondary | ICD-10-CM | POA: Insufficient documentation

## 2020-10-20 DIAGNOSIS — Z86718 Personal history of other venous thrombosis and embolism: Secondary | ICD-10-CM | POA: Diagnosis not present

## 2020-10-20 DIAGNOSIS — N950 Postmenopausal bleeding: Secondary | ICD-10-CM | POA: Diagnosis not present

## 2020-10-20 HISTORY — PX: DILATATION & CURETTAGE/HYSTEROSCOPY WITH MYOSURE: SHX6511

## 2020-10-20 HISTORY — DX: Postmenopausal bleeding: N95.0

## 2020-10-20 HISTORY — DX: Prediabetes: R73.03

## 2020-10-20 HISTORY — DX: Gastro-esophageal reflux disease without esophagitis: K21.9

## 2020-10-20 HISTORY — DX: Unspecified osteoarthritis, unspecified site: M19.90

## 2020-10-20 HISTORY — DX: Mild persistent asthma, uncomplicated: J45.30

## 2020-10-20 HISTORY — DX: Dermatitis, unspecified: L30.9

## 2020-10-20 HISTORY — DX: Presence of spectacles and contact lenses: Z97.3

## 2020-10-20 HISTORY — DX: Polyp of corpus uteri: N84.0

## 2020-10-20 HISTORY — DX: Allergic rhinitis, unspecified: J30.9

## 2020-10-20 LAB — CBC
HCT: 40.4 % (ref 36.0–46.0)
Hemoglobin: 13.5 g/dL (ref 12.0–15.0)
MCH: 31.3 pg (ref 26.0–34.0)
MCHC: 33.4 g/dL (ref 30.0–36.0)
MCV: 93.7 fL (ref 80.0–100.0)
Platelets: 250 10*3/uL (ref 150–400)
RBC: 4.31 MIL/uL (ref 3.87–5.11)
RDW: 12.6 % (ref 11.5–15.5)
WBC: 6.1 10*3/uL (ref 4.0–10.5)
nRBC: 0 % (ref 0.0–0.2)

## 2020-10-20 LAB — POCT I-STAT, CHEM 8
BUN: 11 mg/dL (ref 6–20)
Calcium, Ion: 1.26 mmol/L (ref 1.15–1.40)
Chloride: 104 mmol/L (ref 98–111)
Creatinine, Ser: 0.7 mg/dL (ref 0.44–1.00)
Glucose, Bld: 103 mg/dL — ABNORMAL HIGH (ref 70–99)
HCT: 39 % (ref 36.0–46.0)
Hemoglobin: 13.3 g/dL (ref 12.0–15.0)
Potassium: 3.9 mmol/L (ref 3.5–5.1)
Sodium: 140 mmol/L (ref 135–145)
TCO2: 25 mmol/L (ref 22–32)

## 2020-10-20 LAB — ABO/RH: ABO/RH(D): O POS

## 2020-10-20 LAB — TYPE AND SCREEN
ABO/RH(D): O POS
Antibody Screen: NEGATIVE

## 2020-10-20 LAB — GLUCOSE, CAPILLARY: Glucose-Capillary: 103 mg/dL — ABNORMAL HIGH (ref 70–99)

## 2020-10-20 SURGERY — DILATATION & CURETTAGE/HYSTEROSCOPY WITH MYOSURE
Anesthesia: General | Site: Vagina

## 2020-10-20 MED ORDER — FENTANYL CITRATE (PF) 100 MCG/2ML IJ SOLN: 25.0000 ug | INTRAMUSCULAR | Status: DC | PRN

## 2020-10-20 MED ORDER — SILVER NITRATE-POT NITRATE 75-25 % EX MISC
CUTANEOUS | Status: DC | PRN
  Administered 2020-10-20: 2

## 2020-10-20 MED ORDER — ONDANSETRON HCL 4 MG/2ML IJ SOLN: 4.0000 mg | Freq: Once | INTRAMUSCULAR | Status: DC | PRN

## 2020-10-20 MED ORDER — LIDOCAINE HCL 1 % IJ SOLN
INTRAMUSCULAR | Status: DC | PRN
  Administered 2020-10-20: 10 mL

## 2020-10-20 MED ORDER — DEXAMETHASONE SODIUM PHOSPHATE 10 MG/ML IJ SOLN
INTRAMUSCULAR | Status: DC | PRN
  Administered 2020-10-20: 8 mg via INTRAVENOUS

## 2020-10-20 MED ORDER — ONDANSETRON HCL 4 MG/2ML IJ SOLN
INTRAMUSCULAR | Status: AC
  Filled 2020-10-20: qty 2

## 2020-10-20 MED ORDER — PROPOFOL 10 MG/ML IV BOLUS
INTRAVENOUS | Status: AC
  Filled 2020-10-20: qty 40

## 2020-10-20 MED ORDER — SCOPOLAMINE 1 MG/3DAYS TD PT72
1.0000 | MEDICATED_PATCH | TRANSDERMAL | Status: DC
  Administered 2020-10-20: 1.5 mg via TRANSDERMAL

## 2020-10-20 MED ORDER — DEXAMETHASONE SODIUM PHOSPHATE 10 MG/ML IJ SOLN
INTRAMUSCULAR | Status: AC
  Filled 2020-10-20: qty 1

## 2020-10-20 MED ORDER — OXYCODONE HCL 5 MG PO TABS: 5.0000 mg | ORAL_TABLET | Freq: Once | ORAL | Status: DC | PRN

## 2020-10-20 MED ORDER — FERRIC SUBSULFATE 259 MG/GM EX SOLN
CUTANEOUS | Status: DC | PRN
  Administered 2020-10-20: 1

## 2020-10-20 MED ORDER — FENTANYL CITRATE (PF) 100 MCG/2ML IJ SOLN
INTRAMUSCULAR | Status: AC
  Filled 2020-10-20: qty 2

## 2020-10-20 MED ORDER — ACETAMINOPHEN 500 MG PO TABS: 1000.0000 mg | ORAL_TABLET | Freq: Four times a day (QID) | ORAL | 0 refills | Status: AC | PRN

## 2020-10-20 MED ORDER — PROPOFOL 10 MG/ML IV BOLUS
INTRAVENOUS | Status: DC | PRN
  Administered 2020-10-20: 50 mg via INTRAVENOUS
  Administered 2020-10-20: 250 mg via INTRAVENOUS
  Administered 2020-10-20: 50 mg via INTRAVENOUS

## 2020-10-20 MED ORDER — ONDANSETRON HCL 4 MG/2ML IJ SOLN
INTRAMUSCULAR | Status: DC | PRN
  Administered 2020-10-20: 4 mg via INTRAVENOUS

## 2020-10-20 MED ORDER — LACTATED RINGERS IV SOLN: INTRAVENOUS | Status: DC

## 2020-10-20 MED ORDER — KETOROLAC TROMETHAMINE 30 MG/ML IJ SOLN
INTRAMUSCULAR | Status: AC
  Filled 2020-10-20: qty 1

## 2020-10-20 MED ORDER — LIDOCAINE 2% (20 MG/ML) 5 ML SYRINGE
INTRAMUSCULAR | Status: AC
  Filled 2020-10-20: qty 5

## 2020-10-20 MED ORDER — MIDAZOLAM HCL 2 MG/2ML IJ SOLN
INTRAMUSCULAR | Status: AC
  Filled 2020-10-20: qty 2

## 2020-10-20 MED ORDER — LIDOCAINE 2% (20 MG/ML) 5 ML SYRINGE
INTRAMUSCULAR | Status: DC | PRN
  Administered 2020-10-20: 100 mg via INTRAVENOUS

## 2020-10-20 MED ORDER — OXYCODONE HCL 5 MG/5ML PO SOLN: 5.0000 mg | Freq: Once | ORAL | Status: DC | PRN

## 2020-10-20 MED ORDER — MIDAZOLAM HCL 2 MG/2ML IJ SOLN
INTRAMUSCULAR | Status: DC | PRN
  Administered 2020-10-20: 2 mg via INTRAVENOUS

## 2020-10-20 MED ORDER — FENTANYL CITRATE (PF) 100 MCG/2ML IJ SOLN
INTRAMUSCULAR | Status: DC | PRN
  Administered 2020-10-20 (×2): 25 ug via INTRAVENOUS

## 2020-10-20 MED ORDER — SCOPOLAMINE 1 MG/3DAYS TD PT72
MEDICATED_PATCH | TRANSDERMAL | Status: AC
  Filled 2020-10-20: qty 1

## 2020-10-20 MED ORDER — SODIUM CHLORIDE 0.9 % IR SOLN
Status: DC | PRN
  Administered 2020-10-20: 3000 mL

## 2020-10-20 SURGICAL SUPPLY — 22 items
CATH ROBINSON RED A/P 16FR (CATHETERS) ×2 IMPLANT
COVER WAND RF STERILE (DRAPES) ×2 IMPLANT
DEVICE MYOSURE LITE (MISCELLANEOUS) IMPLANT
DEVICE MYOSURE REACH (MISCELLANEOUS) ×2 IMPLANT
DILATOR CANAL MILEX (MISCELLANEOUS) IMPLANT
GAUZE 4X4 16PLY RFD (DISPOSABLE) ×2 IMPLANT
GLOVE SURG ENC MOIS LTX SZ6.5 (GLOVE) ×2 IMPLANT
GLOVE SURG UNDER POLY LF SZ6.5 (GLOVE) ×4 IMPLANT
GLOVE SURG UNDER POLY LF SZ7.5 (GLOVE) ×2 IMPLANT
GOWN STRL REUS W/ TWL LRG LVL3 (GOWN DISPOSABLE) ×1 IMPLANT
GOWN STRL REUS W/TWL LRG LVL3 (GOWN DISPOSABLE) ×4 IMPLANT
IV NS IRRIG 3000ML ARTHROMATIC (IV SOLUTION) ×2 IMPLANT
KIT PROCEDURE FLUENT (KITS) ×2 IMPLANT
KIT TURNOVER CYSTO (KITS) ×2 IMPLANT
MYOSURE XL FIBROID (MISCELLANEOUS)
PACK VAGINAL MINOR WOMEN LF (CUSTOM PROCEDURE TRAY) ×2 IMPLANT
PAD OB MATERNITY 4.3X12.25 (PERSONAL CARE ITEMS) ×2 IMPLANT
SCOPETTES 8  STERILE (MISCELLANEOUS) ×2
SCOPETTES 8 STERILE (MISCELLANEOUS) ×1 IMPLANT
SEAL CERVICAL OMNI LOK (ABLATOR) IMPLANT
SEAL ROD LENS SCOPE MYOSURE (ABLATOR) ×2 IMPLANT
SYSTEM TISS REMOVAL MYOSURE XL (MISCELLANEOUS) IMPLANT

## 2020-10-20 NOTE — Anesthesia Postprocedure Evaluation (Signed)
Anesthesia Post Note  Patient: Monica Good  Procedure(s) Performed: DILATATION & CURETTAGE/HYSTEROSCOPY WITH MYOSURE polypectomy (N/A Vagina )     Patient location during evaluation: PACU Anesthesia Type: General Level of consciousness: awake and alert and oriented Pain management: pain level controlled Vital Signs Assessment: post-procedure vital signs reviewed and stable Respiratory status: spontaneous breathing, nonlabored ventilation and respiratory function stable Cardiovascular status: blood pressure returned to baseline and stable Postop Assessment: no apparent nausea or vomiting Anesthetic complications: no   No complications documented.  Last Vitals:  Vitals:   10/20/20 1100 10/20/20 1115  BP: 118/64 (!) 117/54  Pulse: 70 67  Resp: 14 13  Temp:    SpO2: 97% 94%    Last Pain:  Vitals:   10/20/20 1100  TempSrc:   PainSc: 0-No pain                 Karem Tomaso A.

## 2020-10-20 NOTE — Discharge Instructions (Signed)
Dilation and Curettage or Vacuum Curettage, Care After This sheet gives you information about how to care for yourself after your procedure. Your doctor may also give you more specific instructions. If you have problems or questions, contact your doctor. What can I expect after the procedure? After the procedure, it is common to have:  Mild pain or cramping.  Some bleeding or spotting from the vagina. These may last for up to 2 weeks. Follow these instructions at home: Medicines  Take over-the-counter and prescription medicines only as told by your doctor. This is very important if you take blood-thinning medicine.  Ask your doctor if the medicine prescribed to you requires you to avoid driving or using machinery. Activity  If you were given a medicine to help you relax (sedative) during your procedure, it can affect you for many hours. Do not drive or use machinery until your doctor says that it is safe.  Rest as told by your doctor.  Do not sit for a long time without moving. Get up to take short walks every 1-2 hours. This is important. Ask for help if you feel weak or unsteady.  Do not lift anything that is heavier than 10 lb (4.5 kg), or the limit that you are told, until your doctor says that it is safe.  Return to your normal activities as told by your doctor. Ask your doctor what activities are safe for you.   Lifestyle For at least 2 weeks, or as long as told by your doctor:  Do not douche.  Do not use tampons.  Do not have sex. General instructions  Wear compression stockings as told by your doctor.  It is up to you to get the results of your procedure. Ask your doctor, or the department that is doing the procedure, when your results will be ready.  Keep all follow-up visits as told by your doctor. This is important. Contact a doctor if:  You have very bad cramps that get worse or do not get better with medicine.  You have very bad pain in your belly  (abdomen).  You cannot drink fluids without vomiting.  You have pain in a different part of your pelvis. The pelvis is the area just above your thighs.  You have fluid from your vagina that smells bad.  You have a rash. Get help right away if:  You are bleeding a lot from your vagina. A lot of bleeding means soaking more than one sanitary pad in 1 hour for 2 hours in a row.  You have a fever that is above 100.31F (38.0C).  Your belly feels very tender or hard.  You have chest pain.  You have trouble breathing.  You feel dizzy.  You feel light-headed.  You pass out (faint).  You have pain in your neck or shoulder area. These symptoms may be an emergency. Do not wait to see if the symptoms will go away. Get medical help right away. Call your local emergency services (911 in the [place] Do not drive yourself to the hospital. Summary  After your procedure, it is common to have pain or cramping. It is also common to have bleeding or spotting from your vagina.  Rest as told. Do not sit for a long time without moving. Get up to take short walks every 1-2 hours.  Do not lift anything that is heavier than 10 lb (4.5 kg), or the limit that you are told.  Contact your doctor if you have fluid from  your vagina that smells bad.  Get help right away if you develop any problems from the procedure. Ask your doctor what problems to watch for. This information is not intended to replace advice given to you by your health care provider. Make sure you discuss any questions you have with your health care provider. Document Revised: 07/03/2019 Document Reviewed: 07/03/2019 Elsevier Patient Education  2021 [place]. Hysteroscopy Hysteroscopy is a procedure used to look inside a woman's womb (uterus). This may be done for various reasons, including:  To look for tumors and other growths in the uterus.  To evaluate abnormal bleeding, fibroid tumors, polyps, scar tissue, or uterine  cancer.  To determine why a woman is unable to get pregnant or has had repeated pregnancy losses.  To locate an IUD (intrauterine device).  To place a birth control device into the fallopian tubes. During this procedure, a thin, flexible tube with a small light and camera (hysteroscope) is used to examine the uterus. The camera sends images to a monitor in the room so that your health care provider can view the inside of your uterus. A hysteroscopy should be done right after a menstrual period. Tell a health care provider about:  Any allergies you have.  All medicines you are taking, including vitamins, herbs, eye drops, creams, and over-the-counter medicines.  Any problems you or family members have had with anesthetic medicines.  Any blood disorders you have.  Any surgeries you have had.  Any medical conditions you have.  Whether you are pregnant or may be pregnant.  Whether you have been diagnosed with an STI (sexually transmitted infection) or you think you have an STI. What are the risks? Generally, this is a safe procedure. However, problems may occur, including:  Excessive bleeding.  Infection.  Damage to the uterus or other structures or organs.  Allergic reaction to medicines or fluids that are used in the procedure. What happens before the procedure? Staying hydrated Follow instructions from your health care provider about hydration, which may include:  Up to 2 hours before the procedure - you may continue to drink clear liquids, such as water, clear fruit juice, black coffee, and plain tea. Eating and drinking restrictions Follow instructions from your health care provider about eating and drinking, which may include:  8 hours before the procedure - stop eating solid foods and drink clear liquids only.  2 hours before the procedure - stop drinking clear liquids. Medicines  Ask your health care provider about: ? Changing or stopping your regular medicines.  This is especially important if you are taking diabetes medicines or blood thinners. ? Taking medicines such as aspirin and ibuprofen. These medicines can thin your blood. Do not take these medicines unless your health care provider tells you to take them. ? Taking over-the-counter medicines, vitamins, herbs, and supplements.  Medicine may be placed in your cervix the day before the procedure. This medicine causes the cervix to open (dilate). The larger opening makes it easier for the hysteroscope to be inserted into the uterus during the procedure. General instructions  Ask your health care provider: ? What steps will be taken to help prevent infection. These steps may include:  Washing skin with a germ-killing soap.  Taking antibiotic medicine.  Do not use any products that contain nicotine or tobacco for at least 4 weeks before the procedure. These products include cigarettes, chewing tobacco, and vaping devices, such as e-cigarettes. If you need help quitting, ask your health care provider.  Plan to have a responsible adult take you home from the hospital or clinic.  Plan to have a responsible adult care for you for the time you are told after you leave the hospital or clinic. This is important.  Empty your bladder before the procedure begins. What happens during the procedure?  An IV will be inserted into one of your veins.  You may be given: ? A medicine to help you relax (sedative). ? A medicine that numbs the area around the cervix (local anesthetic). ? A medicine to make you fall asleep (general anesthetic).  A hysteroscope will be inserted through your vagina and into your uterus.  Air or fluid will be used to enlarge your uterus to allow your health care provider to see it better. The amount of fluid used will be carefully checked throughout the procedure.  In some cases, tissue may be gently scraped from inside the uterus and sent to a lab for testing (biopsy). The  procedure may vary among health care providers and hospitals. What can I expect after the procedure?  Your blood pressure, heart rate, breathing rate, and blood oxygen level will be monitored until you leave the hospital or clinic.  You may have cramps. You may be given medicines for this.  You may have bleeding, which may vary from light spotting to menstrual-like bleeding. This is normal.  If you had a biopsy, it is up to you to get the results. Ask your health care provider, or the department that is doing the procedure, when your results will be ready. Follow these instructions at home: Activity  Rest as told by your health care provider.  Return to your normal activities as told by your health care provider. Ask your health care provider what activities are safe for you.  If you were given a sedative during the procedure, it can affect you for several hours. Do not drive or operate machinery until your health care provider says that it is safe. Medicines  Do not take aspirin or other NSAIDs during recovery, as told by your healthcare provider. It can increase the risk of bleeding.  Ask your health care provider if the medicine prescribed to you: ? Requires you to avoid driving or using machinery. ? Can cause constipation. You may need to take these actions to prevent or treat constipation:  Drink enough fluid to keep your urine pale yellow.  Take over-the-counter or prescription medicines.  Eat foods that are high in fiber, such as beans, whole grains, and fresh fruits and vegetables.  Limit foods that are high in fat and processed sugars, such as fried or sweet foods. General instructions  Do not douche, use tampons, or have sex for 2 weeks after the procedure, or until your health care provider approves.  Do not take baths, swim, or use a hot tub until your health care provider approves. Take showers instead of baths for 2 weeks, or for as long as told by your health care  provider.  Keep all follow-up visits. This is important. Contact a health care provider if:  You feel dizzy or lightheaded.  You feel nauseous.  You have abnormal vaginal discharge.  You have a rash.  You have pain that does not get better with medicine.  You have chills. Get help right away if:  You have bleeding that is heavier than a normal menstrual period.  You have a fever.  You have pain or cramps that get worse.  You develop new abdominal  pain.  You faint.  You have pain in your shoulder.  You are short of breath. Summary  Hysteroscopy is a procedure that is used to look inside a woman's womb (uterus).  After the procedure, you may have bleeding, which varies from light spotting to menstrual-like bleeding. This is normal. You may also have cramps.  Do not douche, use tampons, or have sex for 2 weeks after the procedure, or until your health care provider approves.  Plan to have a responsible adult take you home from the hospital or clinic. This information is not intended to replace advice given to you by your health care provider. Make sure you discuss any questions you have with your health care provider. Document Revised: 01/16/2020 Document Reviewed: 01/16/2020 Elsevier Patient Education  2021 [place] Instructions  Activity: Get plenty of rest for the remainder of the day. A responsible individual must stay with you for 24 hours following the procedure.  For the next 24 hours, DO NOT: -Drive a car -Paediatric nurse -Drink alcoholic beverages -Take any medication unless instructed by your physician -Make any legal decisions or sign important papers.  Meals: Start with liquid foods such as gelatin or soup. Progress to regular foods as tolerated. Avoid greasy, spicy, heavy foods. If nausea and/or vomiting occur, drink only clear liquids until the nausea and/or vomiting subsides. Call your physician if vomiting  continues.  Special Instructions/Symptoms: Your throat may feel dry or sore from the anesthesia or the breathing tube placed in your throat during surgery. If this causes discomfort, gargle with warm salt water. The discomfort should disappear within 24 hours.  If you had a scopolamine patch placed behind your ear for the management of post- operative nausea and/or vomiting:  1. The medication in the patch is effective for 72 hours, after which it should be removed.  Wrap patch in a tissue and discard in the trash. Wash hands thoroughly with soap and water. 2. You may remove the patch earlier than 72 hours if you experience unpleasant side effects which may include dry mouth, dizziness or visual disturbances. 3. Avoid touching the patch. Wash your hands with soap and water after contact with the patch. 4. Remove patch by 10/23/2020

## 2020-10-20 NOTE — Op Note (Signed)
OPERATIVE REPORT  PREOPERATIVE DIAGNOSES:  Postmenopausal bleeding, endometrial polyp.  POSTOPERATIVE DIAGNOSES:  Postmenopausal bleeding, endometrial polyp.   PROCEDURE:  Hysteroscopy with dilation and curettage and Myosure resection of endometrial polyp.  SURGEON:  Lenard Galloway, MD  ANESTHESIA:  LMA, paracervical block with 10 mL of 1% lidocaine.  IV FLUIDS:  700 cc LR  EBL:  10 cc  URINE OUTPUT:  100 cc   NORMAL SALINE DEFICIT:   95 cc  COMPLICATIONS:  None.  INDICATIONS FOR THE PROCEDURE:     The patient is a 55 year old [id number] Caucasian female who presents with postmenopausal bleeding.   Pelvic ultrasound showed an endometrial stripe of 8.11 mm.  Her initial endometrial biopsy showed endocervical glandular tissue with microglandular adenosis, atypical squamous epithelium, possible LGSIL.  Her pap on 07/03/20 was normal with a negative high risk HPV test.  Repeat endometrial biopsy showed a benign polyp and ECC showed benign squamous and endocervical mucosa.   A plan is now made to proceed with a hysteroscopy with dilation and curettage and Myosurgical resection of the endometrial polyp; after risks, benefits and alternatives were reviewed.  FINDINGS:  Exam under anesthesia revealed a small anteverted, mobile uterus.  No adnexal masses were noted.  The uterus was sounded to 7 cm.  Hysteroscopy showed a 7 mm polyp in the left cornual region.  The right tubal ostia region was normal and the left tubal ostial region was concealed by the polyp.  Endometrial currettings were scant.  SPECIMENS:   The endometrial polyp and the endometrial curettings were sent to pathology separately.   PROCEDURE IN DETAIL:  The patient was reidentified in the preoperative hold area.  She received TED hose and PAS stockings for DVT prophylaxis.  In the operating room, the patient was placed in the dorsal lithotomy position and then an LMA anesthetic was introduced.  The patient's lower abdomen,  vagina and perineum were sterilely prepped with Hibiblens and the  patient's bladder was catheterized of urine.  She was sterilly draped  An exam under anesthesia was performed.  A speculum was placed inside the vagina and a single-tooth tenaculum was placed on the anterior cervical lip.  A paracervical block was performed with a total of 10 mL of 1% lidocaine plain.  The uterus was sounded. The cervix was dilated to a #19 Pratt dilator.  The MyoSure hysteroscope was then inserted inside the uterine cavity under the continuous infusion of normal saline solution.   Findings are as noted above.  The Myosure Reach device was used to resect the endometrial polyp without difficulty.  The MyoSure hysteroscope was removed after the polyp was resected.  The cervix was further dilated to a #23 Pratt dilator.   The serrated curette was introduced into the uterine cavity and the endometrium was curetted in all 4 quadrants.   A minimal amount of endometrial curettings was obtained.  This specimen was sent to Pathology.  The single-tooth tenaculum which had been placed on the anterior cervical lip was removed.     There was bleeding from the tenaculum site, and this was treated with silver nitrate and Monsel's solution.  Hemostasis was then good, and all of the vaginal instruments were removed.  The patient was awakened and escorted to the recovery room in stable condition. There were no complications to the procedure.   All needle, instrument and sponge counts were correct.  Lenard Galloway, MD

## 2020-10-20 NOTE — Anesthesia Procedure Notes (Signed)
Procedure Name: LMA Insertion Date/Time: 10/20/2020 9:45 AM Performed by: Bonney Aid, CRNA Pre-anesthesia Checklist: Patient identified, Emergency Drugs available, Suction available and Patient being monitored Patient Re-evaluated:Patient Re-evaluated prior to induction Oxygen Delivery Method: Circle system utilized Preoxygenation: Pre-oxygenation with 100% oxygen Induction Type: IV induction Ventilation: Mask ventilation without difficulty LMA: LMA inserted LMA Size: 4.0 Number of attempts: 1 Airway Equipment and Method: Bite block Placement Confirmation: positive ETCO2 Tube secured with: Tape Dental Injury: Teeth and Oropharynx as per pre-operative assessment

## 2020-10-20 NOTE — Anesthesia Preprocedure Evaluation (Addendum)
Anesthesia Evaluation  Patient identified by MRN, date of birth, ID band Patient awake    Reviewed: Allergy & Precautions, NPO status , Patient's Chart, lab work & pertinent test results  Airway Mallampati: III  TM Distance: >3 FB Neck ROM: Full    Dental no notable dental hx. (+) Teeth Intact, Caps, Dental Advisory Given   Pulmonary asthma ,    Pulmonary exam normal breath sounds clear to auscultation       Cardiovascular hypertension, Pt. on medications Normal cardiovascular exam Rhythm:Regular Rate:Normal     Neuro/Psych negative neurological ROS  negative psych ROS   GI/Hepatic Neg liver ROS, GERD  Medicated and Controlled,  Endo/Other  diabetes, Well Controlled, Type 2, Oral Hypoglycemic AgentsMorbid obesity  Renal/GU negative Renal ROS  negative genitourinary   Musculoskeletal  (+) Arthritis , Osteoarthritis,  Eczema   Abdominal (+) + obese,   Peds  Hematology Xarelto therapy- last dose this am   Anesthesia Other Findings   Reproductive/Obstetrics                            Anesthesia Physical Anesthesia Plan  ASA: III  Anesthesia Plan: General   Post-op Pain Management:    Induction: Intravenous  PONV Risk Score and Plan: 4 or greater and Treatment may vary due to age or medical condition, Scopolamine patch - Pre-op, Midazolam, Ondansetron and Dexamethasone  Airway Management Planned: LMA  Additional Equipment:   Intra-op Plan:   Post-operative Plan: Extubation in OR  Informed Consent: I have reviewed the patients History and Physical, chart, labs and discussed the procedure including the risks, benefits and alternatives for the proposed anesthesia with the patient or authorized representative who has indicated his/her understanding and acceptance.     Dental advisory given  Plan Discussed with: Anesthesiologist  Anesthesia Plan Comments:         Anesthesia  Quick Evaluation

## 2020-10-20 NOTE — Progress Notes (Signed)
Update to History and Physical  Upon recommendation of hematology/oncology, patient using Xarelto for DVT prophylaxis due to pork allergy and contraindication for use of heparin or Lovenox.  She took Xarelto 10 mg on 10/17/20 and 10/18/20.  She will begin Xarelto 10 mg again on 10/21/20 if bleeding is controlled, and she will take this daily for 10 days.   Patient examined.  OK to proceed with surgery.

## 2020-10-20 NOTE — Transfer of Care (Signed)
Immediate Anesthesia Transfer of Care Note  Patient: Monica Good  Procedure(s) Performed: Procedure(s) (LRB): DILATATION & CURETTAGE/HYSTEROSCOPY WITH MYOSURE polypectomy (N/A)  Patient Location: PACU  Anesthesia Type: General  Level of Consciousness: awake, alert  and oriented  Airway & Oxygen Therapy: Patient Spontanous Breathing and Patient connected to face mask oxygen  Post-op Assessment: Report given to PACU RN and Post -op Vital signs reviewed and stable  Post vital signs: Reviewed and stable  Complications: No apparent anesthesia complications  Last Vitals:  Vitals Value Taken Time  BP 130/76 10/20/20 1030  Temp    Pulse 67 10/20/20 1030  Resp 16 10/20/20 1030  SpO2 94 % 10/20/20 1030  Vitals shown include unvalidated device data.  Last Pain:  Vitals:   10/20/20 0801  TempSrc: Oral  PainSc: 0-No pain         Complications: No complications documented.

## 2020-10-21 ENCOUNTER — Encounter (HOSPITAL_BASED_OUTPATIENT_CLINIC_OR_DEPARTMENT_OTHER): Payer: Self-pay | Admitting: Obstetrics and Gynecology

## 2020-10-21 LAB — SURGICAL PATHOLOGY

## 2020-10-27 NOTE — Progress Notes (Signed)
GYNECOLOGY  VISIT   HPI: 55 y.o.   Married  Caucasian  female   [phone] with Patient's last menstrual period was 06/14/2016 (approximate).   here for follow up  Assumption polypectomy (N/A Vagina ). Pathology - benign cervical polyp.  Bleeding very light now.  No cramping.   Has a lingering cough following surgery.   Has a couple more days of Xarelto to take for DVT prophylaxis.  Asking about annual exam which is due this summer.   GYNECOLOGIC HISTORY: Patient's last menstrual period was 06/14/2016 (approximate). Contraception:  PMP Menopausal hormone therapy:  none Last mammogram:  08-29-20 Neg/BiRads1 Last pap smear: 07/03/20 Neg:HR HPV neg, 12-07-18 Neg:Neg HR HPV, 11-07-15 Neg:Neg HR HPV        OB History    Gravida  4   Para  4   Term  4   Preterm      AB      Living  4     SAB      IAB      Ectopic      Multiple      Live Births  4              Patient Active Problem List   Diagnosis Date Noted  . Coughing 11/24/2017  . Globus sensation 11/24/2017  . Laryngopharyngeal reflux (LPR) 11/24/2017  . Allergic reaction 03/02/2017  . Contact dermatitis due to chemicals 09/22/2016  . Allergic rhinitis 09/01/2015  . Mild persistent asthma 09/01/2015  . Food Allergy 09/01/2015  . Cystocele 11/02/2013    Past Medical History:  Diagnosis Date  . Allergic rhinitis   . Arthritis   . Eczema   . Endometrial polyp   . GERD (gastroesophageal reflux disease)    prn tums and watches diet  . Hashimoto's disease    followed by dr Modena Nunnery,  takes low dose naltrexone  . History of DVT of lower extremity 1995   left thigh,  completed one year blood thinner  . Hypertension    followed by pcp  . Mild persistent asthma    followed [facility] dr p. padgett [facility]  . PMB (postmenopausal bleeding)   . Pre-diabetes   . Wears glasses     Past Surgical History:  Procedure Laterality Date  . DILATATION &  CURETTAGE/HYSTEROSCOPY WITH MYOSURE N/A 10/20/2020   Procedure: DILATATION & CURETTAGE/HYSTEROSCOPY WITH MYOSURE polypectomy;  Surgeon: Nunzio Cobbs, MD;  Location: [facility];  Service: Gynecology;  Laterality: N/A;  . HAND SURGERY Right 2006  @[facility]    abscess  . KNEE ARTHROSCOPY Left 04-27-2010   @[facility]   . LAPAROSCOPY  1991   diagnostic   . SHOULDER ARTHROSCOPY Right 2004    Current Outpatient Medications  Medication Sig Dispense Refill  . acetaminophen (TYLENOL) 500 MG tablet Take 2 tablets (1,000 mg total) by mouth every 6 (six) hours as needed. 100 tablet 0  . Acetylcysteine (NAC PO) Take 900 mg by mouth 2 (two) times daily.    Marland Kitchen albuterol (PROVENTIL HFA) 108 (90 Base) MCG/ACT inhaler Inhale 2 puffs into the lungs every 4 (four) hours as needed. (Patient taking differently: Inhale 2 puffs into the lungs every 4 (four) hours as needed.) 18 g 1  . ARNUITY ELLIPTA 100 MCG/ACT AEPB Inhale 1 puff into the lungs daily.    . ASCORBIC ACID BUFFERED PO Take 1 capsule by mouth daily.    Marland Kitchen buPROPion (WELLBUTRIN XL) 300 MG  24 hr tablet Take 300 mg by mouth at bedtime.    . calcium carbonate (TUMS - DOSED IN MG ELEMENTAL CALCIUM) 500 MG chewable tablet Chew 1 tablet by mouth as needed for indigestion or heartburn.    Marland Kitchen EPINEPHrine 0.3 mg/0.3 mL IJ SOAJ injection Inject 0.3 mg into the muscle as needed.    . fluticasone furoate-vilanterol (BREO ELLIPTA) 100-25 MCG/INH AEPB Inhale 1 puff into the lungs daily.    Marland Kitchen losartan (COZAAR) 50 MG tablet Take 50 mg by mouth 2 (two) times daily.  5  . Magnesium (OPTIMAG 125 PO) Take 2 capsules by mouth 2 (two) times daily.    . Melatonin 5 MG CAPS Take 1 capsule by mouth at bedtime as needed.    . metFORMIN (GLUCOPHAGE) 500 MG tablet Take 500 mg by mouth 2 (two) times daily with a meal.    . Misc Natural Products (T-RELIEF CBD+13 SL) Place 600 mg under the tongue daily. Hemp/ cbd one under tongue daily    . modafinil (PROVIGIL) 100 MG  tablet Take 100 mg by mouth daily as needed (takes as needed at work / during the day).    . Multiple Vitamin (MULTIVITAMIN) capsule Take 1 capsule by mouth daily.    Marland Kitchen NALTREXONE HCL PO Take 4.5 mg by mouth 2 (two) times daily.    Marland Kitchen PRESCRIPTION MEDICATION every 30 (thirty) days. Allergy shots    . Probiotic Product (PROBIOTIC DAILY) CAPS Take 1 capsule by mouth daily.    . rivaroxaban (XARELTO) 10 MG TABS tablet Take 1 tablet (10 mg total) by mouth daily. Do not take medication the day prior to surgery or the morning of surgery.  You will likely resume the medication the day following surgery and take for 10 days. 14 tablet 0  . UNABLE TO FIND daily. 5HTP CR 148m 1 tab  daily    . Vitamin A 3 MG (10000 UT) TABS Take 1 capsule by mouth 2 (two) times a week.    . Vitamin D, Ergocalciferol, (DRISDOL) 50000 units CAPS capsule Take 50,000 Units by mouth every 7 (seven) days. Monday's     No current facility-administered medications for this visit.     ALLERGIES: Beef-derived products, Fish allergy, Lambs quarters, Other, Pork-derived products, Shellfish allergy, Bee venom, Latex, and Peanut oil  Family History  Problem Relation Age of Onset  . Arthritis Mother   . Hypertension Mother   . Diabetes Mother   . Allergic rhinitis Neg Hx   . Angioedema Neg Hx   . Eczema Neg Hx   . Asthma Neg Hx   . Immunodeficiency Neg Hx   . Urticaria Neg Hx     Social History   Socioeconomic History  . Marital status: Married    Spouse name: Not on file  . Number of children: Not on file  . Years of education: Not on file  . Highest education level: Not on file  Occupational History  . Not on file  Tobacco Use  . Smoking status: Never Smoker  . Smokeless tobacco: Never Used  Vaping Use  . Vaping Use: Never used  Substance and Sexual Activity  . Alcohol use: Not Currently    Comment: very rare  . Drug use: Never  . Sexual activity: Yes    Partners: Male    Birth control/protection:  Post-menopausal  Other Topics Concern  . Not on file  Social History Narrative  . Not on file   Social Determinants of Health  Financial Resource Strain: Not on file  Food Insecurity: Not on file  Transportation Needs: Not on file  Physical Activity: Not on file  Stress: Not on file  Social Connections: Not on file  Intimate Partner Violence: Not on file    Review of Systems  All other systems reviewed and are negative.   PHYSICAL EXAMINATION:    BP 120/72   Pulse 82   Ht 5' 7.25" (1.708 m)   Wt 291 lb (132 kg)   LMP 06/14/2016 (Approximate)   SpO2 95%   BMI 45.24 kg/m     General appearance: alert, cooperative and appears stated age   Pelvic: External genitalia:  no lesions              Urethra:  normal appearing urethra with no masses, tenderness or lesions              Bartholins and Skenes: normal                 Vagina: normal appearing vagina with normal color and discharge, no lesions              Cervix:  Post surgical changes noted - atrophy.                 Bimanual Exam:  Uterus:  normal size, contour, position, consistency, mobility, non-tender              Adnexa: no mass, fullness, tenderness             Chaperone was present for exam.  ASSESSMENT  Status post hysteroscopic polypectomy, dilation and curettage.   PLAN  Surgical findings, procedure, and pathology report reviewed.  Finish course of Xarelto.  Call for future vaginal bleeding.  Ok to move annual exam to the fall, 2022.

## 2020-10-28 ENCOUNTER — Other Ambulatory Visit: Payer: Self-pay

## 2020-10-28 ENCOUNTER — Encounter: Payer: Self-pay | Admitting: Obstetrics and Gynecology

## 2020-10-28 ENCOUNTER — Ambulatory Visit (INDEPENDENT_AMBULATORY_CARE_PROVIDER_SITE_OTHER): Payer: BC Managed Care – PPO | Admitting: Obstetrics and Gynecology

## 2020-10-28 VITALS — BP 120/72 | HR 82 | Ht 67.25 in | Wt 291.0 lb

## 2020-10-28 DIAGNOSIS — M9901 Segmental and somatic dysfunction of cervical region: Secondary | ICD-10-CM | POA: Diagnosis not present

## 2020-10-28 DIAGNOSIS — Z9889 Other specified postprocedural states: Secondary | ICD-10-CM

## 2020-10-28 DIAGNOSIS — M9905 Segmental and somatic dysfunction of pelvic region: Secondary | ICD-10-CM | POA: Diagnosis not present

## 2020-10-28 DIAGNOSIS — M9903 Segmental and somatic dysfunction of lumbar region: Secondary | ICD-10-CM | POA: Diagnosis not present

## 2020-10-28 DIAGNOSIS — M9902 Segmental and somatic dysfunction of thoracic region: Secondary | ICD-10-CM | POA: Diagnosis not present

## 2020-10-30 ENCOUNTER — Ambulatory Visit: Payer: Self-pay | Admitting: Obstetrics and Gynecology

## 2020-11-04 ENCOUNTER — Ambulatory Visit (INDEPENDENT_AMBULATORY_CARE_PROVIDER_SITE_OTHER): Payer: BC Managed Care – PPO | Admitting: *Deleted

## 2020-11-04 DIAGNOSIS — J309 Allergic rhinitis, unspecified: Secondary | ICD-10-CM

## 2020-11-11 DIAGNOSIS — M9901 Segmental and somatic dysfunction of cervical region: Secondary | ICD-10-CM | POA: Diagnosis not present

## 2020-11-11 DIAGNOSIS — M9903 Segmental and somatic dysfunction of lumbar region: Secondary | ICD-10-CM | POA: Diagnosis not present

## 2020-11-11 DIAGNOSIS — M9905 Segmental and somatic dysfunction of pelvic region: Secondary | ICD-10-CM | POA: Diagnosis not present

## 2020-11-11 DIAGNOSIS — M9902 Segmental and somatic dysfunction of thoracic region: Secondary | ICD-10-CM | POA: Diagnosis not present

## 2020-12-02 ENCOUNTER — Ambulatory Visit (INDEPENDENT_AMBULATORY_CARE_PROVIDER_SITE_OTHER): Payer: BC Managed Care – PPO | Admitting: *Deleted

## 2020-12-02 DIAGNOSIS — J309 Allergic rhinitis, unspecified: Secondary | ICD-10-CM

## 2020-12-09 ENCOUNTER — Ambulatory Visit (INDEPENDENT_AMBULATORY_CARE_PROVIDER_SITE_OTHER): Payer: BC Managed Care – PPO

## 2020-12-09 DIAGNOSIS — M9902 Segmental and somatic dysfunction of thoracic region: Secondary | ICD-10-CM | POA: Diagnosis not present

## 2020-12-09 DIAGNOSIS — M9903 Segmental and somatic dysfunction of lumbar region: Secondary | ICD-10-CM | POA: Diagnosis not present

## 2020-12-09 DIAGNOSIS — M9905 Segmental and somatic dysfunction of pelvic region: Secondary | ICD-10-CM | POA: Diagnosis not present

## 2020-12-09 DIAGNOSIS — M9901 Segmental and somatic dysfunction of cervical region: Secondary | ICD-10-CM | POA: Diagnosis not present

## 2020-12-09 DIAGNOSIS — J309 Allergic rhinitis, unspecified: Secondary | ICD-10-CM

## 2020-12-09 DIAGNOSIS — G5601 Carpal tunnel syndrome, right upper limb: Secondary | ICD-10-CM | POA: Diagnosis not present

## 2020-12-16 ENCOUNTER — Ambulatory Visit (INDEPENDENT_AMBULATORY_CARE_PROVIDER_SITE_OTHER): Payer: BC Managed Care – PPO | Admitting: *Deleted

## 2020-12-16 DIAGNOSIS — J309 Allergic rhinitis, unspecified: Secondary | ICD-10-CM

## 2020-12-18 ENCOUNTER — Ambulatory Visit: Payer: BC Managed Care – PPO | Admitting: Obstetrics and Gynecology

## 2020-12-22 DIAGNOSIS — E063 Autoimmune thyroiditis: Secondary | ICD-10-CM | POA: Diagnosis not present

## 2020-12-22 DIAGNOSIS — E782 Mixed hyperlipidemia: Secondary | ICD-10-CM | POA: Diagnosis not present

## 2020-12-22 DIAGNOSIS — R799 Abnormal finding of blood chemistry, unspecified: Secondary | ICD-10-CM | POA: Diagnosis not present

## 2020-12-22 DIAGNOSIS — E1165 Type 2 diabetes mellitus with hyperglycemia: Secondary | ICD-10-CM | POA: Diagnosis not present

## 2020-12-23 ENCOUNTER — Ambulatory Visit (INDEPENDENT_AMBULATORY_CARE_PROVIDER_SITE_OTHER): Payer: BC Managed Care – PPO | Admitting: *Deleted

## 2020-12-23 DIAGNOSIS — J309 Allergic rhinitis, unspecified: Secondary | ICD-10-CM

## 2020-12-30 ENCOUNTER — Ambulatory Visit (INDEPENDENT_AMBULATORY_CARE_PROVIDER_SITE_OTHER): Payer: BC Managed Care – PPO

## 2020-12-30 DIAGNOSIS — J309 Allergic rhinitis, unspecified: Secondary | ICD-10-CM

## 2021-01-02 ENCOUNTER — Other Ambulatory Visit: Payer: Self-pay

## 2021-01-02 MED ORDER — ARNUITY ELLIPTA 100 MCG/ACT IN AEPB: 1.0000 | INHALATION_SPRAY | Freq: Every day | RESPIRATORY_TRACT | 4 refills | Status: DC

## 2021-01-05 DIAGNOSIS — M9901 Segmental and somatic dysfunction of cervical region: Secondary | ICD-10-CM | POA: Diagnosis not present

## 2021-01-05 DIAGNOSIS — G5602 Carpal tunnel syndrome, left upper limb: Secondary | ICD-10-CM | POA: Diagnosis not present

## 2021-01-05 DIAGNOSIS — M9902 Segmental and somatic dysfunction of thoracic region: Secondary | ICD-10-CM | POA: Diagnosis not present

## 2021-01-05 DIAGNOSIS — M9903 Segmental and somatic dysfunction of lumbar region: Secondary | ICD-10-CM | POA: Diagnosis not present

## 2021-01-05 DIAGNOSIS — M9905 Segmental and somatic dysfunction of pelvic region: Secondary | ICD-10-CM | POA: Diagnosis not present

## 2021-01-26 DIAGNOSIS — M9901 Segmental and somatic dysfunction of cervical region: Secondary | ICD-10-CM | POA: Diagnosis not present

## 2021-01-26 DIAGNOSIS — M9902 Segmental and somatic dysfunction of thoracic region: Secondary | ICD-10-CM | POA: Diagnosis not present

## 2021-01-26 DIAGNOSIS — G5602 Carpal tunnel syndrome, left upper limb: Secondary | ICD-10-CM | POA: Diagnosis not present

## 2021-01-26 DIAGNOSIS — M9905 Segmental and somatic dysfunction of pelvic region: Secondary | ICD-10-CM | POA: Diagnosis not present

## 2021-01-26 DIAGNOSIS — M9903 Segmental and somatic dysfunction of lumbar region: Secondary | ICD-10-CM | POA: Diagnosis not present

## 2021-01-29 ENCOUNTER — Ambulatory Visit (INDEPENDENT_AMBULATORY_CARE_PROVIDER_SITE_OTHER): Payer: BC Managed Care – PPO | Admitting: *Deleted

## 2021-01-29 DIAGNOSIS — J309 Allergic rhinitis, unspecified: Secondary | ICD-10-CM

## 2021-02-09 DIAGNOSIS — M9902 Segmental and somatic dysfunction of thoracic region: Secondary | ICD-10-CM | POA: Diagnosis not present

## 2021-02-09 DIAGNOSIS — M9901 Segmental and somatic dysfunction of cervical region: Secondary | ICD-10-CM | POA: Diagnosis not present

## 2021-02-09 DIAGNOSIS — M9905 Segmental and somatic dysfunction of pelvic region: Secondary | ICD-10-CM | POA: Diagnosis not present

## 2021-02-09 DIAGNOSIS — M9903 Segmental and somatic dysfunction of lumbar region: Secondary | ICD-10-CM | POA: Diagnosis not present

## 2021-02-09 DIAGNOSIS — G5601 Carpal tunnel syndrome, right upper limb: Secondary | ICD-10-CM | POA: Diagnosis not present

## 2021-02-23 DIAGNOSIS — M9901 Segmental and somatic dysfunction of cervical region: Secondary | ICD-10-CM | POA: Diagnosis not present

## 2021-02-23 DIAGNOSIS — G5601 Carpal tunnel syndrome, right upper limb: Secondary | ICD-10-CM | POA: Diagnosis not present

## 2021-02-23 DIAGNOSIS — M9902 Segmental and somatic dysfunction of thoracic region: Secondary | ICD-10-CM | POA: Diagnosis not present

## 2021-02-23 DIAGNOSIS — M9905 Segmental and somatic dysfunction of pelvic region: Secondary | ICD-10-CM | POA: Diagnosis not present

## 2021-02-23 DIAGNOSIS — M9903 Segmental and somatic dysfunction of lumbar region: Secondary | ICD-10-CM | POA: Diagnosis not present

## 2021-02-24 ENCOUNTER — Ambulatory Visit (INDEPENDENT_AMBULATORY_CARE_PROVIDER_SITE_OTHER): Payer: BC Managed Care – PPO | Admitting: *Deleted

## 2021-02-24 DIAGNOSIS — J309 Allergic rhinitis, unspecified: Secondary | ICD-10-CM

## 2021-03-18 NOTE — Progress Notes (Signed)
54 y.o. [id number] Married Caucasian female here for annual exam.    No recurrent bleeding.   Working on weight gain.   PCP:  Carol Ada, MD   Patient's last menstrual period was 06/14/2016 (approximate).           Sexually active: No.  The current method of family planning is post menopausal status.    Exercising: No.  The patient does not participate in regular exercise at present. Smoker:  no  Health Maintenance: Pap:  07-03-20 Neg:Neg HR HPV, 12-11-18 Neg:Neg HR HPV, 11-07-15 Neg:Neg HR HPV History of abnormal Pap:  no MMG:  08-29-20 3D/Neg/BiRads1 Colonoscopy:  12-07-18 polyps;next 2025 BMD:   n/a  Result  n/a TDaP:  11-25-16 Gardasil:   no HIV:Neg with Preg Hep C: 12-11-18 Neg Screening Labs:  Dr. Leonel Ramsay - integr[facility]  Plans to do her flu vaccine and new bivalent Covid vaccine.  Thinks she has done her Shingrix vaccine.    reports that she has never smoked. She has never used smokeless tobacco. She reports that she does not currently use alcohol. She reports that she does not use drugs.  Past Medical History:  Diagnosis Date   Allergic rhinitis    Arthritis    Eczema    Endometrial polyp    GERD (gastroesophageal reflux disease)    prn tums and watches diet   Hashimoto's disease    followed by dr Modena Nunnery,  takes low dose naltrexone   History of DVT of lower extremity 1995   left thigh,  completed one year blood thinner   Hypertension    followed by pcp   Mild persistent asthma    followed by dr Mamie Nick. padgett [facility]   PMB (postmenopausal bleeding)    Pre-diabetes    Wears glasses     Past Surgical History:  Procedure Laterality Date   DILATATION & CURETTAGE/HYSTEROSCOPY WITH MYOSURE N/A 10/20/2020   Procedure: DILATATION & CURETTAGE/HYSTEROSCOPY WITH MYOSURE polypectomy;  Surgeon: Nunzio Cobbs, MD;  Location: [facility];  Service: Gynecology;  Laterality: N/A;   HAND SURGERY Right 2006  @[facility]    abscess    KNEE ARTHROSCOPY Left 04-27-2010   @[facility]    LAPAROSCOPY  1991   diagnostic    SHOULDER ARTHROSCOPY Right 2004    Current Outpatient Medications  Medication Sig Dispense Refill   acetaminophen (TYLENOL) 500 MG tablet Take 2 tablets (1,000 mg total) by mouth every 6 (six) hours as needed. 100 tablet 0   Acetylcysteine (NAC PO) Take 900 mg by mouth 2 (two) times daily.     albuterol (PROVENTIL HFA) 108 (90 Base) MCG/ACT inhaler Inhale 2 puffs into the lungs every 4 (four) hours as needed. (Patient taking differently: Inhale 2 puffs into the lungs every 4 (four) hours as needed.) 18 g 1   ARNUITY ELLIPTA 100 MCG/ACT AEPB Inhale 1 puff into the lungs daily. 30 each 4   buPROPion (WELLBUTRIN XL) 300 MG 24 hr tablet Take 300 mg by mouth at bedtime.     calcium carbonate (TUMS - DOSED IN MG ELEMENTAL CALCIUM) 500 MG chewable tablet Chew 1 tablet by mouth as needed for indigestion or heartburn.     Continuous Blood Gluc Sensor (FREESTYLE LIBRE 14 DAY SENSOR) MISC SMARTSIG:1 Each Topical Every 2 Weeks     EPINEPHrine 0.3 mg/0.3 mL IJ SOAJ injection Inject 0.3 mg into the muscle as needed.     fluticasone furoate-vilanterol (BREO ELLIPTA) 100-25 MCG/INH AEPB Inhale 1  puff into the lungs daily.     losartan (COZAAR) 50 MG tablet Take 50 mg by mouth 2 (two) times daily.  5   Magnesium (OPTIMAG 125 PO) Take 2 capsules by mouth 2 (two) times daily.     Melatonin 5 MG CAPS Take 1 capsule by mouth at bedtime as needed.     metFORMIN (GLUCOPHAGE) 500 MG tablet Take 500 mg by mouth 2 (two) times daily with a meal.     Misc Natural Products (T-RELIEF CBD+13 SL) Place 600 mg under the tongue daily. Hemp/ cbd one under tongue daily     modafinil (PROVIGIL) 100 MG tablet Take 100 mg by mouth daily as needed (takes as needed at work / during the day).     Multiple Vitamin (MULTIVITAMIN) capsule Take 1 capsule by mouth daily.     NALTREXONE HCL PO Take 4.5 mg by mouth 2 (two) times daily.     PRESCRIPTION  MEDICATION every 30 (thirty) days. Allergy shots     Probiotic Product (PROBIOTIC DAILY) CAPS Take 1 capsule by mouth daily.     Vitamin A 3 MG (10000 UT) TABS Take 1 capsule by mouth 2 (two) times a week.     Vitamin D, Ergocalciferol, (DRISDOL) 50000 units CAPS capsule Take 50,000 Units by mouth every 7 (seven) days. Monday's     No current facility-administered medications for this visit.    Family History  Problem Relation Age of Onset   Arthritis Mother    Hypertension Mother    Diabetes Mother    Allergic rhinitis Neg Hx    Angioedema Neg Hx    Eczema Neg Hx    Asthma Neg Hx    Immunodeficiency Neg Hx    Urticaria Neg Hx     Review of Systems  All other systems reviewed and are negative.  Exam:   BP 140/76   Pulse 90   Ht 5' 7"  (1.702 m)   Wt 281 lb (127.5 kg)   LMP 06/14/2016 (Approximate)   SpO2 97%   BMI 44.01 kg/m     General appearance: alert, cooperative and appears stated age Head: normocephalic, without obvious abnormality, atraumatic Neck: no adenopathy, supple, symmetrical, trachea midline and thyroid normal to inspection and palpation Lungs: clear to auscultation bilaterally Breasts: normal appearance, no masses or tenderness, No nipple retraction or dimpling, No nipple discharge or bleeding, No axillary adenopathy Heart: regular rate and rhythm Abdomen: soft, non-tender; no masses, no organomegaly Extremities: extremities normal, atraumatic, no cyanosis or edema Skin: skin color, texture, turgor normal. No rashes or lesions Lymph nodes: cervical, supraclavicular, and axillary nodes normal. Neurologic: grossly normal  Pelvic: External genitalia:  no lesions              No abnormal inguinal nodes palpated.              Urethra:  normal appearing urethra with no masses, tenderness or lesions              Bartholins and Skenes: normal                 Vagina: normal appearing vagina with normal color and discharge, no lesions              Cervix: no  lesions              Pap taken: no Bimanual Exam:  Uterus:  normal size, contour, position, consistency, mobility, non-tender  Adnexa: no mass, fullness, tenderness              Rectal exam: yes.  Confirms.              Anus:  normal sphincter tone, no lesions  Chaperone was present for exam:  Estill Bamberg, CMA  Assessment:   Well woman visit with gynecologic exam. Postmenopausal female.  Status post hysteroscopy with dilation and curettage and Myosure resection of endometrial polyp Hx DVT.  Hx low vit D.  On high dose vit D.  Hx sebaceous cysts of the vulva.   Plan: Mammogram screening discussed. Self breast awareness reviewed. Pap and HR HPV as above. Guidelines for Calcium, Vitamin D, regular exercise program including cardiovascular and weight bearing exercise.   Follow up annually and prn.    After visit summary provided.

## 2021-03-19 ENCOUNTER — Ambulatory Visit (INDEPENDENT_AMBULATORY_CARE_PROVIDER_SITE_OTHER): Payer: BC Managed Care – PPO | Admitting: Obstetrics and Gynecology

## 2021-03-19 ENCOUNTER — Other Ambulatory Visit: Payer: Self-pay

## 2021-03-19 ENCOUNTER — Encounter: Payer: Self-pay | Admitting: Obstetrics and Gynecology

## 2021-03-19 VITALS — BP 140/76 | HR 90 | Ht 67.0 in | Wt 281.0 lb

## 2021-03-19 DIAGNOSIS — Z01419 Encounter for gynecological examination (general) (routine) without abnormal findings: Secondary | ICD-10-CM | POA: Diagnosis not present

## 2021-03-19 NOTE — Patient Instructions (Signed)

## 2021-03-24 ENCOUNTER — Ambulatory Visit (INDEPENDENT_AMBULATORY_CARE_PROVIDER_SITE_OTHER): Payer: BC Managed Care – PPO

## 2021-03-24 DIAGNOSIS — J309 Allergic rhinitis, unspecified: Secondary | ICD-10-CM

## 2021-04-08 DIAGNOSIS — S60011A Contusion of right thumb without damage to nail, initial encounter: Secondary | ICD-10-CM | POA: Diagnosis not present

## 2021-04-08 DIAGNOSIS — S62521A Displaced fracture of distal phalanx of right thumb, initial encounter for closed fracture: Secondary | ICD-10-CM | POA: Diagnosis not present

## 2021-04-16 ENCOUNTER — Telehealth: Payer: Self-pay | Admitting: Family Medicine

## 2021-04-16 MED ORDER — ARNUITY ELLIPTA 100 MCG/ACT IN AEPB: 1.0000 | INHALATION_SPRAY | Freq: Every day | RESPIRATORY_TRACT | 2 refills | Status: DC

## 2021-04-16 NOTE — Telephone Encounter (Signed)
I put in a refill for Arnuity to [facility] in [place] and informed patient via voicemail.  [place]

## 2021-04-16 NOTE — Telephone Encounter (Signed)
Pt states she needs refill for arnuity ellipta

## 2021-04-21 ENCOUNTER — Ambulatory Visit (INDEPENDENT_AMBULATORY_CARE_PROVIDER_SITE_OTHER): Payer: BC Managed Care – PPO | Admitting: *Deleted

## 2021-04-21 DIAGNOSIS — J309 Allergic rhinitis, unspecified: Secondary | ICD-10-CM

## 2021-04-21 DIAGNOSIS — M9901 Segmental and somatic dysfunction of cervical region: Secondary | ICD-10-CM | POA: Diagnosis not present

## 2021-04-21 DIAGNOSIS — M9903 Segmental and somatic dysfunction of lumbar region: Secondary | ICD-10-CM | POA: Diagnosis not present

## 2021-04-21 DIAGNOSIS — M9902 Segmental and somatic dysfunction of thoracic region: Secondary | ICD-10-CM | POA: Diagnosis not present

## 2021-04-21 DIAGNOSIS — M9905 Segmental and somatic dysfunction of pelvic region: Secondary | ICD-10-CM | POA: Diagnosis not present

## 2021-04-21 DIAGNOSIS — F419 Anxiety disorder, unspecified: Secondary | ICD-10-CM | POA: Diagnosis not present

## 2021-05-18 ENCOUNTER — Encounter: Payer: Self-pay | Admitting: Family Medicine

## 2021-05-18 ENCOUNTER — Ambulatory Visit (INDEPENDENT_AMBULATORY_CARE_PROVIDER_SITE_OTHER): Payer: BC Managed Care – PPO | Admitting: Family Medicine

## 2021-05-18 ENCOUNTER — Other Ambulatory Visit: Payer: Self-pay

## 2021-05-18 VITALS — BP 136/78 | HR 85 | Temp 97.7°F | Resp 18 | Ht 67.0 in | Wt 278.8 lb

## 2021-05-18 DIAGNOSIS — J4520 Mild intermittent asthma, uncomplicated: Secondary | ICD-10-CM | POA: Insufficient documentation

## 2021-05-18 DIAGNOSIS — J4540 Moderate persistent asthma, uncomplicated: Secondary | ICD-10-CM | POA: Diagnosis not present

## 2021-05-18 DIAGNOSIS — T7800XA Anaphylactic reaction due to unspecified food, initial encounter: Secondary | ICD-10-CM

## 2021-05-18 DIAGNOSIS — J3089 Other allergic rhinitis: Secondary | ICD-10-CM | POA: Diagnosis not present

## 2021-05-18 DIAGNOSIS — J302 Other seasonal allergic rhinitis: Secondary | ICD-10-CM | POA: Diagnosis not present

## 2021-05-18 MED ORDER — EPINEPHRINE 0.3 MG/0.3ML IJ SOAJ: 0.3000 mg | INTRAMUSCULAR | 1 refills | Status: DC | PRN

## 2021-05-18 NOTE — Progress Notes (Signed)
[place] [id number] Dept: [phone]  FOLLOW UP NOTE  Patient ID: Monica Good, female    DOB: 11/12/1965  Age: 55 y.o. MRN: [id number] Date of Office Visit: 05/18/2021  Assessment  Chief Complaint: Follow-up (Pt states she have been doing well.), Allergic Rhinitis , and Allergy Testing  HPI Monica Good is a 55 year old female who presents to the clinic for follow-up visit.  She was last seen in this clinic on 10/06/2020 for evaluation of asthma, allergic rhinitis, and food allergy to fish, shellfish, and red meats.  At today's visit, she reports her asthma has been well controlled with no shortness of breath, cough, or wheeze with activity or rest.  She continues Arnuity 100-1 puff once a day and rarely needs to use her albuterol for relief of asthma symptoms.  Allergic rhinitis is reported as moderately well controlled with sneezing as the main symptom.  She continues allergen immunotherapy once a week with no large or local reactions.  She reports a significant decrease in her symptoms of allergic rhinitis while continuing on allergen immunotherapy.  She continues to avoid fish, shellfish, and mammals with no accidental ingestion or EpiPen use since her last visit to this clinic.  She reports experiencing gastrointestinal symptoms such as diarrhea occasionally after consuming milk or dairy products.  She denies cardiopulmonary or integumentary symptoms with this ingestion.  Her current medications are listed in the chart.   Drug Allergies:  Allergies  Allergen Reactions   Alpha-Gal Anaphylaxis   Beef-Derived Products Anaphylaxis   Fish Allergy Anaphylaxis   Lambs Quarters Anaphylaxis    Has not had but is warned by allergist that could have anaphylaxis.   Other Anaphylaxis    **SEAFOOD**   Pork-Derived Products Anaphylaxis   Shellfish Allergy Anaphylaxis   Bee Venom Swelling   Latex Hives   Peanut Oil Itching    Hands itch, just peanut ,  Tree nuts okay     Physical Exam: BP 136/78   Pulse 85   Temp 97.7 F (36.5 C) (Temporal)   Resp 18   Ht 5' 7"  (1.702 m)   Wt 278 lb 12.8 oz (126.5 kg)   LMP 06/14/2016 (Approximate)   SpO2 97%   BMI 43.67 kg/m    Physical Exam Vitals reviewed.  Constitutional:      Appearance: Normal appearance.  HENT:     Head: Normocephalic and atraumatic.     Right Ear: Tympanic membrane normal.     Left Ear: Tympanic membrane normal.     Nose:     Comments: Bilateral nares slightly erythematous with clear nasal drainage noted.  Pharynx normal.  Ears normal.  Eyes normal.    Mouth/Throat:     Pharynx: Oropharynx is clear.  Eyes:     Conjunctiva/sclera: Conjunctivae normal.  Cardiovascular:     Rate and Rhythm: Normal rate and regular rhythm.     Heart sounds: Normal heart sounds. No murmur heard. Pulmonary:     Effort: Pulmonary effort is normal.     Breath sounds: Normal breath sounds.     Comments: Lungs clear to auscultation Musculoskeletal:        General: Normal range of motion.     Cervical back: Normal range of motion and neck supple.  Skin:    General: Skin is warm and dry.  Neurological:     Mental Status: She is alert and oriented to person, place, and time.  Psychiatric:  Mood and Affect: Mood normal.        Behavior: Behavior normal.        Thought Content: Thought content normal.        Judgment: Judgment normal.    Diagnostics: FVC 2.79, FEV1 2.26.  Predicted FVC 3.60, predicted FEV1 2.84.  Spirometry indicates normal ventilatory function.  Assessment and Plan: 1. Moderate persistent asthma without complication   2. Seasonal and perennial allergic rhinitis   3. Food Allergy     Meds ordered this encounter  Medications   EPINEPHrine 0.3 mg/0.3 mL IJ SOAJ injection    Sig: Inject 0.3 mg into the muscle as needed.    Dispense:  1 each    Refill:  1     Patient Instructions  Asthma Continue albuterol once every 4 hours as needed for cough or wheeze You may  use albuterol 2 puffs 5 to 15 minutes before activity to decrease cough or wheeze  Allergic rhinitis Your skin testing at today's visit was positive to grass pollens, weed pollens, tree pollens, molds, dust mites, cats, dogs, horse, and cockroach. Continue allergen avoidance measures directed toward grass pollen, weed pollen, tree pollen, mold, dust mite, cockroach, and pets as listed below Continue allergen immunotherapy once every 4 weeks and have access to an epinephrine autoinjector set. We will compare your allergy testing today with your current allergen immunotherapy to identify any changes that may help reduce your allergy symptoms  Food allergy Continue to avoid fish, shellfish, and mammal meat.  In case of an allergic reaction, take Benadryl 50 mg capsules every 6 hours, and if life-threatening symptoms occur, inject with EpiPen 0.3 mg. Your skin testing to milk and casein was negative at today's visit.  Call the clinic if this treatment plan is not working well for you  Follow up in 6 months or sooner if needed.   Return in about 6 months (around 11/16/2021), or if symptoms worsen or fail to improve.    Thank you for the opportunity to care for this patient.  Please do not hesitate to contact me with questions.  Gareth Morgan, FNP [facility]

## 2021-05-18 NOTE — Patient Instructions (Signed)
Asthma Continue albuterol once every 4 hours as needed for cough or wheeze You may use albuterol 2 puffs 5 to 15 minutes before activity to decrease cough or wheeze  Allergic rhinitis Your skin testing at today's visit was positive to grass pollens, weed pollens, tree pollens, molds, dust mites, cats, dogs, horse, and cockroach. Continue allergen avoidance measures directed toward grass pollen, weed pollen, tree pollen, mold, dust mite, cockroach, and pets as listed below Continue allergen immunotherapy once every 4 weeks and have access to an epinephrine autoinjector set. We will compare your allergy testing today with your current allergen immunotherapy to identify any changes that may help reduce your allergy symptoms  Food allergy Continue to avoid fish, shellfish, and mammal meat.  In case of an allergic reaction, take Benadryl 50 mg capsules every 6 hours, and if life-threatening symptoms occur, inject with EpiPen 0.3 mg. Your skin testing to milk and casein was negative at today's visit.  Call the clinic if this treatment plan is not working well for you  Follow up in 6 months or sooner if needed.  Reducing Pollen Exposure The American Academy of Allergy, Asthma and Immunology suggests the following steps to reduce your exposure to pollen during allergy seasons. Do not hang sheets or clothing out to dry; pollen may collect on these items. Do not mow lawns or spend time around freshly cut grass; mowing stirs up pollen. Keep windows closed at night.  Keep car windows closed while driving. Minimize morning activities outdoors, a time when pollen counts are usually at their highest. Stay indoors as much as possible when pollen counts or humidity is high and on windy days when pollen tends to remain in the air longer. Use air conditioning when possible.  Many air conditioners have filters that trap the pollen spores. Use a HEPA room air filter to remove pollen form the indoor air you  breathe.  Control of Mold Allergen Mold and fungi can grow on a variety of surfaces provided certain temperature and moisture conditions exist.  Outdoor molds grow on plants, decaying vegetation and soil.  The major outdoor mold, Alternaria and Cladosporium, are found in very high numbers during hot and dry conditions.  Generally, a late Summer - Fall peak is seen for common outdoor fungal spores.  Rain will temporarily lower outdoor mold spore count, but counts rise rapidly when the rainy period ends.  The most important indoor molds are Aspergillus and Penicillium.  Dark, humid and poorly ventilated basements are ideal sites for mold growth.  The next most common sites of mold growth are the bathroom and the kitchen.  Outdoor [employer] Use air conditioning and keep windows closed Avoid exposure to decaying vegetation. Avoid leaf raking. Avoid grain handling. Consider wearing a face mask if working in moldy areas.  Indoor Mold Control Maintain humidity below 50%. Clean washable surfaces with 5% bleach solution. Remove sources e.g. Contaminated carpets.   Control of Dust Mite Allergen Dust mites play a major role in allergic asthma and rhinitis. They occur in environments with high humidity wherever human skin is found. Dust mites absorb humidity from the atmosphere (ie, they do not drink) and feed on organic matter (including shed human and animal skin). Dust mites are a microscopic type of insect that you cannot see with the naked eye. High levels of dust mites have been detected from mattresses, pillows, carpets, upholstered furniture, bed covers, clothes, soft toys and any woven material. The principal allergen of the dust mite is  found in its feces. A gram of dust may contain 1,000 mites and 250,000 fecal particles. Mite antigen is easily measured in the air during house cleaning activities. Dust mites do not bite and do not cause harm to humans, other than by triggering  allergies/asthma.  Ways to decrease your exposure to dust mites in your home:  1. Encase mattresses, box springs and pillows with a mite-impermeable barrier or cover  2. Wash sheets, blankets and drapes weekly in hot water (130 F) with detergent and dry them in a dryer on the hot setting.  3. Have the room cleaned frequently with a vacuum cleaner and a damp dust-mop. For carpeting or rugs, vacuuming with a vacuum cleaner equipped with a high-efficiency particulate air (HEPA) filter. The dust mite allergic individual should not be in a room which is being cleaned and should wait 1 hour after cleaning before going into the room.  4. Do not sleep on upholstered furniture (eg, couches).  5. If possible removing carpeting, upholstered furniture and drapery from the home is ideal. Horizontal blinds should be eliminated in the rooms where the person spends the most time (bedroom, study, television room). Washable vinyl, roller-type shades are optimal.  6. Remove all non-washable stuffed toys from the bedroom. Wash stuffed toys weekly like sheets and blankets above.  7. Reduce indoor humidity to less than 50%. Inexpensive humidity monitors can be purchased at most hardware stores. Do not use a humidifier as can make the problem worse and are not recommended.  Control of Cockroach Allergen Cockroach allergen has been identified as an important cause of acute attacks of asthma, especially in urban settings.  There are fifty-five species of cockroach that exist in the [place], however only three, the [place], [place] species produce allergen that can affect patients with Asthma.  Allergens can be obtained from fecal particles, egg casings and secretions from cockroaches.    Remove food sources. Reduce access to water. Seal access and entry points. Spray runways with 0.5-1% Diazinon or Chlorpyrifos Blow boric acid power under stoves and refrigerator. Place bait stations  (hydramethylnon) at feeding sites.   Control of Dog or Cat Allergen Avoidance is the best way to manage a dog or cat allergy. If you have a dog or cat and are allergic to dog or cats, consider removing the dog or cat from the home. If you have a dog or cat but don't want to find it a new home, or if your family wants a pet even though someone in the household is allergic, here are some strategies that may help keep symptoms at bay:  Keep the pet out of your bedroom and restrict it to only a few rooms. Be advised that keeping the dog or cat in only one room will not limit the allergens to that room. Don't pet, hug or kiss the dog or cat; if you do, wash your hands with soap and water. High-efficiency particulate air (HEPA) cleaners run continuously in a bedroom or living room can reduce allergen levels over time. Regular use of a high-efficiency vacuum cleaner or a central vacuum can reduce allergen levels. Giving your dog or cat a bath at least once a week can reduce airborne allergen.

## 2021-05-20 DIAGNOSIS — M9901 Segmental and somatic dysfunction of cervical region: Secondary | ICD-10-CM | POA: Diagnosis not present

## 2021-05-20 DIAGNOSIS — M9902 Segmental and somatic dysfunction of thoracic region: Secondary | ICD-10-CM | POA: Diagnosis not present

## 2021-05-20 DIAGNOSIS — M9905 Segmental and somatic dysfunction of pelvic region: Secondary | ICD-10-CM | POA: Diagnosis not present

## 2021-05-20 DIAGNOSIS — M9903 Segmental and somatic dysfunction of lumbar region: Secondary | ICD-10-CM | POA: Diagnosis not present

## 2021-05-20 DIAGNOSIS — G5602 Carpal tunnel syndrome, left upper limb: Secondary | ICD-10-CM | POA: Diagnosis not present

## 2021-05-21 ENCOUNTER — Ambulatory Visit (INDEPENDENT_AMBULATORY_CARE_PROVIDER_SITE_OTHER): Payer: BC Managed Care – PPO

## 2021-05-21 DIAGNOSIS — J309 Allergic rhinitis, unspecified: Secondary | ICD-10-CM

## 2021-05-26 DIAGNOSIS — J3089 Other allergic rhinitis: Secondary | ICD-10-CM | POA: Diagnosis not present

## 2021-05-26 NOTE — Progress Notes (Signed)
VIALS MADE. EXP 05-26-22

## 2021-06-16 ENCOUNTER — Ambulatory Visit (INDEPENDENT_AMBULATORY_CARE_PROVIDER_SITE_OTHER): Payer: BC Managed Care – PPO | Admitting: *Deleted

## 2021-06-16 DIAGNOSIS — G5602 Carpal tunnel syndrome, left upper limb: Secondary | ICD-10-CM | POA: Diagnosis not present

## 2021-06-16 DIAGNOSIS — M9903 Segmental and somatic dysfunction of lumbar region: Secondary | ICD-10-CM | POA: Diagnosis not present

## 2021-06-16 DIAGNOSIS — M9901 Segmental and somatic dysfunction of cervical region: Secondary | ICD-10-CM | POA: Diagnosis not present

## 2021-06-16 DIAGNOSIS — J309 Allergic rhinitis, unspecified: Secondary | ICD-10-CM | POA: Diagnosis not present

## 2021-06-16 DIAGNOSIS — M9902 Segmental and somatic dysfunction of thoracic region: Secondary | ICD-10-CM | POA: Diagnosis not present

## 2021-06-16 DIAGNOSIS — M9905 Segmental and somatic dysfunction of pelvic region: Secondary | ICD-10-CM | POA: Diagnosis not present

## 2021-07-13 DIAGNOSIS — R799 Abnormal finding of blood chemistry, unspecified: Secondary | ICD-10-CM | POA: Diagnosis not present

## 2021-07-13 DIAGNOSIS — E063 Autoimmune thyroiditis: Secondary | ICD-10-CM | POA: Diagnosis not present

## 2021-07-13 DIAGNOSIS — R7303 Prediabetes: Secondary | ICD-10-CM | POA: Diagnosis not present

## 2021-07-13 DIAGNOSIS — E039 Hypothyroidism, unspecified: Secondary | ICD-10-CM | POA: Diagnosis not present

## 2021-07-13 DIAGNOSIS — E782 Mixed hyperlipidemia: Secondary | ICD-10-CM | POA: Diagnosis not present

## 2021-07-14 ENCOUNTER — Ambulatory Visit (INDEPENDENT_AMBULATORY_CARE_PROVIDER_SITE_OTHER): Payer: BC Managed Care – PPO | Admitting: *Deleted

## 2021-07-14 DIAGNOSIS — J309 Allergic rhinitis, unspecified: Secondary | ICD-10-CM | POA: Diagnosis not present

## 2021-07-14 DIAGNOSIS — M9902 Segmental and somatic dysfunction of thoracic region: Secondary | ICD-10-CM | POA: Diagnosis not present

## 2021-07-14 DIAGNOSIS — M9903 Segmental and somatic dysfunction of lumbar region: Secondary | ICD-10-CM | POA: Diagnosis not present

## 2021-07-14 DIAGNOSIS — G5602 Carpal tunnel syndrome, left upper limb: Secondary | ICD-10-CM | POA: Diagnosis not present

## 2021-07-14 DIAGNOSIS — M9901 Segmental and somatic dysfunction of cervical region: Secondary | ICD-10-CM | POA: Diagnosis not present

## 2021-07-14 DIAGNOSIS — M9905 Segmental and somatic dysfunction of pelvic region: Secondary | ICD-10-CM | POA: Diagnosis not present

## 2021-07-21 ENCOUNTER — Ambulatory Visit (INDEPENDENT_AMBULATORY_CARE_PROVIDER_SITE_OTHER): Payer: BC Managed Care – PPO | Admitting: *Deleted

## 2021-07-21 DIAGNOSIS — J309 Allergic rhinitis, unspecified: Secondary | ICD-10-CM | POA: Diagnosis not present

## 2021-07-27 ENCOUNTER — Ambulatory Visit (INDEPENDENT_AMBULATORY_CARE_PROVIDER_SITE_OTHER): Payer: BC Managed Care – PPO

## 2021-07-27 DIAGNOSIS — J309 Allergic rhinitis, unspecified: Secondary | ICD-10-CM | POA: Diagnosis not present

## 2021-07-28 DIAGNOSIS — N3000 Acute cystitis without hematuria: Secondary | ICD-10-CM | POA: Diagnosis not present

## 2021-07-28 DIAGNOSIS — R300 Dysuria: Secondary | ICD-10-CM | POA: Diagnosis not present

## 2021-08-04 ENCOUNTER — Ambulatory Visit (INDEPENDENT_AMBULATORY_CARE_PROVIDER_SITE_OTHER): Payer: BC Managed Care – PPO

## 2021-08-04 ENCOUNTER — Other Ambulatory Visit: Payer: Self-pay | Admitting: Allergy

## 2021-08-04 DIAGNOSIS — J309 Allergic rhinitis, unspecified: Secondary | ICD-10-CM | POA: Diagnosis not present

## 2021-08-04 DIAGNOSIS — M9903 Segmental and somatic dysfunction of lumbar region: Secondary | ICD-10-CM | POA: Diagnosis not present

## 2021-08-04 DIAGNOSIS — M9901 Segmental and somatic dysfunction of cervical region: Secondary | ICD-10-CM | POA: Diagnosis not present

## 2021-08-04 DIAGNOSIS — M9905 Segmental and somatic dysfunction of pelvic region: Secondary | ICD-10-CM | POA: Diagnosis not present

## 2021-08-04 DIAGNOSIS — M9902 Segmental and somatic dysfunction of thoracic region: Secondary | ICD-10-CM | POA: Diagnosis not present

## 2021-08-04 DIAGNOSIS — G5601 Carpal tunnel syndrome, right upper limb: Secondary | ICD-10-CM | POA: Diagnosis not present

## 2021-08-11 ENCOUNTER — Ambulatory Visit (INDEPENDENT_AMBULATORY_CARE_PROVIDER_SITE_OTHER): Payer: BC Managed Care – PPO

## 2021-08-11 DIAGNOSIS — J309 Allergic rhinitis, unspecified: Secondary | ICD-10-CM | POA: Diagnosis not present

## 2021-08-31 DIAGNOSIS — Z1231 Encounter for screening mammogram for malignant neoplasm of breast: Secondary | ICD-10-CM | POA: Diagnosis not present

## 2021-09-02 ENCOUNTER — Encounter: Payer: Self-pay | Admitting: Obstetrics and Gynecology

## 2021-09-08 ENCOUNTER — Ambulatory Visit (INDEPENDENT_AMBULATORY_CARE_PROVIDER_SITE_OTHER): Payer: BC Managed Care – PPO

## 2021-09-08 DIAGNOSIS — J309 Allergic rhinitis, unspecified: Secondary | ICD-10-CM | POA: Diagnosis not present

## 2021-10-08 ENCOUNTER — Ambulatory Visit (INDEPENDENT_AMBULATORY_CARE_PROVIDER_SITE_OTHER): Payer: BC Managed Care – PPO

## 2021-10-08 DIAGNOSIS — J309 Allergic rhinitis, unspecified: Secondary | ICD-10-CM | POA: Diagnosis not present

## 2021-10-13 DIAGNOSIS — J3089 Other allergic rhinitis: Secondary | ICD-10-CM

## 2021-10-13 NOTE — Progress Notes (Signed)
VIALS EXP 10-14-22 ?

## 2021-10-26 DIAGNOSIS — M9903 Segmental and somatic dysfunction of lumbar region: Secondary | ICD-10-CM | POA: Diagnosis not present

## 2021-10-26 DIAGNOSIS — M9901 Segmental and somatic dysfunction of cervical region: Secondary | ICD-10-CM | POA: Diagnosis not present

## 2021-10-26 DIAGNOSIS — M9905 Segmental and somatic dysfunction of pelvic region: Secondary | ICD-10-CM | POA: Diagnosis not present

## 2021-10-26 DIAGNOSIS — M9902 Segmental and somatic dysfunction of thoracic region: Secondary | ICD-10-CM | POA: Diagnosis not present

## 2021-11-02 DIAGNOSIS — M9903 Segmental and somatic dysfunction of lumbar region: Secondary | ICD-10-CM | POA: Diagnosis not present

## 2021-11-02 DIAGNOSIS — M9901 Segmental and somatic dysfunction of cervical region: Secondary | ICD-10-CM | POA: Diagnosis not present

## 2021-11-02 DIAGNOSIS — M9905 Segmental and somatic dysfunction of pelvic region: Secondary | ICD-10-CM | POA: Diagnosis not present

## 2021-11-02 DIAGNOSIS — M9902 Segmental and somatic dysfunction of thoracic region: Secondary | ICD-10-CM | POA: Diagnosis not present

## 2021-11-03 DIAGNOSIS — Z713 Dietary counseling and surveillance: Secondary | ICD-10-CM | POA: Diagnosis not present

## 2021-11-04 ENCOUNTER — Ambulatory Visit (INDEPENDENT_AMBULATORY_CARE_PROVIDER_SITE_OTHER): Payer: BC Managed Care – PPO

## 2021-11-04 DIAGNOSIS — J309 Allergic rhinitis, unspecified: Secondary | ICD-10-CM

## 2021-11-10 ENCOUNTER — Other Ambulatory Visit: Payer: Self-pay | Admitting: Family Medicine

## 2021-11-10 DIAGNOSIS — M9902 Segmental and somatic dysfunction of thoracic region: Secondary | ICD-10-CM | POA: Diagnosis not present

## 2021-11-10 DIAGNOSIS — M9903 Segmental and somatic dysfunction of lumbar region: Secondary | ICD-10-CM | POA: Diagnosis not present

## 2021-11-10 DIAGNOSIS — M9901 Segmental and somatic dysfunction of cervical region: Secondary | ICD-10-CM | POA: Diagnosis not present

## 2021-11-10 DIAGNOSIS — M9905 Segmental and somatic dysfunction of pelvic region: Secondary | ICD-10-CM | POA: Diagnosis not present

## 2021-11-16 DIAGNOSIS — M9901 Segmental and somatic dysfunction of cervical region: Secondary | ICD-10-CM | POA: Diagnosis not present

## 2021-11-16 DIAGNOSIS — M9905 Segmental and somatic dysfunction of pelvic region: Secondary | ICD-10-CM | POA: Diagnosis not present

## 2021-11-16 DIAGNOSIS — M9902 Segmental and somatic dysfunction of thoracic region: Secondary | ICD-10-CM | POA: Diagnosis not present

## 2021-11-16 DIAGNOSIS — M9903 Segmental and somatic dysfunction of lumbar region: Secondary | ICD-10-CM | POA: Diagnosis not present

## 2021-11-23 DIAGNOSIS — M9903 Segmental and somatic dysfunction of lumbar region: Secondary | ICD-10-CM | POA: Diagnosis not present

## 2021-11-23 DIAGNOSIS — M9902 Segmental and somatic dysfunction of thoracic region: Secondary | ICD-10-CM | POA: Diagnosis not present

## 2021-11-23 DIAGNOSIS — M9901 Segmental and somatic dysfunction of cervical region: Secondary | ICD-10-CM | POA: Diagnosis not present

## 2021-11-23 DIAGNOSIS — M9905 Segmental and somatic dysfunction of pelvic region: Secondary | ICD-10-CM | POA: Diagnosis not present

## 2021-11-30 DIAGNOSIS — M9901 Segmental and somatic dysfunction of cervical region: Secondary | ICD-10-CM | POA: Diagnosis not present

## 2021-11-30 DIAGNOSIS — M9902 Segmental and somatic dysfunction of thoracic region: Secondary | ICD-10-CM | POA: Diagnosis not present

## 2021-11-30 DIAGNOSIS — M9905 Segmental and somatic dysfunction of pelvic region: Secondary | ICD-10-CM | POA: Diagnosis not present

## 2021-11-30 DIAGNOSIS — M9903 Segmental and somatic dysfunction of lumbar region: Secondary | ICD-10-CM | POA: Diagnosis not present

## 2021-12-01 ENCOUNTER — Ambulatory Visit (INDEPENDENT_AMBULATORY_CARE_PROVIDER_SITE_OTHER): Payer: BC Managed Care – PPO

## 2021-12-01 DIAGNOSIS — J309 Allergic rhinitis, unspecified: Secondary | ICD-10-CM | POA: Diagnosis not present

## 2021-12-07 DIAGNOSIS — M9902 Segmental and somatic dysfunction of thoracic region: Secondary | ICD-10-CM | POA: Diagnosis not present

## 2021-12-07 DIAGNOSIS — M9905 Segmental and somatic dysfunction of pelvic region: Secondary | ICD-10-CM | POA: Diagnosis not present

## 2021-12-07 DIAGNOSIS — M9903 Segmental and somatic dysfunction of lumbar region: Secondary | ICD-10-CM | POA: Diagnosis not present

## 2021-12-07 DIAGNOSIS — M9901 Segmental and somatic dysfunction of cervical region: Secondary | ICD-10-CM | POA: Diagnosis not present

## 2021-12-09 NOTE — Patient Instructions (Addendum)
Asthma Continue albuterol once every 4 hours as needed for cough or wheeze You may use albuterol 2 puffs 5 to 15 minutes before activity to decrease cough or wheeze Continue Arnuity 100 mcg 1 puff once a day to help prevent cough and wheeze  Allergic rhinitis  skin testing on 05/18/21 was positive to: grass pollens, weed pollens, tree pollens, molds, dust mites, cats, dogs, horse, and cockroach. Continue allergen avoidance measures directed toward grass pollen, weed pollen, tree pollen, mold, dust mite, cockroach, and pets as listed below Continue allergen immunotherapy once every 4 weeks and have access to an epinephrine autoinjector set for now.  At your next follow-up appointment we will discuss again about potentially stopping allergy injections since you have been on allergy injections for over 5 years. Start over-the-counter Astelin (azelastine) nasal spray 1 to 2 sprays each nostril twice a day as needed for runny nose/drainage down the throat.  If this medication does not help with the lump in your throat we will consider starting a proton pump inhibitor.  Call us in 2 weeks with an update. Continue with an antihistamine once a day as needed  Food allergy Continue to avoid fish, shellfish, and mammal meat.  In case of an allergic reaction, take Benadryl 50 mg capsules every 6 hours, and if life-threatening symptoms occur, inject with EpiPen 0.3 mg. We will get lab work to follow-up on your food allergies.  We will call you with results once they are back   Call the clinic if this treatment plan is not working well for you  Follow up in 6 months or sooner if needed.  Reducing Pollen Exposure The American Academy of Allergy, Asthma and Immunology suggests the following steps to reduce your exposure to pollen during allergy seasons. Do not hang sheets or clothing out to dry; pollen may collect on these items. Do not mow lawns or spend time around freshly cut grass; mowing stirs up  pollen. Keep windows closed at night.  Keep car windows closed while driving. Minimize morning activities outdoors, a time when pollen counts are usually at their highest. Stay indoors as much as possible when pollen counts or humidity is high and on windy days when pollen tends to remain in the air longer. Use air conditioning when possible.  Many air conditioners have filters that trap the pollen spores. Use a HEPA room air filter to remove pollen form the indoor air you breathe.  Control of Mold Allergen Mold and fungi can grow on a variety of surfaces provided certain temperature and moisture conditions exist.  Outdoor molds grow on plants, decaying vegetation and soil.  The major outdoor mold, Alternaria and Cladosporium, are found in very high numbers during hot and dry conditions.  Generally, a late Summer - Fall peak is seen for common outdoor fungal spores.  Rain will temporarily lower outdoor mold spore count, but counts rise rapidly when the rainy period ends.  The most important indoor molds are Aspergillus and Penicillium.  Dark, humid and poorly ventilated basements are ideal sites for mold growth.  The next most common sites of mold growth are the bathroom and the kitchen.  Outdoor [employer] Use air conditioning and keep windows closed Avoid exposure to decaying vegetation. Avoid leaf raking. Avoid grain handling. Consider wearing a face mask if working in moldy areas.  Indoor Mold Control Maintain humidity below 50%. Clean washable surfaces with 5% bleach solution. Remove sources e.g. Contaminated carpets.   Control of Dust Mite Allergen Dust  mites play a major role in allergic asthma and rhinitis. They occur in environments with high humidity wherever human skin is found. Dust mites absorb humidity from the atmosphere (ie, they do not drink) and feed on organic matter (including shed human and animal skin). Dust mites are a microscopic type of insect that you cannot see  with the naked eye. High levels of dust mites have been detected from mattresses, pillows, carpets, upholstered furniture, bed covers, clothes, soft toys and any woven material. The principal allergen of the dust mite is found in its feces. A gram of dust may contain 1,000 mites and 250,000 fecal particles. Mite antigen is easily measured in the air during house cleaning activities. Dust mites do not bite and do not cause harm to humans, other than by triggering allergies/asthma.  Ways to decrease your exposure to dust mites in your home:  1. Encase mattresses, box springs and pillows with a mite-impermeable barrier or cover  2. Wash sheets, blankets and drapes weekly in hot water (130 F) with detergent and dry them in a dryer on the hot setting.  3. Have the room cleaned frequently with a vacuum cleaner and a damp dust-mop. For carpeting or rugs, vacuuming with a vacuum cleaner equipped with a high-efficiency particulate air (HEPA) filter. The dust mite allergic individual should not be in a room which is being cleaned and should wait 1 hour after cleaning before going into the room.  4. Do not sleep on upholstered furniture (eg, couches).  5. If possible removing carpeting, upholstered furniture and drapery from the home is ideal. Horizontal blinds should be eliminated in the rooms where the person spends the most time (bedroom, study, television room). Washable vinyl, roller-type shades are optimal.  6. Remove all non-washable stuffed toys from the bedroom. Wash stuffed toys weekly like sheets and blankets above.  7. Reduce indoor humidity to less than 50%. Inexpensive humidity monitors can be purchased at most hardware stores. Do not use a humidifier as can make the problem worse and are not recommended.  Control of Cockroach Allergen Cockroach allergen has been identified as an important cause of acute attacks of asthma, especially in urban settings.  There are fifty-five species of  cockroach that exist in the [place], however only three, the [place], [place] species produce allergen that can affect patients with Asthma.  Allergens can be obtained from fecal particles, egg casings and secretions from cockroaches.    Remove food sources. Reduce access to water. Seal access and entry points. Spray runways with 0.5-1% Diazinon or Chlorpyrifos Blow boric acid power under stoves and refrigerator. Place bait stations (hydramethylnon) at feeding sites.   Control of Dog or Cat Allergen Avoidance is the best way to manage a dog or cat allergy. If you have a dog or cat and are allergic to dog or cats, consider removing the dog or cat from the home. If you have a dog or cat but don't want to find it a new home, or if your family wants a pet even though someone in the household is allergic, here are some strategies that may help keep symptoms at bay:  Keep the pet out of your bedroom and restrict it to only a few rooms. Be advised that keeping the dog or cat in only one room will not limit the allergens to that room. Don't pet, hug or kiss the dog or cat; if you do, wash your hands with soap and water. High-efficiency particulate air (HEPA)  cleaners run continuously in a bedroom or living room can reduce allergen levels over time. Regular use of a high-efficiency vacuum cleaner or a central vacuum can reduce allergen levels. Giving your dog or cat a bath at least once a week can reduce airborne allergen.

## 2021-12-11 ENCOUNTER — Encounter: Payer: Self-pay | Admitting: Family

## 2021-12-11 ENCOUNTER — Ambulatory Visit: Payer: BC Managed Care – PPO | Admitting: Family

## 2021-12-11 VITALS — BP 104/62 | HR 65 | Temp 98.1°F | Resp 20

## 2021-12-11 DIAGNOSIS — J3089 Other allergic rhinitis: Secondary | ICD-10-CM

## 2021-12-11 DIAGNOSIS — T7800XA Anaphylactic reaction due to unspecified food, initial encounter: Secondary | ICD-10-CM

## 2021-12-11 DIAGNOSIS — J4540 Moderate persistent asthma, uncomplicated: Secondary | ICD-10-CM

## 2021-12-11 MED ORDER — EPINEPHRINE 0.3 MG/0.3ML IJ SOAJ
0.3000 mg | INTRAMUSCULAR | 1 refills | Status: DC | PRN
Start: 2021-12-11 — End: 2022-11-23

## 2021-12-11 MED ORDER — ALBUTEROL SULFATE HFA 108 (90 BASE) MCG/ACT IN AERS: 2.0000 | INHALATION_SPRAY | RESPIRATORY_TRACT | 1 refills | Status: DC | PRN

## 2021-12-11 MED ORDER — ARNUITY ELLIPTA 100 MCG/ACT IN AEPB: 1.0000 | INHALATION_SPRAY | Freq: Every day | RESPIRATORY_TRACT | 1 refills | Status: DC

## 2021-12-11 NOTE — Progress Notes (Signed)
[place] [id number] Dept: [phone]  FOLLOW UP NOTE  Patient ID: Monica Good, female    DOB: 11-29-1965  Age: 56 y.o. MRN: [id number] Date of Office Visit: 12/11/2021  Assessment  Chief Complaint: Asthma  HPI Monica Good is a 56 year old female who presents today for follow-up of moderate persistent asthma without complication, seasonal and perennial allergic rhinitis, and food allergy.  She was last seen on May 18, 2021 by Gareth Morgan, FNP.  Moderate persistent asthma: She denies cough, wheeze, tightness in chest, shortness of breath, and nocturnal awakenings due to breathing problems.  Since her last office visit she has not required any systemic steroids or made any trips to the emergency room or urgent care due to breathing problems.  She cannot remember the last time when she used her albuterol inhaler.  She continues to take Arnuity 100 mcg 1 puff once a day.  Seasonal and perennial allergic rhinitis: She denies rhinorrhea and nasal congestion.  She has been getting allergy injections since October 08, 2016 her last skin test on May 18, 2021 was positive to grass, weed, tree, mold, dust mite, cat, dog, horse, and cockroach.  She has questions about whether she should stop allergy injections.  She reports that her allergies tend to be worse in the fall season.  She would like to hold off until the fall and see how her allergies do before stopping.  She questions whether she has postnasal drip.  She reports having a lump in her throat daily that she feels in the morning.  If she gets a good cough she does get mucus up.  This has been going on for years.  Discussed how this could be due to reflux or postnasal drip.  Offered to try a proton pump inhibitor or azelastine nasal spray.  She would like to try the azelastine nasal spray first.  She does report heartburn symptoms approximately once a month for which she will takes Tums.  Her heartburn occurs usually  when she eats late or has spicy foods or licorice.  She continues to avoid fish, shellfish, and red meat without any accidental ingestion or use of her epinephrine autoinjector device.  She reports ever since she was a child she would notice throat closure with fish and shellfish.  She cannot even be in the room with someone cooking fish or shellfish and she will develop the symptoms.  Usually if her husband cooks fish or shellfish he will do it at a time where she will not be in the house for several hours.  She is interested in getting lab work to follow-up on her food allergies.  She reports that red meat causes her to have the sensation of throat closure also.   Drug Allergies:  Allergies  Allergen Reactions   Alpha-Gal Anaphylaxis   Beef-Derived Products Anaphylaxis   Fish Allergy Anaphylaxis   Lambs Quarters Anaphylaxis    Has not had but is warned by allergist that could have anaphylaxis.   Other Anaphylaxis    **SEAFOOD**   Pork-Derived Products Anaphylaxis   Shellfish Allergy Anaphylaxis   Bee Venom Swelling   Latex Hives   Peanut Oil Itching    Hands itch, just peanut ,  Tree nuts okay    Review of Systems: Review of Systems  Constitutional:  Negative for chills and fever.  HENT:         Denies rhinorrhea and nasal congestion.  Questions whether she has postnasal  drip due to having the sensation of a lump in her throat in the morning.  She gets a good cough up she will get mucus.  Eyes:        Denies itchy watery eyes  Respiratory:         Denies cough, wheeze, tightness in chest, shortness of breath, and nocturnal awakenings due to breathing problems  Cardiovascular:  Negative for chest pain and palpitations.  Gastrointestinal:  Positive for heartburn.       Reports heartburn that occurs approximately once a month.  Usually occurs when she eats something spicy or if she has licorice.  She will take Tums.  Genitourinary:  Negative for frequency.  Skin:  Negative for  itching and rash.  Neurological:  Positive for headaches.       Reports rare headaches  Endo/Heme/Allergies:  Positive for environmental allergies.     Physical Exam: BP 104/62   Pulse 65   Temp 98.1 F (36.7 C) (Temporal)   Resp 20   LMP 06/14/2016 (Approximate)   SpO2 98%    Physical Exam Constitutional:      Appearance: Normal appearance.  HENT:     Head: Normocephalic and atraumatic.     Comments: Pharynx normal, eyes normal, ears normal, nose: Bilateral lower turbinates moderately edematous and slightly erythematous with no drainage noted    Right Ear: Tympanic membrane, ear canal and external ear normal.     Left Ear: Tympanic membrane, ear canal and external ear normal.     Mouth/Throat:     Mouth: Mucous membranes are moist.     Pharynx: Oropharynx is clear.  Eyes:     Conjunctiva/sclera: Conjunctivae normal.  Cardiovascular:     Rate and Rhythm: Normal rate and regular rhythm.     Heart sounds: Normal heart sounds.  Pulmonary:     Effort: Pulmonary effort is normal.     Breath sounds: Normal breath sounds.     Comments: Lungs clear to auscultation Musculoskeletal:     Cervical back: Neck supple.  Skin:    General: Skin is warm.  Neurological:     Mental Status: She is alert and oriented to person, place, and time.  Psychiatric:        Mood and Affect: Mood normal.        Behavior: Behavior normal.        Thought Content: Thought content normal.        Judgment: Judgment normal.     Diagnostics: Will get at next office visit  Assessment and Plan: 1. Moderate persistent asthma without complication   2. Allergy with anaphylaxis due to food   3. Seasonal and perennial allergic rhinitis     Meds ordered this encounter  Medications   EPINEPHrine 0.3 mg/0.3 mL IJ SOAJ injection    Sig: Inject 0.3 mg into the muscle as needed.    Dispense:  2 each    Refill:  1    TEVA OR MYLAN ONLY   albuterol (PROVENTIL HFA) 108 (90 Base) MCG/ACT inhaler    Sig:  Inhale 2 puffs into the lungs every 4 (four) hours as needed.    Dispense:  18 g    Refill:  1   Fluticasone Furoate (ARNUITY ELLIPTA) 100 MCG/ACT AEPB    Sig: Inhale 1 puff into the lungs daily.    Dispense:  90 each    Refill:  1    90 DAY REFILL    Patient Instructions  Asthma Continue albuterol once  every 4 hours as needed for cough or wheeze You may use albuterol 2 puffs 5 to 15 minutes before activity to decrease cough or wheeze Continue Arnuity 100 mcg 1 puff once a day to help prevent cough and wheeze  Allergic rhinitis  skin testing on 05/18/21 was positive to: grass pollens, weed pollens, tree pollens, molds, dust mites, cats, dogs, horse, and cockroach. Continue allergen avoidance measures directed toward grass pollen, weed pollen, tree pollen, mold, dust mite, cockroach, and pets as listed below Continue allergen immunotherapy once every 4 weeks and have access to an epinephrine autoinjector set for now.  At your next follow-up appointment we will discuss again about potentially stopping allergy injections since you have been on allergy injections for over 5 years. Start over-the-counter Astelin (azelastine) nasal spray 1 to 2 sprays each nostril twice a day as needed for runny nose/drainage down the throat.  If this medication does not help with the lump in your throat we will consider starting a proton pump inhibitor.  Call us in 2 weeks with an update. Continue with an antihistamine once a day as needed  Food allergy Continue to avoid fish, shellfish, and mammal meat.  In case of an allergic reaction, take Benadryl 50 mg capsules every 6 hours, and if life-threatening symptoms occur, inject with EpiPen 0.3 mg. We will get lab work to follow-up on your food allergies.  We will call you with results once they are back   Call the clinic if this treatment plan is not working well for you  Follow up in 6 months or sooner if needed.  Reducing Pollen Exposure The American  Academy of All[employer], [employer] and Immun[employer] suggests the following steps to reduce your exposure to pollen during allergy seasons. Do not hang sheets or clothing out to dry; pollen may collect on these items. Do not mow lawns or spend time around freshly cut grass; mowing stirs up pollen. Keep windows closed at night.  Keep car windows closed while driving. Minimize morning activities outdoors, a time when pollen counts are usually at their highest. Stay indoors as much as possible when pollen counts or humidity is high and on windy days when pollen tends to remain in the air longer. Use air conditioning when possible.  Many air conditioners have filters that trap the pollen spores. Use a HEPA room air filter to remove pollen form the indoor air you breathe.  Control of Mold Allergen Mold and fungi can grow on a variety of surfaces provided certain temperature and moisture conditions exist.  Outdoor molds grow on plants, decaying vegetation and soil.  The major outdoor mold, Alternaria and Cladosporium, are found in very high numbers during hot and dry conditions.  Generally, a late Summer - Fall peak is seen for common outdoor fungal spores.  Rain will temporarily lower outdoor mold spore count, but counts rise rapidly when the rainy period ends.  The most important indoor molds are Aspergillus and Penicillium.  Dark, humid and poorly ventilated basements are ideal sites for mold growth.  The next most common sites of mold growth are the bathroom and the kitchen.  Outdoor [employer] Use air conditioning and keep windows closed Avoid exposure to decaying vegetation. Avoid leaf raking. Avoid grain handling. Consider wearing a face mask if working in moldy areas.  Indoor Mold Control Maintain humidity below 50%. Clean washable surfaces with 5% bleach solution. Remove sources e.g. Contaminated carpets.   Control of Dust Mite Allergen Dust mites play a major  role in allergic asthma and  rhinitis. They occur in environments with high humidity wherever human skin is found. Dust mites absorb humidity from the atmosphere (ie, they do not drink) and feed on organic matter (including shed human and animal skin). Dust mites are a microscopic type of insect that you cannot see with the naked eye. High levels of dust mites have been detected from mattresses, pillows, carpets, upholstered furniture, bed covers, clothes, soft toys and any woven material. The principal allergen of the dust mite is found in its feces. A gram of dust may contain 1,000 mites and 250,000 fecal particles. Mite antigen is easily measured in the air during house cleaning activities. Dust mites do not bite and do not cause harm to humans, other than by triggering allergies/asthma.  Ways to decrease your exposure to dust mites in your home:  1. Encase mattresses, box springs and pillows with a mite-impermeable barrier or cover  2. Wash sheets, blankets and drapes weekly in hot water (130 F) with detergent and dry them in a dryer on the hot setting.  3. Have the room cleaned frequently with a vacuum cleaner and a damp dust-mop. For carpeting or rugs, vacuuming with a vacuum cleaner equipped with a high-efficiency particulate air (HEPA) filter. The dust mite allergic individual should not be in a room which is being cleaned and should wait 1 hour after cleaning before going into the room.  4. Do not sleep on upholstered furniture (eg, couches).  5. If possible removing carpeting, upholstered furniture and drapery from the home is ideal. Horizontal blinds should be eliminated in the rooms where the person spends the most time (bedroom, study, television room). Washable vinyl, roller-type shades are optimal.  6. Remove all non-washable stuffed toys from the bedroom. Wash stuffed toys weekly like sheets and blankets above.  7. Reduce indoor humidity to less than 50%. Inexpensive humidity monitors can be purchased at most  hardware stores. Do not use a humidifier as can make the problem worse and are not recommended.  Control of Cockroach Allergen Cockroach allergen has been identified as an important cause of acute attacks of asthma, especially in urban settings.  There are fifty-five species of cockroach that exist in the [place], however only three, the [place], [place] species produce allergen that can affect patients with Asthma.  Allergens can be obtained from fecal particles, egg casings and secretions from cockroaches.    Remove food sources. Reduce access to water. Seal access and entry points. Spray runways with 0.5-1% Diazinon or Chlorpyrifos Blow boric acid power under stoves and refrigerator. Place bait stations (hydramethylnon) at feeding sites.   Control of Dog or Cat Allergen Avoidance is the best way to manage a dog or cat allergy. If you have a dog or cat and are allergic to dog or cats, consider removing the dog or cat from the home. If you have a dog or cat but don't want to find it a new home, or if your family wants a pet even though someone in the household is allergic, here are some strategies that may help keep symptoms at bay:  Keep the pet out of your bedroom and restrict it to only a few rooms. Be advised that keeping the dog or cat in only one room will not limit the allergens to that room. Don't pet, hug or kiss the dog or cat; if you do, wash your hands with soap and water. High-efficiency particulate air (HEPA) cleaners run continuously in  a bedroom or living room can reduce allergen levels over time. Regular use of a high-efficiency vacuum cleaner or a central vacuum can reduce allergen levels. Giving your dog or cat a bath at least once a week can reduce airborne allergen. Return in about 6 months (around 06/12/2022), or if symptoms worsen or fail to improve.    Thank you for the opportunity to care for this patient.  Please do not hesitate to contact me  with questions.  Althea Charon, FNP [facility]

## 2021-12-18 DIAGNOSIS — T7800XA Anaphylactic reaction due to unspecified food, initial encounter: Secondary | ICD-10-CM | POA: Diagnosis not present

## 2021-12-21 DIAGNOSIS — M9901 Segmental and somatic dysfunction of cervical region: Secondary | ICD-10-CM | POA: Diagnosis not present

## 2021-12-21 DIAGNOSIS — M9905 Segmental and somatic dysfunction of pelvic region: Secondary | ICD-10-CM | POA: Diagnosis not present

## 2021-12-21 DIAGNOSIS — M9903 Segmental and somatic dysfunction of lumbar region: Secondary | ICD-10-CM | POA: Diagnosis not present

## 2021-12-21 DIAGNOSIS — M9902 Segmental and somatic dysfunction of thoracic region: Secondary | ICD-10-CM | POA: Diagnosis not present

## 2021-12-21 LAB — ALLERGEN PROFILE, SHELLFISH
Clam IgE: <0.1 kU/L
F023-IgE Crab: <0.1 kU/L
F080-IgE Lobster: <0.1 kU/L
F290-IgE Oyster: <0.1 kU/L
Scallop IgE: <0.1 kU/L
Shrimp IgE: <0.1 kU/L

## 2021-12-21 LAB — ALLERGEN PROFILE, FOOD-FISH
Allergen Mackerel IgE: <0.1 kU/L
Allergen Salmon IgE: <0.1 kU/L
Allergen Trout IgE: <0.1 kU/L
Allergen Walley Pike IgE: <0.1 kU/L
Codfish IgE: <0.1 kU/L
Halibut IgE: <0.1 kU/L
Tuna: <0.1 kU/L

## 2021-12-21 LAB — ALPHA-GAL PANEL
Allergen Lamb IgE: 0.19 kU/L — AB
Beef IgE: 0.24 kU/L — AB
IgE (Immunoglobulin E), Serum: 157 IU/mL (ref 6–495)
O215-IgE Alpha-Gal: <0.1 kU/L
Pork IgE: 0.51 kU/L — AB

## 2021-12-23 NOTE — Progress Notes (Signed)
Please let Samari know that we received her lab work.   Fish and shellfish are both negative,but with your history of throat closure, even when around someone cooking these foods, I would recommend that you continue to avoid fish and shellfish.  Your lab work for red meat was decreased some,but still elevated. I would continue to avoid all read meat due to you having the sensation of throat closure with exposure.  Please make sure that your epinephrine auto injector device is up to date and that she carries it with her at all times.

## 2021-12-30 ENCOUNTER — Ambulatory Visit: Payer: BC Managed Care – PPO | Admitting: Obstetrics and Gynecology

## 2021-12-30 ENCOUNTER — Encounter: Payer: Self-pay | Admitting: Obstetrics and Gynecology

## 2021-12-30 ENCOUNTER — Telehealth: Payer: Self-pay | Admitting: Obstetrics and Gynecology

## 2021-12-30 ENCOUNTER — Other Ambulatory Visit: Payer: Self-pay

## 2021-12-30 ENCOUNTER — Other Ambulatory Visit (HOSPITAL_COMMUNITY)
Admission: RE | Admit: 2021-12-30 | Discharge: 2021-12-30 | Disposition: A | Discharge status: Home / Self Care | Payer: BC Managed Care – PPO | Source: Ambulatory Visit | Attending: Obstetrics and Gynecology | Admitting: Obstetrics and Gynecology

## 2021-12-30 ENCOUNTER — Ambulatory Visit (INDEPENDENT_AMBULATORY_CARE_PROVIDER_SITE_OTHER): Payer: BC Managed Care – PPO | Admitting: *Deleted

## 2021-12-30 VITALS — BP 114/80 | Temp 98.2°F | Ht 67.0 in | Wt 281.0 lb

## 2021-12-30 DIAGNOSIS — N950 Postmenopausal bleeding: Secondary | ICD-10-CM | POA: Insufficient documentation

## 2021-12-30 DIAGNOSIS — Z124 Encounter for screening for malignant neoplasm of cervix: Secondary | ICD-10-CM | POA: Insufficient documentation

## 2021-12-30 DIAGNOSIS — J309 Allergic rhinitis, unspecified: Secondary | ICD-10-CM | POA: Diagnosis not present

## 2021-12-30 NOTE — Telephone Encounter (Addendum)
Scheduled patient at [facility] for Friday 01/01/22 at 2:55 for 3:15pm pelvic u/s. Gave her address and instructions and advised her about the 24 hour cancellation policy to avoie $38 fee.   I tried to reach Butch Penny to speak with patient to schedule while on the line but she was not available. Patient said to just schedule her whenever and leave her a message on her voice mail with date/time as she is traveling.

## 2021-12-30 NOTE — Telephone Encounter (Signed)
Return for Pelvic ultrasound and possible endometrial biopsy within 2 weeks.  No availability Dr Quincy Simmonds would like for patient to go to [facility]. Please schedule for patient and let me know once scheduled so I can schedule ENDO BX in the office.

## 2021-12-30 NOTE — Progress Notes (Signed)
GYNECOLOGY  VISIT   HPI: 56 y.o.   Married  Caucasian  female   [phone] with Patient's last menstrual period was 06/14/2016 (approximate).   here for postmenopausal bleeding which began last night. She felt very tired and achy yesterday. Slight pelvic cramps.  No intercourse preceded.  No use of vaginal medication.   Not using any HRT.  Not taking any blood thinner.   Going to [place] in the fall.   GYNECOLOGIC HISTORY: Patient's last menstrual period was 06/14/2016 (approximate). Contraception:  PMP Menopausal hormone therapy:  n/a Last mammogram:  09-02-21 Neg/BiRads1 Last pap smear:  07-03-20 Neg:Neg HR HPV, 12-11-18 Neg:Neg HR HPV, 11-07-15 Neg:Neg HR HPV No hx abnormal paps.         OB History     Gravida  4   Para  4   Term  4   Preterm      AB      Living  4      SAB      IAB      Ectopic      Multiple      Live Births  4              Patient Active Problem List   Diagnosis Date Noted   Mild intermittent asthma without complication 38/18/2993   Coughing 11/24/2017   Globus sensation 11/24/2017   Laryngopharyngeal reflux (LPR) 11/24/2017   Allergic reaction 03/02/2017   Contact dermatitis due to chemicals 09/22/2016   Seasonal and perennial allergic rhinitis 09/01/2015   Mild persistent asthma 09/01/2015   Food Allergy 09/01/2015   Cystocele 11/02/2013    Past Medical History:  Diagnosis Date   Allergic rhinitis    Arthritis    Eczema    Endometrial polyp    GERD (gastroesophageal reflux disease)    prn tums and watches diet   Hashimoto's disease    followed by dr Modena Nunnery,  takes low dose naltrexone   History of DVT of lower extremity 1995   left thigh,  completed one year blood thinner   Hypertension    followed by pcp   Mild persistent asthma    followed [facility] dr [facility] Nick. [facility]   PMB (postmenopausal bleeding)    Pre-diabetes    Wears glasses     Past Surgical History:  Procedure Laterality Date    DILATATION & CURETTAGE/HYSTEROSCOPY WITH MYOSURE N/A 10/20/2020   Procedure: DILATATION & CURETTAGE/HYSTEROSCOPY WITH MYOSURE polypectomy;  Surgeon: Nunzio Cobbs, MD;  Location: [facility];  Service: Gynecology;  Laterality: N/A;   HAND SURGERY Right 2006  @[facility]    abscess   KNEE ARTHROSCOPY Left 04-27-2010   @[facility]    LAPAROSCOPY  1991   diagnostic    SHOULDER ARTHROSCOPY Right 2004    Current Outpatient Medications  Medication Sig Dispense Refill   Acetylcysteine (NAC PO) Take 900 mg by mouth 2 (two) times daily.     albuterol (PROVENTIL HFA) 108 (90 Base) MCG/ACT inhaler Inhale 2 puffs into the lungs every 4 (four) hours as needed. 18 g 1   buPROPion (WELLBUTRIN XL) 300 MG 24 hr tablet Take 300 mg by mouth at bedtime.     calcium carbonate (TUMS - DOSED IN MG ELEMENTAL CALCIUM) 500 MG chewable tablet Chew 1 tablet by mouth as needed for indigestion or heartburn.     EPINEPHrine 0.3 mg/0.3 mL IJ SOAJ injection Inject 0.3 mg into the muscle as needed. 2 each 1  Fluticasone Furoate (ARNUITY ELLIPTA) 100 MCG/ACT AEPB Inhale 1 puff into the lungs daily. 90 each 1   losartan (COZAAR) 50 MG tablet Take 50 mg by mouth 2 (two) times daily.  5   Magnesium (OPTIMAG 125 PO) Take 2 capsules by mouth 2 (two) times daily.     Melatonin 5 MG CAPS Take 1 capsule by mouth at bedtime as needed.     metFORMIN (GLUCOPHAGE) 1000 MG tablet TK 1 T PO  BID     Misc Natural Products (T-RELIEF CBD+13 SL) Place 600 mg under the tongue daily. Hemp/ cbd one under tongue daily     modafinil (PROVIGIL) 100 MG tablet Take 100 mg by mouth daily as needed (takes as needed at work / during the day).     Multiple Vitamin (MULTIVITAMIN) capsule Take 1 capsule by mouth daily.     naltrexone (DEPADE) 50 MG tablet 1 tablet     NALTREXONE HCL PO Take 4.5 mg by mouth 2 (two) times daily.     OZEMPIC, 1 MG/DOSE, 4 MG/3ML SOPN Inject 1 mg into the skin once a week.     potassium chloride (KLOR-CON M) 10  MEQ tablet      PRESCRIPTION MEDICATION every 30 (thirty) days. Allergy shots     Probiotic Product (PROBIOTIC DAILY) CAPS Take 1 capsule by mouth daily.     Vitamin A 3 MG (10000 UT) TABS Take 1 capsule by mouth 2 (two) times a week.     No current facility-administered medications for this visit.     ALLERGIES: Alpha-gal, Beef-derived products, Fish allergy, Lambs quarters, Other, Pork-derived products, Shellfish allergy, Bee venom, Latex, and Peanut oil  Family History  Problem Relation Age of Onset   Arthritis Mother    Hypertension Mother    Diabetes Mother    Allergic rhinitis Neg Hx    Angioedema Neg Hx    Eczema Neg Hx    Asthma Neg Hx    Immunodeficiency Neg Hx    Urticaria Neg Hx     Social History   Socioeconomic History   Marital status: Married    Spouse name: Not on file   Number of children: Not on file   Years of education: Not on file   Highest education level: Not on file  Occupational History   Not on file  Tobacco Use   Smoking status: Never   Smokeless tobacco: Never  Vaping Use   Vaping Use: Never used  Substance and Sexual Activity   Alcohol use: Not Currently    Comment: very rare   Drug use: Never   Sexual activity: Not Currently    Partners: Male    Birth control/protection: Post-menopausal  Other Topics Concern   Not on file  Social History Narrative   Not on file   Social Determinants of Health   Financial Resource Strain: Not on file  Food Insecurity: Not on file  Transportation Needs: Not on file  Physical Activity: Not on file  Stress: Not on file  Social Connections: Not on file  Intimate Partner Violence: Not on file    Review of Systems  Genitourinary:  Positive for menstrual problem (postmenopausal bleeding) and pelvic pain (slight pelvic cramping).  All other systems reviewed and are negative.   PHYSICAL EXAMINATION:    BP 114/80   Temp 98.2 F (36.8 C) (Oral)   Ht 5' 7"  (1.702 m)   Wt 281 lb (127.5 kg)   LMP  06/14/2016 (Approximate)   BMI 44.01  kg/m     General appearance: alert, cooperative and appears stated age   Pelvic: External genitalia:  no lesions              Urethra:  normal appearing urethra with no masses, tenderness or lesions              Bartholins and Skenes: normal                 Vagina: normal appearing vagina with normal color and discharge, no lesions              Cervix: no lesions.  Dark blood from the os.                 Bimanual Exam:  Uterus:  normal size, contour, position, consistency, mobility, non-tender              Adnexa: no mass, fullness, tenderness    Chaperone was present for exam:  Estill Bamberg, CMA  ASSESSMENT  Postmenopausal bleeding.  Status post hysteroscopy with dilation and curettage and Myosure resection of endometrial polyp Hx DVT.   PLAN  Etiologies of bleeding reviewed:  atrophy, infection, polyp, hyperplasia, and malignancy.  Pap and HR HPV testing.  Return for pelvic US and possible endometrial biopsy.  Questions invited and answered.  An After Visit Summary was printed and given to the patient.

## 2021-12-30 NOTE — Telephone Encounter (Signed)
Thank you for the update!

## 2021-12-31 LAB — CYTOLOGY - PAP
Comment: NEGATIVE
Diagnosis: UNDETERMINED — AB
High risk HPV: NEGATIVE

## 2022-01-01 ENCOUNTER — Ambulatory Visit
Admission: RE | Admit: 2022-01-01 | Discharge: 2022-01-01 | Disposition: A | Discharge status: Home / Self Care | Payer: BC Managed Care – PPO | Source: Ambulatory Visit | Attending: Obstetrics and Gynecology | Admitting: Obstetrics and Gynecology

## 2022-01-01 DIAGNOSIS — N950 Postmenopausal bleeding: Secondary | ICD-10-CM

## 2022-01-01 NOTE — Telephone Encounter (Signed)
Talked with patient and her ENDO BX is scheduled for Monday 01/04/2022 @ 3:30.

## 2022-01-04 ENCOUNTER — Other Ambulatory Visit (HOSPITAL_COMMUNITY)
Admission: RE | Admit: 2022-01-04 | Discharge: 2022-01-04 | Disposition: A | Discharge status: Home / Self Care | Payer: BC Managed Care – PPO | Source: Ambulatory Visit | Attending: Obstetrics and Gynecology | Admitting: Obstetrics and Gynecology

## 2022-01-04 ENCOUNTER — Encounter: Payer: Self-pay | Admitting: Obstetrics and Gynecology

## 2022-01-04 ENCOUNTER — Ambulatory Visit: Payer: BC Managed Care – PPO | Admitting: Obstetrics and Gynecology

## 2022-01-04 VITALS — BP 116/72 | Ht 67.0 in | Wt 281.0 lb

## 2022-01-04 DIAGNOSIS — M9903 Segmental and somatic dysfunction of lumbar region: Secondary | ICD-10-CM | POA: Diagnosis not present

## 2022-01-04 DIAGNOSIS — N950 Postmenopausal bleeding: Secondary | ICD-10-CM | POA: Diagnosis not present

## 2022-01-04 DIAGNOSIS — R9389 Abnormal findings on diagnostic imaging of other specified body structures: Secondary | ICD-10-CM | POA: Diagnosis not present

## 2022-01-04 DIAGNOSIS — N879 Dysplasia of cervix uteri, unspecified: Secondary | ICD-10-CM | POA: Diagnosis not present

## 2022-01-04 DIAGNOSIS — M9902 Segmental and somatic dysfunction of thoracic region: Secondary | ICD-10-CM | POA: Diagnosis not present

## 2022-01-04 DIAGNOSIS — M9901 Segmental and somatic dysfunction of cervical region: Secondary | ICD-10-CM | POA: Diagnosis not present

## 2022-01-04 DIAGNOSIS — M9905 Segmental and somatic dysfunction of pelvic region: Secondary | ICD-10-CM | POA: Diagnosis not present

## 2022-01-04 NOTE — Telephone Encounter (Signed)
Biopsy performed today by me.  Encounter closed.

## 2022-01-04 NOTE — Patient Instructions (Signed)
Endometrial Biopsy  An endometrial biopsy is a procedure to remove tissue samples from the endometrium, which is the lining of the uterus. The tissue that is removed can then be checked under a microscope for disease. This procedure is used to diagnose conditions such as endometrial cancer, endometrial tuberculosis, polyps, or other inflammatory conditions. This procedure may also be used to investigate uterine bleeding to determine where you are in your menstrual cycle or how your hormone levels are affecting the lining of the uterus. Tell a health care provider about: Any allergies you have. All medicines you are taking, including vitamins, herbs, eye drops, creams, and over-the-counter medicines. Any problems you or family members have had with anesthetic medicines. Any blood disorders you have. Any surgeries you have had. Any medical conditions you have. Whether you are pregnant or may be pregnant. What are the risks? Generally, this is a safe procedure. However, problems may occur, including: Bleeding. Pelvic infection. Puncture of the wall of the uterus with the biopsy device (rare). Allergic reactions to medicines. What happens before the procedure? Keep a record of your menstrual cycles as told by your health care provider. You may need to schedule your procedure for a specific time in your cycle. You may want to bring a sanitary pad to wear after the procedure. Plan to have someone take you home from the hospital or clinic. Ask your health care provider about: Changing or stopping your regular medicines. This is especially important if you are taking diabetes medicines, arthritis medicines, or blood thinners. Taking medicines such as aspirin and ibuprofen. These medicines can thin your blood. Do not take these medicines unless your health care provider tells you to take them. Taking over-the-counter medicines, vitamins, herbs, and supplements. What happens during the  procedure? You will lie on an exam table with your feet and legs supported as in a pelvic exam. Your health care provider will insert an instrument (speculum) into your vagina to see your cervix. Your cervix will be cleansed with an antiseptic solution. A medicine (local anesthetic) will be used to numb the cervix. A forceps instrument (tenaculum) will be used to hold your cervix steady for the biopsy. A thin, rod-like instrument (uterine sound) will be inserted through your cervix to determine the length of your uterus and the location where the biopsy sample will be removed. A thin, flexible tube (catheter) will be inserted through your cervix and into the uterus. The catheter will be used to collect the biopsy sample from your endometrial tissue. The catheter and speculum will then be removed, and the tissue sample will be sent to a lab for examination. The procedure may vary among health care providers and hospitals. What can I expect after procedure? You will rest in a recovery area until you are ready to go home. You may have mild cramping and a small amount of vaginal bleeding. This is normal. You may have a small amount of vaginal bleeding for a few days. This is normal. It is up to you to get the results of your procedure. Ask your health care provider, or the department that is doing the procedure, when your results will be ready. Follow these instructions at home: Take over-the-counter and prescription medicines only as told by your health care provider. Do not douche, use tampons, or have sexual intercourse until your health care provider approves. Return to your normal activities as told by your health care provider. Ask your health care provider what activities are safe for you. Follow  instructions from your health care provider about any activity restrictions, such as restrictions on strenuous exercise or heavy lifting. Keep all follow-up visits. This is important. Contact a  health care provider: You have heavy bleeding, or bleed for longer than 2 days after the procedure. You have bad smelling discharge from your vagina. You have a fever or chills. You have a burning sensation when urinating or you have difficulty urinating. You have severe pain in your lower abdomen. Get help right away if you: You have severe cramps in your stomach or back. You pass large blood clots. Your bleeding increases. You become weak or light-headed, or you faint or lose consciousness. Summary An endometrial biopsy is a procedure to remove tissue samples is taken from the endometrium, which is the lining of the uterus. The tissue sample that is removed will be checked under a microscope for disease. This procedure is used to diagnose conditions such as endometrial cancer, endometrial tuberculosis, polyps, or other inflammatory conditions. After the procedure, it is common to have mild cramping and a small amount of vaginal bleeding for a few days. Do not douche, use tampons, or have sexual intercourse until your health care provider approves. Ask your health care provider which activities are safe for you. This information is not intended to replace advice given to you by your health care provider. Make sure you discuss any questions you have with your health care provider. Document Revised: 04/27/2021 Document Reviewed: 12/24/2019 Elsevier Patient Education  [employer].

## 2022-01-04 NOTE — Progress Notes (Unsigned)
GYNECOLOGY  VISIT   HPI: 56 y.o.   Married  Caucasian  female   [phone] with Patient's last menstrual period was 06/14/2016 (approximate).   here for  endometrial biopsy.   Has had bleeding since her visit.   Pelvic US done on 01/01/22.  Narrative & Impression  CLINICAL DATA:  Postmenopausal bleeding   EXAM: TRANSABDOMINAL AND TRANSVAGINAL ULTRASOUND OF PELVIS   TECHNIQUE: Both transabdominal and transvaginal ultrasound examinations of the pelvis were performed. Transabdominal technique was performed for global imaging of the pelvis including uterus, ovaries, adnexal regions, and pelvic cul-de-sac. It was necessary to proceed with endovaginal exam following the transabdominal exam to visualize the endometrium and ovaries.   COMPARISON:  None Available.   FINDINGS: Uterus   Measurements: 10.6 x 3.6 x 5.7 cm = volume: 113.5 mL. There is inhomogeneous echogenicity in myometrium without discrete nodules.   Endometrium   Thickness: 12 mm. There is no abnormal increased vascularity in the endometrium.   Right ovary   Not sonographically visualized.   Left ovary   Not sonographically visualized.   Other findings   No abnormal free fluid.   IMPRESSION: Endometrial stripe is prominent measuring 12 mm, more prominent than usual for postmenopausal state. There is no significant increased vascularity in the endometrium. Short-term follow-up pelvic sonogram and possibly sonohysterogram in 1-2 months may be considered.   Ovaries are not sonographically visualized and not evaluated. There are no dominant adnexal masses. There is no free fluid in pelvis.     Electronically Signed   By: Elmer Picker M.D.   On: 01/01/2022 16:38    GYNECOLOGIC HISTORY: Patient's last menstrual period was 06/14/2016 (approximate). Contraception:  PMP Menopausal hormone therapy:  n/a Last mammogram:  09-02-21 Neg/BiRads1 Last pap smear:  07-03-20 Neg:Neg HR HPV, 12-11-18 Neg:Neg HR  HPV, 11-07-15 Neg:Neg HR HPV No hx abnormal paps.         OB History     Gravida  4   Para  4   Term  4   Preterm      AB      Living  4      SAB      IAB      Ectopic      Multiple      Live Births  4              Patient Active Problem List   Diagnosis Date Noted   Mild intermittent asthma without complication 70/62/3762   Coughing 11/24/2017   Globus sensation 11/24/2017   Laryngopharyngeal reflux (LPR) 11/24/2017   Allergic reaction 03/02/2017   Contact dermatitis due to chemicals 09/22/2016   Seasonal and perennial allergic rhinitis 09/01/2015   Mild persistent asthma 09/01/2015   Food Allergy 09/01/2015   Cystocele 11/02/2013    Past Medical History:  Diagnosis Date   Allergic rhinitis    Arthritis    Eczema    Endometrial polyp    GERD (gastroesophageal reflux disease)    prn tums and watches diet   Hashimoto's disease    followed by dr Modena Nunnery,  takes low dose naltrexone   History of DVT of lower extremity 1995   left thigh,  completed one year blood thinner   Hypertension    followed by pcp   Mild persistent asthma    followed [facility] dr [facility] Nick. [facility]gett [facility]   PMB (postmenopausal bleeding)    Pre-diabetes    Wears glasses     Past  Surgical History:  Procedure Laterality Date   DILATATION & CURETTAGE/HYSTEROSCOPY WITH MYOSURE N/A 10/20/2020   Procedure: DILATATION & CURETTAGE/HYSTEROSCOPY WITH MYOSURE polypectomy;  Surgeon: Nunzio Cobbs, MD;  Location: [facility];  Service: Gynecology;  Laterality: N/A;   HAND SURGERY Right 2006  @[facility]    abscess   KNEE ARTHROSCOPY Left 04-27-2010   @[facility]    LAPAROSCOPY  1991   diagnostic    SHOULDER ARTHROSCOPY Right 2004    Current Outpatient Medications  Medication Sig Dispense Refill   Acetylcysteine (NAC PO) Take 900 mg by mouth 2 (two) times daily.     albuterol (PROVENTIL HFA) 108 (90 Base) MCG/ACT inhaler Inhale 2 puffs into the lungs every 4  (four) hours as needed. 18 g 1   buPROPion (WELLBUTRIN XL) 300 MG 24 hr tablet Take 300 mg by mouth at bedtime.     calcium carbonate (TUMS - DOSED IN MG ELEMENTAL CALCIUM) 500 MG chewable tablet Chew 1 tablet by mouth as needed for indigestion or heartburn.     EPINEPHrine 0.3 mg/0.3 mL IJ SOAJ injection Inject 0.3 mg into the muscle as needed. 2 each 1   Fluticasone Furoate (ARNUITY ELLIPTA) 100 MCG/ACT AEPB Inhale 1 puff into the lungs daily. 90 each 1   losartan (COZAAR) 50 MG tablet Take 50 mg by mouth 2 (two) times daily.  5   Magnesium (OPTIMAG 125 PO) Take 2 capsules by mouth 2 (two) times daily.     Melatonin 5 MG CAPS Take 1 capsule by mouth at bedtime as needed.     metFORMIN (GLUCOPHAGE) 1000 MG tablet TK 1 T PO  BID     Misc Natural Products (T-RELIEF CBD+13 SL) Place 600 mg under the tongue daily. Hemp/ cbd one under tongue daily     modafinil (PROVIGIL) 100 MG tablet Take 100 mg by mouth daily as needed (takes as needed at work / during the day).     Multiple Vitamin (MULTIVITAMIN) capsule Take 1 capsule by mouth daily.     naltrexone (DEPADE) 50 MG tablet 1 tablet     NALTREXONE HCL PO Take 4.5 mg by mouth 2 (two) times daily.     OZEMPIC, 1 MG/DOSE, 4 MG/3ML SOPN Inject 1 mg into the skin once a week.     potassium chloride (KLOR-CON M) 10 MEQ tablet      PRESCRIPTION MEDICATION every 30 (thirty) days. Allergy shots     Probiotic Product (PROBIOTIC DAILY) CAPS Take 1 capsule by mouth daily.     Vitamin A 3 MG (10000 UT) TABS Take 1 capsule by mouth 2 (two) times a week.     No current facility-administered medications for this visit.     ALLERGIES: Alpha-gal, Beef-derived products, Fish allergy, Lambs quarters, Other, Pork-derived products, Shellfish allergy, Bee venom, Latex, and Peanut oil  Family History  Problem Relation Age of Onset   Arthritis Mother    Hypertension Mother    Diabetes Mother    Allergic rhinitis Neg Hx    Angioedema Neg Hx    Eczema Neg Hx     Asthma Neg Hx    Immunodeficiency Neg Hx    Urticaria Neg Hx     Social History   Socioeconomic History   Marital status: Married    Spouse name: Not on file   Number of children: Not on file   Years of education: Not on file   Highest education level: Not on file  Occupational History   Not  on file  Tobacco Use   Smoking status: Never   Smokeless tobacco: Never  Vaping Use   Vaping Use: Never used  Substance and Sexual Activity   Alcohol use: Not Currently    Comment: very rare   Drug use: Never   Sexual activity: Not Currently    Partners: Male    Birth control/protection: Post-menopausal  Other Topics Concern   Not on file  Social History Narrative   Not on file   Social Determinants of Health   Financial Resource Strain: Not on file  Food Insecurity: Not on file  Transportation Needs: Not on file  Physical Activity: Not on file  Stress: Not on file  Social Connections: Not on file  Intimate Partner Violence: Not on file    Review of Systems  All other systems reviewed and are negative.   PHYSICAL EXAMINATION:    BP 116/72   Ht 5' 7"  (1.702 m)   Wt 281 lb (127.5 kg)   LMP 06/14/2016 (Approximate)   BMI 44.01 kg/m     General appearance: alert, cooperative and appears stated age   Pelvic: External genitalia:  no lesions              Urethra:  normal appearing urethra with no masses, tenderness or lesions              Bartholins and Skenes: normal                 Vagina: normal appearing vagina with normal color and discharge, no lesions              Cervix: no lesions                Bimanual Exam:  Uterus:  normal size, contour, position, consistency, mobility, non-tender              Adnexa: no mass, fullness, tenderness                 Endometrial biopsy.  Consent done.  Hibiclens prep.  Paracervical block with 10 cc 1% lidocaine, lot [id number], exp 06/15/23.  Pipelle to almost 10 cm x 2.  Tissue to pathology.  No complications.  Minimal EBL.    Chaperone was present for exam:  Estill Bamberg, CMA  ASSESSMENT  Postmenopausal bleeding.  Endometrial thickening.  PLAN     An After Visit Summary was printed and given to the patient.  ______ minutes face to face time of which over 50% was spent in counseling.

## 2022-01-06 ENCOUNTER — Telehealth: Payer: Self-pay

## 2022-01-06 LAB — SURGICAL PATHOLOGY

## 2022-01-06 MED ORDER — OMEPRAZOLE MAGNESIUM 20 MG PO TBEC: DELAYED_RELEASE_TABLET | ORAL | 0 refills | Status: DC

## 2022-01-06 NOTE — Telephone Encounter (Signed)
Sent in medication and informed pt of the change in meds and she stated understanding on calling us in a month with a update

## 2022-01-06 NOTE — Telephone Encounter (Signed)
Patient calling to let us know that the nasal spray didn't work that it maybe heartburn. Please advise

## 2022-01-06 NOTE — Telephone Encounter (Signed)
Please send in a 30 day trial of omeprazole 20 mg once a day with NO refills. Take 30 minutes before dinner. Have her give [place] an update in 4 weeks and let us know if it helps. If it does help we will try decreasing to Pepcid. If omeprazole does not help we will refer to GI.  Thank you, Althea Charon, FNP

## 2022-01-07 ENCOUNTER — Other Ambulatory Visit: Payer: Self-pay

## 2022-01-07 DIAGNOSIS — N950 Postmenopausal bleeding: Secondary | ICD-10-CM

## 2022-01-07 DIAGNOSIS — R9389 Abnormal findings on diagnostic imaging of other specified body structures: Secondary | ICD-10-CM

## 2022-01-18 DIAGNOSIS — M9902 Segmental and somatic dysfunction of thoracic region: Secondary | ICD-10-CM | POA: Diagnosis not present

## 2022-01-18 DIAGNOSIS — M9901 Segmental and somatic dysfunction of cervical region: Secondary | ICD-10-CM | POA: Diagnosis not present

## 2022-01-18 DIAGNOSIS — M9903 Segmental and somatic dysfunction of lumbar region: Secondary | ICD-10-CM | POA: Diagnosis not present

## 2022-01-18 DIAGNOSIS — M9905 Segmental and somatic dysfunction of pelvic region: Secondary | ICD-10-CM | POA: Diagnosis not present

## 2022-01-28 NOTE — Progress Notes (Signed)
GYNECOLOGY  VISIT   HPI: 56 y.o.   Married  Caucasian  female   [phone] with Patient's last menstrual period was 06/14/2016 (approximate).   here for sonohysterogram.  Patient has had postmenopausal bleeding.  Pelvic US 01/01/22 at [facility] showed an endometrial thickness of 12 mm.  Endometrial biopsy 01/04/22 showed benign proliferative endometrium, negative for hyperplasia and malignancy.   She had a hysteroscopic polypectomy with dilation and curettage on 10/20/20 for postmenopausal bleeding.   Will be traveling to [place].  GYNECOLOGIC HISTORY: Patient's last menstrual period was 06/14/2016 (approximate). Contraception:  PMP Menopausal hormone therapy:  n/a Last mammogram:  09-02-21 Neg/BiRads1 Last pap smear:   12-30-21 ASCUS:Neg HR HPV, 07-03-20 Neg:Neg HR HPV, 12-11-18 Neg:Neg HR HPV No hx abnormal paps.        OB History     Gravida  4   Para  4   Term  4   Preterm      AB      Living  4      SAB      IAB      Ectopic      Multiple      Live Births  4              Patient Active Problem List   Diagnosis Date Noted   Mild intermittent asthma without complication 35/46/5681   Coughing 11/24/2017   Globus sensation 11/24/2017   Laryngopharyngeal reflux (LPR) 11/24/2017   Allergic reaction 03/02/2017   Contact dermatitis due to chemicals 09/22/2016   Seasonal and perennial allergic rhinitis 09/01/2015   Mild persistent asthma 09/01/2015   Food Allergy 09/01/2015   Cystocele 11/02/2013    Past Medical History:  Diagnosis Date   Allergic rhinitis    Arthritis    Eczema    Endometrial polyp    GERD (gastroesophageal reflux disease)    prn tums and watches diet   Hashimoto's disease    followed by dr Modena Nunnery,  takes low dose naltrexone   History of DVT of lower extremity 1995   left thigh,  completed one year blood thinner   Hypertension    followed by pcp   Mild persistent asthma    followed by dr Mamie Nick. padgett [facility]   PMB (postmenopausal bleeding)    Pre-diabetes    Wears glasses     Past Surgical History:  Procedure Laterality Date   DILATATION & CURETTAGE/HYSTEROSCOPY WITH MYOSURE N/A 10/20/2020   Procedure: DILATATION & CURETTAGE/HYSTEROSCOPY WITH MYOSURE polypectomy;  Surgeon: Nunzio Cobbs, MD;  Location: [facility];  Service: Gynecology;  Laterality: N/A;   HAND SURGERY Right 2006  @[facility]    abscess   KNEE ARTHROSCOPY Left 04-27-2010   @[facility]    LAPAROSCOPY  1991   diagnostic    SHOULDER ARTHROSCOPY Right 2004    Current Outpatient Medications  Medication Sig Dispense Refill   Acetylcysteine (NAC PO) Take 900 mg by mouth 2 (two) times daily.     albuterol (PROVENTIL HFA) 108 (90 Base) MCG/ACT inhaler Inhale 2 puffs into the lungs every 4 (four) hours as needed. 18 g 1   buPROPion (WELLBUTRIN XL) 300 MG 24 hr tablet Take 300 mg by mouth at bedtime.     calcium carbonate (TUMS - DOSED IN MG ELEMENTAL CALCIUM) 500 MG chewable tablet Chew 1 tablet by mouth as needed for indigestion or heartburn.     EPINEPHrine 0.3 mg/0.3 mL IJ SOAJ injection Inject 0.3  mg into the muscle as needed. 2 each 1   Fluticasone Furoate (ARNUITY ELLIPTA) 100 MCG/ACT AEPB Inhale 1 puff into the lungs daily. 90 each 1   losartan (COZAAR) 50 MG tablet Take 50 mg by mouth 2 (two) times daily.  5   Magnesium (OPTIMAG 125 PO) Take 2 capsules by mouth 2 (two) times daily.     Melatonin 5 MG CAPS Take 1 capsule by mouth at bedtime as needed.     metFORMIN (GLUCOPHAGE) 1000 MG tablet TK 1 T PO  BID     Misc Natural Products (T-RELIEF CBD+13 SL) Place 600 mg under the tongue daily. Hemp/ cbd one under tongue daily     modafinil (PROVIGIL) 100 MG tablet Take 100 mg by mouth daily as needed (takes as needed at work / during the day).     Multiple Vitamin (MULTIVITAMIN) capsule Take 1 capsule by mouth daily.     naltrexone (DEPADE) 50 MG tablet 1 tablet     NALTREXONE HCL PO Take 4.5 mg by mouth 2  (two) times daily.     omeprazole (PRILOSEC OTC) 20 MG tablet 1 tablet 30 minutes before meal 30 tablet 0   OZEMPIC, 1 MG/DOSE, 4 MG/3ML SOPN Inject 1 mg into the skin once a week.     potassium chloride (KLOR-CON M) 10 MEQ tablet      PRESCRIPTION MEDICATION every 30 (thirty) days. Allergy shots     Probiotic Product (PROBIOTIC DAILY) CAPS Take 1 capsule by mouth daily.     Vitamin A 3 MG (10000 UT) TABS Take 1 capsule by mouth 2 (two) times a week.     No current facility-administered medications for this visit.     ALLERGIES: Alpha-gal, Beef-derived products, Fish allergy, Lambs quarters, Other, Pork-derived products, Shellfish allergy, Bee venom, Latex, and Peanut oil  Family History  Problem Relation Age of Onset   Arthritis Mother    Hypertension Mother    Diabetes Mother    Allergic rhinitis Neg Hx    Angioedema Neg Hx    Eczema Neg Hx    Asthma Neg Hx    Immunodeficiency Neg Hx    Urticaria Neg Hx     Social History   Socioeconomic History   Marital status: Married    Spouse name: Not on file   Number of children: Not on file   Years of education: Not on file   Highest education level: Not on file  Occupational History   Not on file  Tobacco Use   Smoking status: Never   Smokeless tobacco: Never  Vaping Use   Vaping Use: Never used  Substance and Sexual Activity   Alcohol use: Not Currently    Comment: very rare   Drug use: Never   Sexual activity: Not Currently    Partners: Male    Birth control/protection: Post-menopausal  Other Topics Concern   Not on file  Social History Narrative   Not on file   Social Determinants of Health   Financial Resource Strain: Not on file  Food Insecurity: Not on file  Transportation Needs: Not on file  Physical Activity: Not on file  Stress: Not on file  Social Connections: Not on file  Intimate Partner Violence: Not on file    Review of Systems  All other systems reviewed and are negative.   PHYSICAL  EXAMINATION:    BP 122/74   Pulse 72   Ht 5' 7"  (1.702 m)   Wt 281 lb (127.5 kg)  LMP 06/14/2016 (Approximate)   SpO2 96%   BMI 44.01 kg/m     General appearance: alert, cooperative and appears stated age  Pelvic US Uterus 7.97 x 5.83 x 4.24 cm.  EMS 7.9 mm.  Endometrial mass 13 x 6 mm.  Feeder vessel noted.  Left ovary 2.41 x 1.33 x 1.02 cm, atrophic.  Right ovary 2.58 x 1.33 x 0.93 cm, atrophic.  No adnexal masses.  No free fluid.  ASSESSMENT  Postmenopausal bleeding.  Endometrial mass.  I suspect a polyp. Status post hysteroscopic polypectomy, dilation and curettage.  Hx DVT.   Unable to take heparin and Lovenox due to pork allergy.   PLAN  Pelvic US images and report reviewed with patient.  No sonohysterogram performed as the endometrial mass was identified with [place] today.  Will plan for hysteroscopy with dilation and curettage and Myosure resection of endometrial mass.  ACOG brochures to patient.   Needs Xarelto pre and post procedure.  She will take 10 mg for 2 days prior to the procedure, not take the medication the day prior to the surgery and the day of the surgery and then resume the day after surgery and take for 10 days post op.  Protocol reviewed with the patient.   She will take her last dose of naltrexone 24 hours prior to surgery.  She will stop her herbal supplements 2 weeks prior to surgery.    An After Visit Summary was printed and given to the patient.  30 min  total time was spent for this patient encounter, including preparation, face-to-face counseling with the patient, coordination of care, and documentation of the encounter.

## 2022-01-29 ENCOUNTER — Ambulatory Visit (INDEPENDENT_AMBULATORY_CARE_PROVIDER_SITE_OTHER): Payer: BC Managed Care – PPO | Admitting: *Deleted

## 2022-01-29 DIAGNOSIS — E559 Vitamin D deficiency, unspecified: Secondary | ICD-10-CM | POA: Diagnosis not present

## 2022-01-29 DIAGNOSIS — E1165 Type 2 diabetes mellitus with hyperglycemia: Secondary | ICD-10-CM | POA: Diagnosis not present

## 2022-01-29 DIAGNOSIS — J309 Allergic rhinitis, unspecified: Secondary | ICD-10-CM | POA: Diagnosis not present

## 2022-01-29 DIAGNOSIS — E063 Autoimmune thyroiditis: Secondary | ICD-10-CM | POA: Diagnosis not present

## 2022-01-29 DIAGNOSIS — R799 Abnormal finding of blood chemistry, unspecified: Secondary | ICD-10-CM | POA: Diagnosis not present

## 2022-01-29 DIAGNOSIS — E039 Hypothyroidism, unspecified: Secondary | ICD-10-CM | POA: Diagnosis not present

## 2022-01-29 DIAGNOSIS — R946 Abnormal results of thyroid function studies: Secondary | ICD-10-CM | POA: Diagnosis not present

## 2022-01-29 DIAGNOSIS — R7309 Other abnormal glucose: Secondary | ICD-10-CM | POA: Diagnosis not present

## 2022-01-29 DIAGNOSIS — R41840 Attention and concentration deficit: Secondary | ICD-10-CM | POA: Diagnosis not present

## 2022-02-02 DIAGNOSIS — M9903 Segmental and somatic dysfunction of lumbar region: Secondary | ICD-10-CM | POA: Diagnosis not present

## 2022-02-02 DIAGNOSIS — M9905 Segmental and somatic dysfunction of pelvic region: Secondary | ICD-10-CM | POA: Diagnosis not present

## 2022-02-02 DIAGNOSIS — M9901 Segmental and somatic dysfunction of cervical region: Secondary | ICD-10-CM | POA: Diagnosis not present

## 2022-02-02 DIAGNOSIS — M9902 Segmental and somatic dysfunction of thoracic region: Secondary | ICD-10-CM | POA: Diagnosis not present

## 2022-02-03 ENCOUNTER — Ambulatory Visit (INDEPENDENT_AMBULATORY_CARE_PROVIDER_SITE_OTHER): Payer: BC Managed Care – PPO | Admitting: Obstetrics and Gynecology

## 2022-02-03 ENCOUNTER — Other Ambulatory Visit: Payer: Self-pay | Admitting: Obstetrics and Gynecology

## 2022-02-03 ENCOUNTER — Ambulatory Visit: Payer: BC Managed Care – PPO

## 2022-02-03 ENCOUNTER — Telehealth: Payer: Self-pay | Admitting: Obstetrics and Gynecology

## 2022-02-03 ENCOUNTER — Encounter: Payer: Self-pay | Admitting: Obstetrics and Gynecology

## 2022-02-03 VITALS — BP 122/74 | HR 72 | Ht 67.0 in | Wt 281.0 lb

## 2022-02-03 DIAGNOSIS — R9389 Abnormal findings on diagnostic imaging of other specified body structures: Secondary | ICD-10-CM

## 2022-02-03 DIAGNOSIS — N950 Postmenopausal bleeding: Secondary | ICD-10-CM

## 2022-02-03 DIAGNOSIS — N9489 Other specified conditions associated with female genital organs and menstrual cycle: Secondary | ICD-10-CM

## 2022-02-03 NOTE — Telephone Encounter (Signed)
Please precert and schedule surgery for my patient.   Procedure will be hysteroscopy with Myosure resection of endometrial mass, dilation and curettage at [facility].  Time needed is 45 minutes.   She has recurrent postmenopausal bleeding and has an endometrial mass.   She has a history of a DVT and will need prophylaxis prior to and after surgery.  Her anticoagulation will be with Xarelto.  After we know the date of her surgery, I will provide the regimen, which was the same as what Dr. Burney Gauze recommended prior to her previous hysteroscopy.   She will need a preop visit with me.   She will be traveling and is looking to do surgery in October.

## 2022-02-04 NOTE — Telephone Encounter (Signed)
Left message to call Matheson Vandehei, RN at [facility], [phone].  

## 2022-02-09 DIAGNOSIS — M9903 Segmental and somatic dysfunction of lumbar region: Secondary | ICD-10-CM | POA: Diagnosis not present

## 2022-02-09 DIAGNOSIS — M9901 Segmental and somatic dysfunction of cervical region: Secondary | ICD-10-CM | POA: Diagnosis not present

## 2022-02-09 DIAGNOSIS — M9902 Segmental and somatic dysfunction of thoracic region: Secondary | ICD-10-CM | POA: Diagnosis not present

## 2022-02-09 DIAGNOSIS — M9905 Segmental and somatic dysfunction of pelvic region: Secondary | ICD-10-CM | POA: Diagnosis not present

## 2022-02-10 DIAGNOSIS — M7130 Other bursal cyst, unspecified site: Secondary | ICD-10-CM | POA: Diagnosis not present

## 2022-02-16 DIAGNOSIS — M9902 Segmental and somatic dysfunction of thoracic region: Secondary | ICD-10-CM | POA: Diagnosis not present

## 2022-02-16 DIAGNOSIS — M9901 Segmental and somatic dysfunction of cervical region: Secondary | ICD-10-CM | POA: Diagnosis not present

## 2022-02-16 DIAGNOSIS — M9903 Segmental and somatic dysfunction of lumbar region: Secondary | ICD-10-CM | POA: Diagnosis not present

## 2022-02-16 DIAGNOSIS — M9905 Segmental and somatic dysfunction of pelvic region: Secondary | ICD-10-CM | POA: Diagnosis not present

## 2022-02-18 NOTE — Telephone Encounter (Signed)
MyChart message to patient.

## 2022-02-23 ENCOUNTER — Ambulatory Visit (INDEPENDENT_AMBULATORY_CARE_PROVIDER_SITE_OTHER): Payer: BC Managed Care – PPO | Admitting: *Deleted

## 2022-02-23 DIAGNOSIS — J309 Allergic rhinitis, unspecified: Secondary | ICD-10-CM

## 2022-02-24 NOTE — Telephone Encounter (Signed)
Left message to call Yamato Kopf, RN at [facility], [phone].  

## 2022-02-25 NOTE — Telephone Encounter (Signed)
Spoke with patient. Reviewed surgery dates. Patient request to proceed with surgery on 03/29/22, declines earlier dates, currently traveling.  Advised patient I will forward to business office for return call. I will return call once surgery date and time confirmed. Patient verbalizes understanding and is agreeable.   Surgery request sent.   Routing to [employer]

## 2022-03-04 NOTE — Telephone Encounter (Signed)
Spoke with patient. Surgery date request confirmed.  Advised surgery is scheduled for 03/29/22, Monica Good at 57.  Surgery instruction sheet and hospital brochure reviewed, printed copy will be mailed.  Patient aware Dr. Quincy Simmonds will discuss DVT prophylaxis at Pre-op visit.  Patient verbalizes understanding and is agreeable.   Routing to [employer] for benefits. Encounter may be closed once benefits reviewed.

## 2022-03-08 DIAGNOSIS — M9902 Segmental and somatic dysfunction of thoracic region: Secondary | ICD-10-CM | POA: Diagnosis not present

## 2022-03-08 DIAGNOSIS — M9901 Segmental and somatic dysfunction of cervical region: Secondary | ICD-10-CM | POA: Diagnosis not present

## 2022-03-08 DIAGNOSIS — M9905 Segmental and somatic dysfunction of pelvic region: Secondary | ICD-10-CM | POA: Diagnosis not present

## 2022-03-08 DIAGNOSIS — M9903 Segmental and somatic dysfunction of lumbar region: Secondary | ICD-10-CM | POA: Diagnosis not present

## 2022-03-17 ENCOUNTER — Encounter (HOSPITAL_BASED_OUTPATIENT_CLINIC_OR_DEPARTMENT_OTHER): Payer: Self-pay | Admitting: Obstetrics and Gynecology

## 2022-03-17 NOTE — Progress Notes (Signed)
Spoke w/ via phone for pre-op interview--- pt Lab needs dos----  [employer], ekg  (per anes)/  pre-op orders pending           Lab results------ no COVID test -----patient states asymptomatic no test needed Arrive at ------- 0530 on 03-29-2022 NPO after MN NO Solid Food.  Clear liquids from MN until--- 0430 Med rec completed Medications to take morning of surgery ----- arnuity inhaler Diabetic medication ----- do not do ozempic injection Sunday prior to surgery Patient instructed no nail polish to be worn day of surgery Patient instructed to bring photo id and insurance card day of surgery Patient aware to have Driver (ride ) / caregiver for 24 hours after surgery -- husband, torbjorn Patient Special Instructions ----- asked to bring rescue inhaler dos Pre-Op special Istructions ----- sent inbox message to dr Quincy Simmonds in epic, requested orders Patient verbalized understanding of instructions that were given at this phone interview. Patient denies shortness of breath, chest pain, fever, cough at this phone interview.

## 2022-03-17 NOTE — Progress Notes (Signed)
GYNECOLOGY  VISIT   HPI: 56 y.o.   Married  Caucasian  female   [phone] with Patient's last menstrual period was 06/14/2016 (approximate).   here for surgical consult.  D&C/hysteroscopy with myosure.   Patient has had recurrent postmenopausal bleeding.   Pelvic US 01/01/22 at [facility] showed an endometrial thickness of 12 mm.  Endometrial biopsy 01/04/22 showed benign proliferative endometrium, negative for hyperplasia and malignancy.   She presented for a sonohysterogram on 02/03/22.  The procedure was not performed due to the findings on the ultrasound which appeared highly suggestive of an endometrial mass. Pelvic US Uterus 7.97 x 5.83 x 4.24 cm.  EMS 7.9 mm.  Endometrial mass 13 x 6 mm.  Feeder vessel noted.  Left ovary 2.41 x 1.33 x 1.02 cm, atrophic.  Right ovary 2.58 x 1.33 x 0.93 cm, atrophic.  No adnexal masses.  No free fluid.   She had a hysteroscopic polypectomy with dilation and curettage on 10/20/20 for postmenopausal bleeding.  Final pathology showed a benign endometrioid polyp.  She has returned from a visit to see family in [place].  GYNECOLOGIC HISTORY: Patient's last menstrual period was 06/14/2016 (approximate). Contraception:  pmp Menopausal hormone therapy:  none Last mammogram:  08-31-21 category b density birads 1:neg Last pap smear:   12-30-21 ascus HPV hr neg, 07-03-20 neg HPV HR neg        OB History     Gravida  4   Para  4   Term  4   Preterm      AB      Living  4      SAB      IAB      Ectopic      Multiple      Live Births  4              Patient Active Problem List   Diagnosis Date Noted   Mild intermittent asthma without complication 19/62/2297   Coughing 11/24/2017   Globus sensation 11/24/2017   Laryngopharyngeal reflux (LPR) 11/24/2017   Allergic reaction 03/02/2017   Contact dermatitis due to chemicals 09/22/2016   Seasonal and perennial allergic rhinitis 09/01/2015   Mild persistent asthma 09/01/2015    Food Allergy 09/01/2015   Cystocele 11/02/2013    Past Medical History:  Diagnosis Date   Arthritis    Eczema    Endometrial mass    Environmental allergies    GERD (gastroesophageal reflux disease)    prn tums and watches diet   Hashimoto's disease    followed by dr Modena Nunnery,  takes low dose naltrexone   History of DVT of lower extremity 1995   left thigh,  completed one year blood thinner   Hypertension    followed by pcp   Moderate persistent asthma without complication    followed by dr Mamie Nick. [facility]gett [facility]   PMB (postmenopausal bleeding)    Seasonal and perennial allergic rhinitis    Type 2 diabetes mellitus ([place])    followed by pcp   Wears glasses     Past Surgical History:  Procedure Laterality Date   DILATATION & CURETTAGE/HYSTEROSCOPY WITH MYOSURE N/A 10/20/2020   Procedure: DILATATION & CURETTAGE/HYSTEROSCOPY WITH MYOSURE polypectomy;  Surgeon: Nunzio Cobbs, MD;  Location: [facility];  Service: Gynecology;  Laterality: N/A;   HAND SURGERY Right 2006  @[facility]    abscess   KNEE ARTHROSCOPY Left 04-27-2010   @[facility]    LAPAROSCOPY  1991  diagnostic    SHOULDER ARTHROSCOPY Right 2004    Current Outpatient Medications  Medication Sig Dispense Refill   Acetylcysteine (NAC PO) Take 900 mg by mouth 2 (two) times daily.     albuterol (PROVENTIL HFA) 108 (90 Base) MCG/ACT inhaler Inhale 2 puffs into the lungs every 4 (four) hours as needed. (Patient taking differently: Inhale 2 puffs into the lungs every 4 (four) hours as needed for shortness of breath or wheezing.) 18 g 1   buPROPion (WELLBUTRIN XL) 300 MG 24 hr tablet Take 300 mg by mouth at bedtime.     calcium carbonate (TUMS - DOSED IN MG ELEMENTAL CALCIUM) 500 MG chewable tablet Chew 1 tablet by mouth as needed for indigestion or heartburn.     EPINEPHrine 0.3 mg/0.3 mL IJ SOAJ injection Inject 0.3 mg into the muscle as needed. 2 each 1   Fluticasone Furoate (ARNUITY  ELLIPTA) 100 MCG/ACT AEPB Inhale 1 puff into the lungs daily. (Patient taking differently: Inhale 1 puff into the lungs daily.) 90 each 1   losartan (COZAAR) 50 MG tablet Take 50 mg by mouth 2 (two) times daily.  5   MAGNESIUM PO Take 120 mg by mouth 2 (two) times daily.     Melatonin 5 MG CAPS Take 1 capsule by mouth at bedtime as needed.     metFORMIN (GLUCOPHAGE) 1000 MG tablet      Misc Natural Products (T-RELIEF CBD+13 SL) Place 600 mg under the tongue daily. Hemp/ cbd one under tongue daily     Multiple Vitamin (MULTIVITAMIN) capsule Take 1 capsule by mouth daily.     NALTREXONE HCL PO Take 4.5 mg by mouth 2 (two) times daily.     omeprazole (PRILOSEC OTC) 20 MG tablet 1 tablet 30 minutes before meal (Patient taking differently: Take 20 mg by mouth at bedtime. 1 tablet 30 minutes before meal) 30 tablet 0   OZEMPIC, 1 MG/DOSE, 4 MG/3ML SOPN Inject 1 mg into the skin once a week. Sunday's     PRESCRIPTION MEDICATION every 30 (thirty) days. Allergy shots     Probiotic Product (PROBIOTIC DAILY) CAPS Take 1 capsule by mouth daily.     rivaroxaban (XARELTO) 10 MG TABS tablet Take 1 tablet (10 mg total) by mouth daily. Take 1 tablet by mouth on Friday and on Saturday prior to surgery.  Do not take the day prior to surgery and the day of surgery.  You may resume one tablet by mouth daily for 10 days, starting the day after surgery. 12 tablet 0   Vitamin A 3 MG (10000 UT) TABS Take 1 capsule by mouth 2 (two) times a week.     No current facility-administered medications for this visit.     ALLERGIES: Alpha-gal, Beef-derived products, Fish allergy, Lambs quarters, Other, Pork-derived products, Shellfish allergy, Bee venom, Latex, and Peanut oil  Family History  Problem Relation Age of Onset   Arthritis Mother    Hypertension Mother    Diabetes Mother    Allergic rhinitis Neg Hx    Angioedema Neg Hx    Eczema Neg Hx    Asthma Neg Hx    Immunodeficiency Neg Hx    Urticaria Neg Hx      Social History   Socioeconomic History   Marital status: Married    Spouse name: Not on file   Number of children: Not on file   Years of education: Not on file   Highest education level: Not on file  Occupational History  Not on file  Tobacco Use   Smoking status: Never   Smokeless tobacco: Never  Vaping Use   Vaping Use: Never used  Substance and Sexual Activity   Alcohol use: Not Currently    Comment: very rare   Drug use: Never   Sexual activity: Not on file  Other Topics Concern   Not on file  Social History Narrative   Not on file   Social Determinants of Health   Financial Resource Strain: Not on file  Food Insecurity: Not on file  Transportation Needs: Not on file  Physical Activity: Not on file  Stress: Not on file  Social Connections: Not on file  Intimate Partner Violence: Not on file    Review of Systems  All other systems reviewed and are negative.   PHYSICAL EXAMINATION:    BP 114/78   Pulse 72   LMP 06/14/2016 (Approximate)   SpO2 92%     General appearance: alert, cooperative and appears stated age Head: Normocephalic, without obvious abnormality, atraumatic Neck: no adenopathy, supple, symmetrical, trachea midline and thyroid normal to inspection and palpation Lungs: clear to auscultation bilaterally Heart: regular rate and rhythm Abdomen: soft, non-tender, no masses,  no organomegaly Extremities: extremities normal, atraumatic, no cyanosis or edema Skin: Skin color, texture, turgor normal. No rashes or lesions No abnormal inguinal nodes palpated Neurologic: Grossly normal  Pelvic: External genitalia:  no lesions              Urethra:  normal appearing urethra with no masses, tenderness or lesions              Bartholins and Skenes: normal                 Vagina: normal appearing vagina with normal color and discharge, no lesions              Cervix: no lesions.  Blood from os.                 Bimanual Exam:  Uterus:  normal size,  contour, position, consistency, mobility, non-tender              Adnexa: no mass, fullness, tenderness            Chaperone was present for exam:  yes.  ASSESSMENT  Postmenopausal bleeding.  Endometrial mass. Hx DVT.  Unable to take Lovenox.  Shellfish and latex allergies.  PLAN  Discussion of postmenopausal bleeding and endometrial mass. Discussion of hysteroscopy with Myosure polypectomy, dilation and curettage.  Risks, benefits, and alternatives have been reviewed. Risks include but are not limited to bleeding, infection, damage to surrounding organs including uterine perforation, reaction to anesthesia, DVT, PE, death, and need for further treatment and surgery. Surgical expectations and recovery discussed.  She states she does not have questions about the procedure. Patient wishes to proceed.  Needs Xarelto pre and post procedure.  She will take 10 mg for 2 days prior to the procedure, not take the medication the day prior to the surgery and the day of the surgery and then resume the day after surgery and take for 10 days post op.  Protocol reviewed with the patient.    An After Visit Summary was printed and given to the patient.

## 2022-03-18 ENCOUNTER — Ambulatory Visit (INDEPENDENT_AMBULATORY_CARE_PROVIDER_SITE_OTHER): Payer: BC Managed Care – PPO | Admitting: Obstetrics and Gynecology

## 2022-03-18 ENCOUNTER — Encounter: Payer: Self-pay | Admitting: Obstetrics and Gynecology

## 2022-03-18 VITALS — BP 114/78 | HR 72

## 2022-03-18 DIAGNOSIS — N9489 Other specified conditions associated with female genital organs and menstrual cycle: Secondary | ICD-10-CM

## 2022-03-18 DIAGNOSIS — N950 Postmenopausal bleeding: Secondary | ICD-10-CM

## 2022-03-18 DIAGNOSIS — Z86718 Personal history of other venous thrombosis and embolism: Secondary | ICD-10-CM

## 2022-03-18 MED ORDER — RIVAROXABAN 10 MG PO TABS: 10.0000 mg | ORAL_TABLET | Freq: Every day | ORAL | 0 refills | Status: DC

## 2022-03-20 NOTE — H&P (Signed)
Office Visit  03/18/2022 [facility], MD Obstetrics and Gynecology Postmenopausal bleeding +2 more Dx Referred by Carol Ada, MD Reason for Visit   Additional Documentation  Vitals:  BP 114/78 Pulse 72 LMP 06/14/2016 (Approximate) SpO2 92%  Flowsheets:  NEWS, MEWS Score, Method of Visit   Encounter Info:  Billing Info, History, Allergies, Detailed Report    All Notes    Progress Notes by Nunzio Cobbs, MD at 03/18/2022 4:30 PM  Author: Nunzio Cobbs, MD Author Type: Physician Filed: 03/20/2022  1:40 PM  Note Status: Signed Cosign: Cosign Not Required Encounter Date: 03/18/2022  Editor: Nunzio Cobbs, MD (Physician)      Prior Versions: 1. Netta Corrigan, CMA (Certified Medical Assistant) at 03/18/2022  4:47 PM - Sign when Signing Visit   2. Susy Manor, CMA (Physiological scientist) at 03/17/2022 10:37 AM - Sign when Signing Visit    GYNECOLOGY  VISIT   HPI: 56 y.o.   Married  Caucasian  female   [phone] with Patient's last menstrual period was 06/14/2016 (approximate).   here for surgical consult.  D&C/hysteroscopy with myosure.    Patient has had recurrent postmenopausal bleeding.    Pelvic US 01/01/22 at [facility] showed an endometrial thickness of 12 mm.  Endometrial biopsy 01/04/22 showed benign proliferative endometrium, negative for hyperplasia and malignancy.    She presented for a sonohysterogram on 02/03/22.  The procedure was not performed due to the findings on the ultrasound which appeared highly suggestive of an endometrial mass. Pelvic US Uterus 7.97 x 5.83 x 4.24 cm.  EMS 7.9 mm.  Endometrial mass 13 x 6 mm.  Feeder vessel noted.  Left ovary 2.41 x 1.33 x 1.02 cm, atrophic.  Right ovary 2.58 x 1.33 x 0.93 cm, atrophic.  No adnexal masses.  No free fluid.   She had a hysteroscopic polypectomy with dilation and curettage on 10/20/20 for  postmenopausal bleeding.  Final pathology showed a benign endometrioid polyp.   She has returned from a visit to see family in [place].   GYNECOLOGIC HISTORY: Patient's last menstrual period was 06/14/2016 (approximate). Contraception:  pmp Menopausal hormone therapy:  none Last mammogram:  08-31-21 category b density birads 1:neg Last pap smear:   12-30-21 ascus HPV hr neg, 07-03-20 neg HPV HR neg        OB History       Gravida  4   Para  4   Term  4   Preterm      AB      Living  4        SAB      IAB      Ectopic      Multiple      Live Births  4                     Patient Active Problem List    Diagnosis Date Noted   Mild intermittent asthma without complication 38/03/1750   Coughing 11/24/2017   Globus sensation 11/24/2017   Laryngopharyngeal reflux (LPR) 11/24/2017   Allergic reaction 03/02/2017   Contact dermatitis due to chemicals 09/22/2016   Seasonal and perennial allergic rhinitis 09/01/2015   Mild persistent asthma 09/01/2015   Food Allergy 09/01/2015   Cystocele 11/02/2013          Past Medical History:  Diagnosis Date   Arthritis  Eczema     Endometrial mass     Environmental allergies     GERD (gastroesophageal reflux disease)      prn tums and watches diet   Hashimoto's disease      followed by dr Modena Nunnery,  takes low dose naltrexone   History of DVT of lower extremity 1995    left thigh,  completed one year blood thinner   Hypertension      followed by pcp   Moderate persistent asthma without complication      followed by dr Mamie Nick. padgett ([facility]   PMB (postmenopausal bleeding)     Seasonal and perennial allergic rhinitis     Type 2 diabetes mellitus ([place])      followed by pcp   Wears glasses             Past Surgical History:  Procedure Laterality Date   DILATATION & CURETTAGE/HYSTEROSCOPY WITH MYOSURE N/A 10/20/2020    Procedure: DILATATION & CURETTAGE/HYSTEROSCOPY WITH MYOSURE polypectomy;   Surgeon: Nunzio Cobbs, MD;  Location: [facility];  Service: Gynecology;  Laterality: N/A;   HAND SURGERY Right 2006  @[facility]     abscess   KNEE ARTHROSCOPY Left 04-27-2010   @[facility]    LAPAROSCOPY   1991    diagnostic    SHOULDER ARTHROSCOPY Right 2004            Current Outpatient Medications  Medication Sig Dispense Refill   Acetylcysteine (NAC PO) Take 900 mg by mouth 2 (two) times daily.       albuterol (PROVENTIL HFA) 108 (90 Base) MCG/ACT inhaler Inhale 2 puffs into the lungs every 4 (four) hours as needed. (Patient taking differently: Inhale 2 puffs into the lungs every 4 (four) hours as needed for shortness of breath or wheezing.) 18 g 1   buPROPion (WELLBUTRIN XL) 300 MG 24 hr tablet Take 300 mg by mouth at bedtime.       calcium carbonate (TUMS - DOSED IN MG ELEMENTAL CALCIUM) 500 MG chewable tablet Chew 1 tablet by mouth as needed for indigestion or heartburn.       EPINEPHrine 0.3 mg/0.3 mL IJ SOAJ injection Inject 0.3 mg into the muscle as needed. 2 each 1   Fluticasone Furoate (ARNUITY ELLIPTA) 100 MCG/ACT AEPB Inhale 1 puff into the lungs daily. (Patient taking differently: Inhale 1 puff into the lungs daily.) 90 each 1   losartan (COZAAR) 50 MG tablet Take 50 mg by mouth 2 (two) times daily.   5   MAGNESIUM PO Take 120 mg by mouth 2 (two) times daily.       Melatonin 5 MG CAPS Take 1 capsule by mouth at bedtime as needed.       metFORMIN (GLUCOPHAGE) 1000 MG tablet         Misc Natural Products (T-RELIEF CBD+13 SL) Place 600 mg under the tongue daily. Hemp/ cbd one under tongue daily       Multiple Vitamin (MULTIVITAMIN) capsule Take 1 capsule by mouth daily.       NALTREXONE HCL PO Take 4.5 mg by mouth 2 (two) times daily.       omeprazole (PRILOSEC OTC) 20 MG tablet 1 tablet 30 minutes before meal (Patient taking differently: Take 20 mg by mouth at bedtime. 1 tablet 30 minutes before meal) 30 tablet 0   OZEMPIC, 1 MG/DOSE, 4 MG/3ML SOPN Inject 1 mg  into the skin once a week. Sunday's  PRESCRIPTION MEDICATION every 30 (thirty) days. Allergy shots       Probiotic Product (PROBIOTIC DAILY) CAPS Take 1 capsule by mouth daily.       rivaroxaban (XARELTO) 10 MG TABS tablet Take 1 tablet (10 mg total) by mouth daily. Take 1 tablet by mouth on Friday and on Saturday prior to surgery.  Do not take the day prior to surgery and the day of surgery.  You may resume one tablet by mouth daily for 10 days, starting the day after surgery. 12 tablet 0   Vitamin A 3 MG (10000 UT) TABS Take 1 capsule by mouth 2 (two) times a week.        No current facility-administered medications for this visit.      ALLERGIES: Alpha-gal, Beef-derived products, Fish allergy, Lambs quarters, Other, Pork-derived products, Shellfish allergy, Bee venom, Latex, and Peanut oil        Family History  Problem Relation Age of Onset   Arthritis Mother     Hypertension Mother     Diabetes Mother     Allergic rhinitis Neg Hx     Angioedema Neg Hx     Eczema Neg Hx     Asthma Neg Hx     Immunodeficiency Neg Hx     Urticaria Neg Hx        Social History         Socioeconomic History   Marital status: Married      Spouse name: Not on file   Number of children: Not on file   Years of education: Not on file   Highest education level: Not on file  Occupational History   Not on file  Tobacco Use   Smoking status: Never   Smokeless tobacco: Never  Vaping Use   Vaping Use: Never used  Substance and Sexual Activity   Alcohol use: Not Currently      Comment: very rare   Drug use: Never   Sexual activity: Not on file  Other Topics Concern   Not on file  Social History Narrative   Not on file    Social Determinants of Health    Financial Resource Strain: Not on file  Food Insecurity: Not on file  Transportation Needs: Not on file  Physical Activity: Not on file  Stress: Not on file  Social Connections: Not on file  Intimate Partner Violence: Not on file       Review of Systems  All other systems reviewed and are negative.     PHYSICAL EXAMINATION:     BP 114/78   Pulse 72   LMP 06/14/2016 (Approximate)   SpO2 92%     General appearance: alert, cooperative and appears stated age Head: Normocephalic, without obvious abnormality, atraumatic Neck: no adenopathy, supple, symmetrical, trachea midline and thyroid normal to inspection and palpation Lungs: clear to auscultation bilaterally Heart: regular rate and rhythm Abdomen: soft, non-tender, no masses,  no organomegaly Extremities: extremities normal, atraumatic, no cyanosis or edema Skin: Skin color, texture, turgor normal. No rashes or lesions No abnormal inguinal nodes palpated Neurologic: Grossly normal   Pelvic: External genitalia:  no lesions              Urethra:  normal appearing urethra with no masses, tenderness or lesions              Bartholins and Skenes: normal                 Vagina: normal appearing vagina  with normal color and discharge, no lesions              Cervix: no lesions.  Blood from os.                 Bimanual Exam:  Uterus:  normal size, contour, position, consistency, mobility, non-tender              Adnexa: no mass, fullness, tenderness            Chaperone was present for exam:  yes.   ASSESSMENT   Postmenopausal bleeding.  Endometrial mass. Hx DVT.  Unable to take Lovenox.  Shellfish and latex allergies.   PLAN   Discussion of postmenopausal bleeding and endometrial mass. Discussion of hysteroscopy with Myosure polypectomy, dilation and curettage.  Risks, benefits, and alternatives have been reviewed. Risks include but are not limited to bleeding, infection, damage to surrounding organs including uterine perforation, reaction to anesthesia, DVT, PE, death, and need for further treatment and surgery. Surgical expectations and recovery discussed.  She states she does not have questions about the procedure. Patient wishes to proceed.    Needs Xarelto pre and post procedure.  She will take 10 mg for 2 days prior to the procedure, not take the medication the day prior to the surgery and the day of the surgery and then resume the day after surgery and take for 10 days post op.  Protocol reviewed with the patient.    An After Visit Summary was printed and given to the patient.

## 2022-03-23 ENCOUNTER — Ambulatory Visit (INDEPENDENT_AMBULATORY_CARE_PROVIDER_SITE_OTHER): Payer: BC Managed Care – PPO | Admitting: *Deleted

## 2022-03-23 DIAGNOSIS — J309 Allergic rhinitis, unspecified: Secondary | ICD-10-CM

## 2022-03-26 DIAGNOSIS — M9903 Segmental and somatic dysfunction of lumbar region: Secondary | ICD-10-CM | POA: Diagnosis not present

## 2022-03-26 DIAGNOSIS — M9902 Segmental and somatic dysfunction of thoracic region: Secondary | ICD-10-CM | POA: Diagnosis not present

## 2022-03-26 DIAGNOSIS — M9905 Segmental and somatic dysfunction of pelvic region: Secondary | ICD-10-CM | POA: Diagnosis not present

## 2022-03-26 DIAGNOSIS — M9901 Segmental and somatic dysfunction of cervical region: Secondary | ICD-10-CM | POA: Diagnosis not present

## 2022-03-29 ENCOUNTER — Ambulatory Visit (HOSPITAL_BASED_OUTPATIENT_CLINIC_OR_DEPARTMENT_OTHER): Payer: BC Managed Care – PPO | Admitting: Anesthesiology

## 2022-03-29 ENCOUNTER — Other Ambulatory Visit: Payer: Self-pay

## 2022-03-29 ENCOUNTER — Encounter (HOSPITAL_BASED_OUTPATIENT_CLINIC_OR_DEPARTMENT_OTHER): Payer: Self-pay | Admitting: Obstetrics and Gynecology

## 2022-03-29 ENCOUNTER — Encounter (HOSPITAL_BASED_OUTPATIENT_CLINIC_OR_DEPARTMENT_OTHER)
Admission: RE | Disposition: A | Discharge status: Home / Self Care | Payer: Self-pay | Source: Home / Self Care | Attending: Obstetrics and Gynecology

## 2022-03-29 ENCOUNTER — Ambulatory Visit (HOSPITAL_BASED_OUTPATIENT_CLINIC_OR_DEPARTMENT_OTHER)
Admission: RE | Admit: 2022-03-29 | Discharge: 2022-03-29 | Disposition: A | Discharge status: Home / Self Care | Payer: BC Managed Care – PPO | Attending: Obstetrics and Gynecology | Admitting: Obstetrics and Gynecology

## 2022-03-29 DIAGNOSIS — Z7984 Long term (current) use of oral hypoglycemic drugs: Secondary | ICD-10-CM | POA: Insufficient documentation

## 2022-03-29 DIAGNOSIS — Z7901 Long term (current) use of anticoagulants: Secondary | ICD-10-CM | POA: Insufficient documentation

## 2022-03-29 DIAGNOSIS — N9489 Other specified conditions associated with female genital organs and menstrual cycle: Secondary | ICD-10-CM

## 2022-03-29 DIAGNOSIS — N840 Polyp of corpus uteri: Secondary | ICD-10-CM | POA: Insufficient documentation

## 2022-03-29 DIAGNOSIS — N950 Postmenopausal bleeding: Secondary | ICD-10-CM

## 2022-03-29 DIAGNOSIS — I10 Essential (primary) hypertension: Secondary | ICD-10-CM | POA: Diagnosis not present

## 2022-03-29 DIAGNOSIS — E119 Type 2 diabetes mellitus without complications: Secondary | ICD-10-CM | POA: Insufficient documentation

## 2022-03-29 DIAGNOSIS — Z01818 Encounter for other preprocedural examination: Secondary | ICD-10-CM

## 2022-03-29 DIAGNOSIS — M1990 Unspecified osteoarthritis, unspecified site: Secondary | ICD-10-CM | POA: Diagnosis not present

## 2022-03-29 DIAGNOSIS — D250 Submucous leiomyoma of uterus: Secondary | ICD-10-CM | POA: Diagnosis not present

## 2022-03-29 DIAGNOSIS — J45909 Unspecified asthma, uncomplicated: Secondary | ICD-10-CM | POA: Diagnosis not present

## 2022-03-29 DIAGNOSIS — K219 Gastro-esophageal reflux disease without esophagitis: Secondary | ICD-10-CM | POA: Diagnosis not present

## 2022-03-29 HISTORY — DX: Other specified conditions associated with female genital organs and menstrual cycle: N94.89

## 2022-03-29 HISTORY — DX: Moderate persistent asthma, uncomplicated: J45.40

## 2022-03-29 HISTORY — PX: DILATATION & CURETTAGE/HYSTEROSCOPY WITH MYOSURE: SHX6511

## 2022-03-29 HISTORY — DX: Other seasonal allergic rhinitis: J30.2

## 2022-03-29 HISTORY — DX: Other allergy status, other than to drugs and biological substances: Z91.09

## 2022-03-29 HISTORY — DX: Type 2 diabetes mellitus without complications: E11.9

## 2022-03-29 LAB — BASIC METABOLIC PANEL
Anion gap: 7 (ref 5–15)
BUN: 14 mg/dL (ref 6–20)
CO2: 24 mmol/L (ref 22–32)
Calcium: 9.2 mg/dL (ref 8.9–10.3)
Chloride: 108 mmol/L (ref 98–111)
Creatinine, Ser: 0.81 mg/dL (ref 0.44–1.00)
GFR, Estimated: >60 mL/min (ref >60)
Glucose, Bld: 101 mg/dL — ABNORMAL HIGH (ref 70–99)
Potassium: 3.6 mmol/L (ref 3.5–5.1)
Sodium: 139 mmol/L (ref 135–145)

## 2022-03-29 LAB — CBC
HCT: 41.7 % (ref 36.0–46.0)
Hemoglobin: 14.2 g/dL (ref 12.0–15.0)
MCH: 31.6 pg (ref 26.0–34.0)
MCHC: 34.1 g/dL (ref 30.0–36.0)
MCV: 92.9 fL (ref 80.0–100.0)
Platelets: 265 10*3/uL (ref 150–400)
RBC: 4.49 MIL/uL (ref 3.87–5.11)
RDW: 12.3 % (ref 11.5–15.5)
WBC: 7 10*3/uL (ref 4.0–10.5)
nRBC: 0 % (ref 0.0–0.2)

## 2022-03-29 LAB — TYPE AND SCREEN
ABO/RH(D): O POS
Antibody Screen: NEGATIVE

## 2022-03-29 LAB — GLUCOSE, CAPILLARY
Glucose-Capillary: 107 mg/dL — ABNORMAL HIGH (ref 70–99)
Glucose-Capillary: 90 mg/dL (ref 70–99)

## 2022-03-29 SURGERY — DILATATION & CURETTAGE/HYSTEROSCOPY WITH MYOSURE
Anesthesia: General | Site: Uterus

## 2022-03-29 MED ORDER — PROPOFOL 10 MG/ML IV BOLUS
INTRAVENOUS | Status: DC | PRN
  Administered 2022-03-29: 200 mg via INTRAVENOUS

## 2022-03-29 MED ORDER — PROMETHAZINE HCL 25 MG/ML IJ SOLN: 6.2500 mg | INTRAMUSCULAR | Status: DC | PRN

## 2022-03-29 MED ORDER — AMISULPRIDE (ANTIEMETIC) 5 MG/2ML IV SOLN: 10.0000 mg | Freq: Once | INTRAVENOUS | Status: DC | PRN

## 2022-03-29 MED ORDER — ACETAMINOPHEN 500 MG PO TABS
1000.0000 mg | ORAL_TABLET | ORAL | Status: AC
  Administered 2022-03-29: 1000 mg via ORAL

## 2022-03-29 MED ORDER — OXYCODONE HCL 5 MG/5ML PO SOLN: 5.0000 mg | Freq: Once | ORAL | Status: DC | PRN

## 2022-03-29 MED ORDER — MIDAZOLAM HCL 5 MG/5ML IJ SOLN
INTRAMUSCULAR | Status: DC | PRN
  Administered 2022-03-29: 2 mg via INTRAVENOUS

## 2022-03-29 MED ORDER — ACETAMINOPHEN 500 MG PO TABS
ORAL_TABLET | ORAL | Status: AC
  Filled 2022-03-29: qty 2

## 2022-03-29 MED ORDER — LACTATED RINGERS IV SOLN: INTRAVENOUS | Status: DC

## 2022-03-29 MED ORDER — SODIUM CHLORIDE 0.9 % IR SOLN
Status: DC | PRN
  Administered 2022-03-29: 3000 mL

## 2022-03-29 MED ORDER — DEXAMETHASONE SODIUM PHOSPHATE 10 MG/ML IJ SOLN
INTRAMUSCULAR | Status: AC
  Filled 2022-03-29: qty 1

## 2022-03-29 MED ORDER — OXYCODONE HCL 5 MG PO TABS: 5.0000 mg | ORAL_TABLET | Freq: Once | ORAL | Status: DC | PRN

## 2022-03-29 MED ORDER — SILVER NITRATE-POT NITRATE 75-25 % EX MISC
CUTANEOUS | Status: DC | PRN
  Administered 2022-03-29: 1

## 2022-03-29 MED ORDER — DEXAMETHASONE SODIUM PHOSPHATE 10 MG/ML IJ SOLN
INTRAMUSCULAR | Status: DC | PRN
  Administered 2022-03-29: 10 mg via INTRAVENOUS

## 2022-03-29 MED ORDER — ONDANSETRON HCL 4 MG/2ML IJ SOLN
INTRAMUSCULAR | Status: AC
  Filled 2022-03-29: qty 2

## 2022-03-29 MED ORDER — PROPOFOL 10 MG/ML IV BOLUS
INTRAVENOUS | Status: AC
  Filled 2022-03-29: qty 20

## 2022-03-29 MED ORDER — FENTANYL CITRATE (PF) 100 MCG/2ML IJ SOLN
INTRAMUSCULAR | Status: DC | PRN
  Administered 2022-03-29: 50 ug via INTRAVENOUS

## 2022-03-29 MED ORDER — ONDANSETRON HCL 4 MG/2ML IJ SOLN
INTRAMUSCULAR | Status: DC | PRN
  Administered 2022-03-29: 4 mg via INTRAVENOUS

## 2022-03-29 MED ORDER — LIDOCAINE 2% (20 MG/ML) 5 ML SYRINGE
INTRAMUSCULAR | Status: DC | PRN
  Administered 2022-03-29: 60 mg via INTRAVENOUS

## 2022-03-29 MED ORDER — LIDOCAINE HCL 1 % IJ SOLN
INTRAMUSCULAR | Status: DC | PRN
  Administered 2022-03-29: 10 mL

## 2022-03-29 MED ORDER — HYDROMORPHONE HCL 1 MG/ML IJ SOLN: 0.2500 mg | INTRAMUSCULAR | Status: DC | PRN

## 2022-03-29 MED ORDER — MEPERIDINE HCL 25 MG/ML IJ SOLN: 6.2500 mg | INTRAMUSCULAR | Status: DC | PRN

## 2022-03-29 MED ORDER — ARTIFICIAL TEARS OPHTHALMIC OINT
TOPICAL_OINTMENT | OPHTHALMIC | Status: AC
  Filled 2022-03-29: qty 3.5

## 2022-03-29 MED ORDER — LIDOCAINE HCL (PF) 2 % IJ SOLN
INTRAMUSCULAR | Status: AC
  Filled 2022-03-29: qty 5

## 2022-03-29 MED ORDER — MIDAZOLAM HCL 2 MG/2ML IJ SOLN
INTRAMUSCULAR | Status: AC
  Filled 2022-03-29: qty 2

## 2022-03-29 MED ORDER — FENTANYL CITRATE (PF) 100 MCG/2ML IJ SOLN
INTRAMUSCULAR | Status: AC
  Filled 2022-03-29: qty 2

## 2022-03-29 SURGICAL SUPPLY — 14 items
CATH SILICONE 16FRX5CC (CATHETERS) IMPLANT
DEVICE MYOSURE REACH (MISCELLANEOUS) IMPLANT
GLOVE BIOGEL PI IND STRL 6.5 (GLOVE) IMPLANT
GLOVE BIOGEL PI IND STRL 7.0 (GLOVE) IMPLANT
GLOVE SURG SS PI 6.5 STRL IVOR (GLOVE) IMPLANT
GOWN STRL REUS W/ TWL LRG LVL3 (GOWN DISPOSABLE) IMPLANT
GOWN STRL REUS W/TWL LRG LVL3 (GOWN DISPOSABLE) ×3 IMPLANT
IV NS IRRIG 3000ML ARTHROMATIC (IV SOLUTION) ×1 IMPLANT
KIT PROCEDURE FLUENT (KITS) ×1 IMPLANT
KIT TURNOVER CYSTO (KITS) ×1 IMPLANT
PACK VAGINAL MINOR WOMEN LF (CUSTOM PROCEDURE TRAY) ×1 IMPLANT
PAD OB MATERNITY 4.3X12.25 (PERSONAL CARE ITEMS) ×1 IMPLANT
SEAL ROD LENS SCOPE MYOSURE (ABLATOR) ×1 IMPLANT
TOWEL OR 17X26 10 PK STRL BLUE (TOWEL DISPOSABLE) ×1 IMPLANT

## 2022-03-29 NOTE — Op Note (Signed)
OPERATIVE REPORT  PREOPERATIVE DIAGNOSES:   Postmenopausal bleeding, endometrial mass  POSTOPERATIVE DIAGNOSES:   Postmenopausal bleeding, endometrial polyps  PROCEDURE:  Hysteroscopy with dilation and curettage and Myosure resection of endometrial polyps  SURGEON:  Lenard Galloway, MD  ANESTHESIA:  LMA, paracervical block with 10 mL of 1% lidocaine.  IV FLUIDS:  400 cc LR  EBL:  10 cc  URINE OUTPUT:   Patient voided prior to procedure.   NORMAL SALINE DEFICIT:   65 cc  COMPLICATIONS:  None.  INDICATIONS FOR THE PROCEDURE:     The patient is a 56 year old G19P4 Caucasian female who presents with recurrent postmenopausal bleeding.  Pelvic ultrasound showed an endometrial stripe of 12 mm.  An endometrial biopsy showed proliferative endometrium, negative for hyperplasia and malignancy. A sonohysterogram was intended but the ultrasound prior to this revealed a 13 x 6 mm endometrial mass with a feeder vessel.  The sonohyserogram was not performed, and a decision was made to proceed with a hysteroscopy, dilation and curettage, and Myosurgical resection of the endometrial mass; after risks, benefits and alternatives were reviewed.  FINDINGS:  Exam under anesthesia revealed a small anteverted, mobile uterus.  No adnexal masses were noted.  The uterus was sounded to almost 4 inches.  Hysteroscopy showed three polyps inside the uterine cavity.  There was a 1 cm polyp of the right posterior fundus, a 5 mm polyp of the right anterior fundus, and a 2 - 3 mm polyp of the left cornua.  The tubal ostia regions were otherwise unremarkable.   Endometrial currettings were scant.  SPECIMENS:   The endometrial polyps were sent to pathology separately from the endometrial curettings.   PROCEDURE IN DETAIL:  The patient was reidentified in the preoperative hold area.  She received TED hose and PAS stockings for DVT prophylaxis.  In the operating room, the patient was placed in the dorsal  lithotomy position and then an LMA anesthetic was introduced.  The patient's lower abdomen, vagina and perineum were sterilely prepped, and she was sterilly draped.  An exam under anesthesia was performed.  A speculum was placed inside the vagina and a single-tooth tenaculum was placed on the anterior cervical lip.  A paracervical block was performed with a total of 10 mL of 1% lidocaine plain.  The uterus was sounded. The cervix was dilated to a #19 Pratt dilator.  The MyoSure hysteroscope was then inserted inside the uterine cavity under the continuous infusion of normal saline solution.   Findings are as noted above.  The Myosure Reach device was used to resect the endometrial polyps without difficulty.  They were sent to pathology together.  The MyoSure hysteroscope was removed.  The serrated and then sharp curettes were introduced into the uterine cavity and the endometrium was curetted in all 4 quadrants.   A minimal amount of endometrial curettings was obtained.  This specimen was sent to Pathology.  The single-tooth tenaculum which had been placed on the anterior cervical lip was removed.  The left tenaculum site was treated with silver nitrate to create hemostasis.     Hemostasis was then good, and all of the vaginal instruments were removed.  The patient was awakened and escorted to the recovery room in stable condition.  There were no complications to the procedure.   All needle, instrument and sponge counts were correct.  Lenard Galloway, MD

## 2022-03-29 NOTE — Progress Notes (Signed)
Update to History and Physical  Patient is taking Xarelto for DVT prophylaxis due to history of prior DVT.  Her last dose was 2 days ago.   Patient examined.  BP 173/93. OK to proceed with surgery.

## 2022-03-29 NOTE — Anesthesia Preprocedure Evaluation (Signed)
Anesthesia Evaluation  Patient identified by MRN, date of birth, ID band Patient awake    Reviewed: Allergy & Precautions, NPO status , Patient's Chart, lab work & pertinent test results  Airway Mallampati: III  TM Distance: >3 FB Neck ROM: Full    Dental no notable dental hx. (+) Teeth Intact, Caps, Dental Advisory Given   Pulmonary asthma ,    Pulmonary exam normal breath sounds clear to auscultation       Cardiovascular hypertension, Pt. on medications Normal cardiovascular exam Rhythm:Regular Rate:Normal     Neuro/Psych negative neurological ROS  negative psych ROS   GI/Hepatic Neg liver ROS, GERD  Medicated and Controlled,  Endo/Other  diabetes, Well Controlled, Type 2, Oral Hypoglycemic AgentsMorbid obesity  Renal/GU negative Renal ROS  negative genitourinary   Musculoskeletal  (+) Arthritis , Osteoarthritis,  Eczema   Abdominal (+) + obese,   Peds  Hematology Xarelto therapy- last dose this am   Anesthesia Other Findings   Reproductive/Obstetrics                             Anesthesia Physical  Anesthesia Plan  ASA: III  Anesthesia Plan: General   Post-op Pain Management: Tylenol PO (pre-op)* and Toradol IV (intra-op)*   Induction: Intravenous  PONV Risk Score and Plan: 3 and Treatment may vary due to age or medical condition, Midazolam, Ondansetron and Dexamethasone  Airway Management Planned: LMA  Additional Equipment:   Intra-op Plan:   Post-operative Plan: Extubation in OR  Informed Consent: I have reviewed the patients History and Physical, chart, labs and discussed the procedure including the risks, benefits and alternatives for the proposed anesthesia with the patient or authorized representative who has indicated his/her understanding and acceptance.     Dental advisory given  Plan Discussed with: Anesthesiologist  Anesthesia Plan Comments:          Anesthesia Quick Evaluation

## 2022-03-29 NOTE — Anesthesia Postprocedure Evaluation (Signed)
Anesthesia Post Note  Patient: Monica Good  Procedure(s) Performed: DILATATION & CURETTAGE/HYSTEROSCOPY WITH MYOSURE (Uterus)     Patient location during evaluation: PACU Anesthesia Type: General Level of consciousness: awake and alert Pain management: pain level controlled Vital Signs Assessment: post-procedure vital signs reviewed and stable Respiratory status: spontaneous breathing, nonlabored ventilation and respiratory function stable Cardiovascular status: blood pressure returned to baseline and stable Postop Assessment: no apparent nausea or vomiting Anesthetic complications: no   No notable events documented.  Last Vitals:  Vitals:   03/29/22 0830 03/29/22 0847  BP: (!) 142/77 (!) 149/6  Pulse: 68 61  Resp: 12 14  Temp:  (!) 36.4 C  SpO2: 94% 95%    Last Pain:  Vitals:   03/29/22 0847  TempSrc:   PainSc: 0-No pain                 Lynda Rainwater

## 2022-03-29 NOTE — Discharge Instructions (Addendum)
Hi Monica Good,   I found and removed 3 polyps from the uterus.  I also did the curettage of the surrounding endometrium.   Everything went well!  If you need to treat the pain, please take Tylenol regular or extra strength.  It is not recommended to take Ibuprofen or Aleve while taking the Xarelto.   Contact the office if you need anything.   I will see your for your post op visit.  [name], MD   Post Anesthesia Home Care Instructions  Activity: Get plenty of rest for the remainder of the day. A responsible individual must stay with you for 24 hours following the procedure.  For the next 24 hours, DO NOT: -Drive a car -Paediatric nurse -Drink alcoholic beverages -Take any medication unless instructed by your physician -Make any legal decisions or sign important papers.  Meals: Start with liquid foods such as gelatin or soup. Progress to regular foods as tolerated. Avoid greasy, spicy, heavy foods. If nausea and/or vomiting occur, drink only clear liquids until the nausea and/or vomiting subsides. Call your physician if vomiting continues.  Special Instructions/Symptoms: Your throat may feel dry or sore from the anesthesia or the breathing tube placed in your throat during surgery. If this causes discomfort, gargle with warm salt water. The discomfort should disappear within 24 hours.   You may take tylenol again at noon if needed.

## 2022-03-29 NOTE — Anesthesia Procedure Notes (Signed)
Procedure Name: LMA Insertion Date/Time: 03/29/2022 7:29 AM  Performed by: Rogers Blocker, CRNAPre-anesthesia Checklist: Patient identified, Emergency Drugs available, Suction available and Patient being monitored Patient Re-evaluated:Patient Re-evaluated prior to induction Oxygen Delivery Method: Circle System Utilized Preoxygenation: Pre-oxygenation with 100% oxygen Induction Type: IV induction Ventilation: Mask ventilation without difficulty LMA: LMA inserted LMA Size: 4.0 Number of attempts: 1 Placement Confirmation: positive ETCO2 Tube secured with: Tape Dental Injury: Teeth and Oropharynx as per pre-operative assessment

## 2022-03-29 NOTE — Transfer of Care (Signed)
Immediate Anesthesia Transfer of Care Note  Patient: Monica Good  Procedure(s) Performed: DILATATION & CURETTAGE/HYSTEROSCOPY WITH MYOSURE (Uterus)  Patient Location: PACU  Anesthesia Type:General  Level of Consciousness: awake, patient cooperative and responds to stimulation  Airway & Oxygen Therapy: Patient Spontanous Breathing  Post-op Assessment: Report given to RN and Post -op Vital signs reviewed and stable  Post vital signs: Reviewed and stable  Last Vitals:  Vitals Value Taken Time  BP 139/75 03/29/22 0801  Temp    Pulse 72 03/29/22 0804  Resp 10 03/29/22 0804  SpO2 96 % 03/29/22 0804  Vitals shown include unvalidated device data.  Last Pain:  Vitals:   03/29/22 0601  TempSrc: Oral  PainSc: 0-No pain      Patients Stated Pain Goal: 5 (82/50/53 9767)  Complications: No notable events documented.

## 2022-03-30 ENCOUNTER — Ambulatory Visit (INDEPENDENT_AMBULATORY_CARE_PROVIDER_SITE_OTHER): Payer: BC Managed Care – PPO

## 2022-03-30 ENCOUNTER — Encounter (HOSPITAL_BASED_OUTPATIENT_CLINIC_OR_DEPARTMENT_OTHER): Payer: Self-pay | Admitting: Obstetrics and Gynecology

## 2022-03-30 DIAGNOSIS — J309 Allergic rhinitis, unspecified: Secondary | ICD-10-CM

## 2022-03-30 LAB — SURGICAL PATHOLOGY

## 2022-04-05 NOTE — Telephone Encounter (Signed)
Spoke with patient regarding surgery benefits. Patient acknowledges understanding of information presented. Patient is aware that benefits presented are professional benefits only. Patient is aware the hospital will call with facility benefits. See account note.

## 2022-04-06 ENCOUNTER — Ambulatory Visit (INDEPENDENT_AMBULATORY_CARE_PROVIDER_SITE_OTHER): Payer: BC Managed Care – PPO | Admitting: *Deleted

## 2022-04-06 DIAGNOSIS — J309 Allergic rhinitis, unspecified: Secondary | ICD-10-CM

## 2022-04-12 ENCOUNTER — Ambulatory Visit (INDEPENDENT_AMBULATORY_CARE_PROVIDER_SITE_OTHER): Payer: BC Managed Care – PPO | Admitting: Obstetrics and Gynecology

## 2022-04-12 ENCOUNTER — Encounter: Payer: Self-pay | Admitting: Obstetrics and Gynecology

## 2022-04-12 VITALS — BP 120/80 | HR 80 | Ht 67.0 in | Wt 259.0 lb

## 2022-04-12 DIAGNOSIS — Z9889 Other specified postprocedural states: Secondary | ICD-10-CM | POA: Diagnosis not present

## 2022-04-12 DIAGNOSIS — N8500 Endometrial hyperplasia, unspecified: Secondary | ICD-10-CM

## 2022-04-12 NOTE — Progress Notes (Unsigned)
GYNECOLOGY  VISIT   HPI: 56 y.o.   Married  Caucasian  female   [phone] with Patient's last menstrual period was 06/14/2016 (approximate).   here for post op for postmenopausal bleeding and endometrial mass. DILATATION & CURETTAGE/HYSTEROSCOPY WITH MYOSURE (Uterus) Final pathology report showed:  benign endometrial polyp and proliferative endometrium.   She finished Xarelto for DVT prophylaxis.    Patient has had a Mirena IUD in the past and did well with this.  GYNECOLOGIC HISTORY: Patient's last menstrual period was 06/14/2016 (approximate). Contraception:  post nenopausal Menopausal hormone therapy:   Last mammogram:  09/02/2021 BI-RADS CAT 1 NEG Last pap smear:   12/30/2021 - Atypical squamous cells of undetermined significance (ASC-US)        OB History     Gravida  4   Para  4   Term  4   Preterm      AB      Living  4      SAB      IAB      Ectopic      Multiple      Live Births  4              Patient Active Problem List   Diagnosis Date Noted   Mild intermittent asthma without complication 03/50/0938   Coughing 11/24/2017   Globus sensation 11/24/2017   Laryngopharyngeal reflux (LPR) 11/24/2017   Allergic reaction 03/02/2017   Contact dermatitis due to chemicals 09/22/2016   Seasonal and perennial allergic rhinitis 09/01/2015   Mild persistent asthma 09/01/2015   Food Allergy 09/01/2015   Cystocele 11/02/2013    Past Medical History:  Diagnosis Date   Arthritis    Eczema    Endometrial mass    Environmental allergies    GERD (gastroesophageal reflux disease)    prn tums and watches diet   Hashimoto's disease    followed by dr Modena Nunnery,  takes low dose naltrexone   History of DVT of lower extremity 1995   left thigh,  completed one year blood thinner   Hypertension    followed by pcp   Moderate persistent asthma without complication    followed [facility] dr [facility]ie Nick. [facility]   PMB (postmenopausal bleeding)     Seasonal and perennial allergic rhinitis    Type 2 diabetes mellitus ([place])    followed by pcp   Wears glasses     Past Surgical History:  Procedure Laterality Date   DILATATION & CURETTAGE/HYSTEROSCOPY WITH MYOSURE N/A 10/20/2020   Procedure: DILATATION & CURETTAGE/HYSTEROSCOPY WITH MYOSURE polypectomy;  Surgeon: Nunzio Cobbs, MD;  Location: [facility];  Service: Gynecology;  Laterality: N/A;   DILATATION & CURETTAGE/HYSTEROSCOPY WITH MYOSURE N/A 03/29/2022   Procedure: DILATATION & CURETTAGE/HYSTEROSCOPY WITH MYOSURE;  Surgeon: Nunzio Cobbs, MD;  Location: [facility];  Service: Gynecology;  Laterality: N/A;   HAND SURGERY Right 2006  @[facility]    abscess   KNEE ARTHROSCOPY Left 04-27-2010   @[facility]    LAPAROSCOPY  1991   diagnostic    SHOULDER ARTHROSCOPY Right 2004    Current Outpatient Medications  Medication Sig Dispense Refill   Acetylcysteine (NAC PO) Take 900 mg by mouth 2 (two) times daily.     albuterol (PROVENTIL HFA) 108 (90 Base) MCG/ACT inhaler Inhale 2 puffs into the lungs every 4 (four) hours as needed. (Patient taking differently: Inhale 2 puffs into the lungs every 4 (four) hours as needed  for shortness of breath or wheezing.) 18 g 1   buPROPion (WELLBUTRIN XL) 300 MG 24 hr tablet Take 300 mg by mouth at bedtime.     calcium carbonate (TUMS - DOSED IN MG ELEMENTAL CALCIUM) 500 MG chewable tablet Chew 1 tablet by mouth as needed for indigestion or heartburn.     EPINEPHrine 0.3 mg/0.3 mL IJ SOAJ injection Inject 0.3 mg into the muscle as needed. 2 each 1   Fluticasone Furoate (ARNUITY ELLIPTA) 100 MCG/ACT AEPB Inhale 1 puff into the lungs daily. (Patient taking differently: Inhale 1 puff into the lungs daily.) 90 each 1   losartan (COZAAR) 50 MG tablet Take 50 mg by mouth 2 (two) times daily.  5   MAGNESIUM PO Take 120 mg by mouth 2 (two) times daily.     Melatonin 5 MG CAPS Take 1 capsule by mouth at bedtime as needed.      metFORMIN (GLUCOPHAGE) 1000 MG tablet      Misc Natural Products (T-RELIEF CBD+13 SL) Place 600 mg under the tongue daily. Hemp/ cbd one under tongue daily     Multiple Vitamin (MULTIVITAMIN) capsule Take 1 capsule by mouth daily.     NALTREXONE HCL PO Take 4.5 mg by mouth 2 (two) times daily.     omeprazole (PRILOSEC OTC) 20 MG tablet 1 tablet 30 minutes before meal (Patient taking differently: Take 20 mg by mouth at bedtime. 1 tablet 30 minutes before meal) 30 tablet 0   OZEMPIC, 1 MG/DOSE, 4 MG/3ML SOPN Inject 1 mg into the skin once a week. Sunday's     PRESCRIPTION MEDICATION every 30 (thirty) days. Allergy shots     Probiotic Product (PROBIOTIC DAILY) CAPS Take 1 capsule by mouth daily.     rivaroxaban (XARELTO) 10 MG TABS tablet Take 1 tablet (10 mg total) by mouth daily. Take 1 tablet by mouth on Friday and on Saturday prior to surgery.  Do not take the day prior to surgery and the day of surgery.  You may resume one tablet by mouth daily for 10 days, starting the day after surgery. 12 tablet 0   Vitamin A 3 MG (10000 UT) TABS Take 1 capsule by mouth 2 (two) times a week.     No current facility-administered medications for this visit.     ALLERGIES: Alpha-gal, Beef-derived products, Fish allergy, Lambs quarters, Other, Pork-derived products, Shellfish allergy, Bee venom, Latex, and Peanut oil  Family History  Problem Relation Age of Onset   Arthritis Mother    Hypertension Mother    Diabetes Mother    Allergic rhinitis Neg Hx    Angioedema Neg Hx    Eczema Neg Hx    Asthma Neg Hx    Immunodeficiency Neg Hx    Urticaria Neg Hx     Social History   Socioeconomic History   Marital status: Married    Spouse name: Not on file   Number of children: Not on file   Years of education: Not on file   Highest education level: Not on file  Occupational History   Not on file  Tobacco Use   Smoking status: Never   Smokeless tobacco: Never  Vaping Use   Vaping Use: Never used   Substance and Sexual Activity   Alcohol use: Yes    Comment: very rare   Drug use: Never   Sexual activity: Not Currently    Partners: Male    Birth control/protection: Post-menopausal  Other Topics Concern   Not on  file  Social History Narrative   Not on file   Social Determinants of Health   Financial Resource Strain: Not on file  Food Insecurity: Not on file  Transportation Needs: Not on file  Physical Activity: Not on file  Stress: Not on file  Social Connections: Not on file  Intimate Partner Violence: Not on file    Review of Systems  All other systems reviewed and are negative.   PHYSICAL EXAMINATION:    BP 120/80 (BP Location: Right Arm, Patient Position: Sitting, Cuff Size: Large)   Pulse 80   Ht 5' 7"  (1.702 m)   Wt 259 lb (117.5 kg)   LMP 06/14/2016 (Approximate)   BMI 40.57 kg/m     General appearance: alert, cooperative and appears stated age  Pelvic: External genitalia:  no lesions              Urethra:  normal appearing urethra with no masses, tenderness or lesions              Bartholins and Skenes: normal                 Vagina: normal appearing vagina with normal color and discharge, no lesions              Cervix: no lesions                Bimanual Exam:  Uterus:  normal size, contour, position, consistency, mobility, non-tender              Adnexa: no mass, fullness, tenderness             Chaperone was present for exam:  Eustace Pen, CMA  ASSESSMENT  Status post hysteroscopy with Myosure resection of endometrial polyp.  Proliferative endometrium. Recurrent endometrial polyps.   PLAN  Surgical findings, procedure and pathology report reviewed.  We discussed daily Provera versus Mirena IUD to treat proliferative endometrium.  Patient prefers to have a Mirena IUD.  Will precert.  Her first EMB will be 3 months post treatment and then potentially increase to every 6 months following this.    An After Visit Summary was printed and given to the  patient.

## 2022-04-13 ENCOUNTER — Ambulatory Visit (INDEPENDENT_AMBULATORY_CARE_PROVIDER_SITE_OTHER): Payer: BC Managed Care – PPO | Admitting: *Deleted

## 2022-04-13 ENCOUNTER — Telehealth: Payer: Self-pay | Admitting: Obstetrics and Gynecology

## 2022-04-13 DIAGNOSIS — R9389 Abnormal findings on diagnostic imaging of other specified body structures: Secondary | ICD-10-CM

## 2022-04-13 DIAGNOSIS — N950 Postmenopausal bleeding: Secondary | ICD-10-CM

## 2022-04-13 DIAGNOSIS — J309 Allergic rhinitis, unspecified: Secondary | ICD-10-CM | POA: Diagnosis not present

## 2022-04-13 NOTE — Telephone Encounter (Signed)
Please precert and schedule Mirena IUD insertion.   Patient has recurrent postmenopausal bleeding and endometrial proliferation.

## 2022-04-19 DIAGNOSIS — M9901 Segmental and somatic dysfunction of cervical region: Secondary | ICD-10-CM | POA: Diagnosis not present

## 2022-04-19 DIAGNOSIS — M9903 Segmental and somatic dysfunction of lumbar region: Secondary | ICD-10-CM | POA: Diagnosis not present

## 2022-04-19 DIAGNOSIS — M9902 Segmental and somatic dysfunction of thoracic region: Secondary | ICD-10-CM | POA: Diagnosis not present

## 2022-04-19 DIAGNOSIS — M9905 Segmental and somatic dysfunction of pelvic region: Secondary | ICD-10-CM | POA: Diagnosis not present

## 2022-04-22 ENCOUNTER — Ambulatory Visit (INDEPENDENT_AMBULATORY_CARE_PROVIDER_SITE_OTHER): Payer: BC Managed Care – PPO | Admitting: *Deleted

## 2022-04-22 DIAGNOSIS — J309 Allergic rhinitis, unspecified: Secondary | ICD-10-CM

## 2022-05-10 DIAGNOSIS — M9905 Segmental and somatic dysfunction of pelvic region: Secondary | ICD-10-CM | POA: Diagnosis not present

## 2022-05-10 DIAGNOSIS — M9901 Segmental and somatic dysfunction of cervical region: Secondary | ICD-10-CM | POA: Diagnosis not present

## 2022-05-10 DIAGNOSIS — M9902 Segmental and somatic dysfunction of thoracic region: Secondary | ICD-10-CM | POA: Diagnosis not present

## 2022-05-10 DIAGNOSIS — M9903 Segmental and somatic dysfunction of lumbar region: Secondary | ICD-10-CM | POA: Diagnosis not present

## 2022-05-18 ENCOUNTER — Ambulatory Visit (INDEPENDENT_AMBULATORY_CARE_PROVIDER_SITE_OTHER): Payer: BC Managed Care – PPO

## 2022-05-18 DIAGNOSIS — J309 Allergic rhinitis, unspecified: Secondary | ICD-10-CM | POA: Diagnosis not present

## 2022-05-19 NOTE — Patient Instructions (Incomplete)
Asthma Continue albuterol once every 4 hours as needed for cough or wheeze You may use albuterol 2 puffs 5 to 15 minutes before activity to decrease cough or wheeze Continue Arnuity 100 mcg 1 puff once a day to help prevent cough and wheeze  Allergic rhinitis  skin testing on 05/18/21 was positive to: grass pollens, weed pollens, tree pollens, molds, dust mites, cats, dogs, horse, and cockroach. Continue allergen avoidance measures directed toward grass pollen, weed pollen, tree pollen, mold, dust mite, cockroach, and pets as listed below She wishes to stop allergy injections.  Form signed May useover-the-counter Astelin (azelastine) nasal spray 1 to 2 sprays each nostril twice a day as needed for runny nose/drainage down the throat.  May use an antihistamine such as Claritin, Zyrtec, Allegra, or Xyzal once a day as needed  Food allergy Continue to avoid fish, shellfish, and mammal meat.  In case of an allergic reaction, take Benadryl 50 mg capsules every 6 hours, and if life-threatening symptoms occur, inject with EpiPen 0.3 mg.  Heartburn  Start omeprazole 20 mg once a day Dietary and lifestyle modifications.  Form given  Call the clinic if this treatment plan is not working well for you  Follow up in 4-6 months or sooner if needed.  Reducing Pollen Exposure The American Academy of Allergy, Asthma and Immunology suggests the following steps to reduce your exposure to pollen during allergy seasons. Do not hang sheets or clothing out to dry; pollen may collect on these items. Do not mow lawns or spend time around freshly cut grass; mowing stirs up pollen. Keep windows closed at night.  Keep car windows closed while driving. Minimize morning activities outdoors, a time when pollen counts are usually at their highest. Stay indoors as much as possible when pollen counts or humidity is high and on windy days when pollen tends to remain in the air longer. Use air conditioning when possible.   Many air conditioners have filters that trap the pollen spores. Use a HEPA room air filter to remove pollen form the indoor air you breathe.  Control of Mold Allergen Mold and fungi can grow on a variety of surfaces provided certain temperature and moisture conditions exist.  Outdoor molds grow on plants, decaying vegetation and soil.  The major outdoor mold, Alternaria and Cladosporium, are found in very high numbers during hot and dry conditions.  Generally, a late Summer - Fall peak is seen for common outdoor fungal spores.  Rain will temporarily lower outdoor mold spore count, but counts rise rapidly when the rainy period ends.  The most important indoor molds are Aspergillus and Penicillium.  Dark, humid and poorly ventilated basements are ideal sites for mold growth.  The next most common sites of mold growth are the bathroom and the kitchen.  Outdoor [employer] Use air conditioning and keep windows closed Avoid exposure to decaying vegetation. Avoid leaf raking. Avoid grain handling. Consider wearing a face mask if working in moldy areas.  Indoor Mold Control Maintain humidity below 50%. Clean washable surfaces with 5% bleach solution. Remove sources e.g. Contaminated carpets.   Control of Dust Mite Allergen Dust mites play a major role in allergic asthma and rhinitis. They occur in environments with high humidity wherever human skin is found. Dust mites absorb humidity from the atmosphere (ie, they do not drink) and feed on organic matter (including shed human and animal skin). Dust mites are a microscopic type of insect that you cannot see with the naked eye. High  levels of dust mites have been detected from mattresses, pillows, carpets, upholstered furniture, bed covers, clothes, soft toys and any woven material. The principal allergen of the dust mite is found in its feces. A gram of dust may contain 1,000 mites and 250,000 fecal particles. Mite antigen is easily measured in the air  during house cleaning activities. Dust mites do not bite and do not cause harm to humans, other than by triggering allergies/asthma.  Ways to decrease your exposure to dust mites in your home:  1. Encase mattresses, box springs and pillows with a mite-impermeable barrier or cover  2. Wash sheets, blankets and drapes weekly in hot water (130 F) with detergent and dry them in a dryer on the hot setting.  3. Have the room cleaned frequently with a vacuum cleaner and a damp dust-mop. For carpeting or rugs, vacuuming with a vacuum cleaner equipped with a high-efficiency particulate air (HEPA) filter. The dust mite allergic individual should not be in a room which is being cleaned and should wait 1 hour after cleaning before going into the room.  4. Do not sleep on upholstered furniture (eg, couches).  5. If possible removing carpeting, upholstered furniture and drapery from the home is ideal. Horizontal blinds should be eliminated in the rooms where the person spends the most time (bedroom, study, television room). Washable vinyl, roller-type shades are optimal.  6. Remove all non-washable stuffed toys from the bedroom. Wash stuffed toys weekly like sheets and blankets above.  7. Reduce indoor humidity to less than 50%. Inexpensive humidity monitors can be purchased at most hardware stores. Do not use a humidifier as can make the problem worse and are not recommended.  Control of Cockroach Allergen Cockroach allergen has been identified as an important cause of acute attacks of asthma, especially in urban settings.  There are fifty-five species of cockroach that exist in the [place], however only three, the [place], [place] species produce allergen that can affect patients with Asthma.  Allergens can be obtained from fecal particles, egg casings and secretions from cockroaches.    Remove food sources. Reduce access to water. Seal access and entry points. Spray runways with  0.5-1% Diazinon or Chlorpyrifos Blow boric acid power under stoves and refrigerator. Place bait stations (hydramethylnon) at feeding sites.   Control of Dog or Cat Allergen Avoidance is the best way to manage a dog or cat allergy. If you have a dog or cat and are allergic to dog or cats, consider removing the dog or cat from the home. If you have a dog or cat but don't want to find it a new home, or if your family wants a pet even though someone in the household is allergic, here are some strategies that may help keep symptoms at bay:  Keep the pet out of your bedroom and restrict it to only a few rooms. Be advised that keeping the dog or cat in only one room will not limit the allergens to that room. Don't pet, hug or kiss the dog or cat; if you do, wash your hands with soap and water. High-efficiency particulate air (HEPA) cleaners run continuously in a bedroom or living room can reduce allergen levels over time. Regular use of a high-efficiency vacuum cleaner or a central vacuum can reduce allergen levels. Giving your dog or cat a bath at least once a week can reduce airborne allergen.

## 2022-05-20 ENCOUNTER — Other Ambulatory Visit: Payer: Self-pay

## 2022-05-20 ENCOUNTER — Encounter: Payer: Self-pay | Admitting: Family

## 2022-05-20 ENCOUNTER — Ambulatory Visit (INDEPENDENT_AMBULATORY_CARE_PROVIDER_SITE_OTHER): Payer: BC Managed Care – PPO | Admitting: Family

## 2022-05-20 VITALS — BP 132/84 | HR 67 | Temp 98.7°F | Resp 18 | Ht 67.0 in | Wt 255.1 lb

## 2022-05-20 DIAGNOSIS — J3089 Other allergic rhinitis: Secondary | ICD-10-CM | POA: Diagnosis not present

## 2022-05-20 DIAGNOSIS — J4540 Moderate persistent asthma, uncomplicated: Secondary | ICD-10-CM

## 2022-05-20 DIAGNOSIS — R12 Heartburn: Secondary | ICD-10-CM

## 2022-05-20 DIAGNOSIS — T7809XD Anaphylactic reaction due to other food products, subsequent encounter: Secondary | ICD-10-CM | POA: Diagnosis not present

## 2022-05-20 MED ORDER — OMEPRAZOLE MAGNESIUM 20 MG PO TBEC: DELAYED_RELEASE_TABLET | ORAL | 5 refills | Status: DC

## 2022-05-20 NOTE — Progress Notes (Signed)
[place] [address] Dept: [phone]  FOLLOW UP NOTE  Patient ID: Monica Good, female    DOB: Jul 16, 1965  Age: 56 y.o. MRN: [id number] Date of Office Visit: 05/20/2022  Assessment  Chief Complaint: No chief complaint on file.  HPI [name] is a 56 year old female who presents today for follow-up of moderate persistent asthma, allergy with anaphylaxis due to food, and seasonal and perennial allergic rhinitis.  She was last seen on December 11, 2021 by myself.  She denies any new diagnoses or surgeries since her last office visit.  Moderate persistent asthma: She continues to take Arnuity 100 mcg 1 puff once a day and uses albuterol as needed.  She reports that she had to use her albuterol one time when she had tightness with bad heartburn.  She denies cough, wheeze, shortness of breath, nocturnal awakenings due to breathing problems.  Since her last office visit she has not required any systemic steroids or made any trips to the emergency room or urgent care due to breathing problems.  She has used her albuterol 2 times in the past month.  She reports in the past that her asthma would flare in the cold weather.  But she and her husband are interested in moving to a colder climate once they retire.  She wonders how her asthma will do. Her ACT score today is a 21  Allergic rhinitis: She continues to receive allergy injections every 4 weeks per protocol.  She has been on allergy injections for over 5 years.  She does not currently take any antihistamine and does not use any nasal sprays.  She denies rhinorrhea, nasal congestion, and postnasal drip.  She has not had any sinus infections since we last saw her.  She is interested in stopping allergy injections.  Food allergy: She continues to try to avoid fish, shellfish, and mammalian meat.  She does mention since her last office visit she accidentally took 1 bite of a sandwich with bacon.  She took Benadryl while in the  restaurant and approximately 6 to 7 hours later she felt something in her throat.  She did not take any additional medication or use her EpiPen.  She did rinse out her mouth.  Heartburn: She reports really bad heartburn.  When she took the omeprazole 20 mg that was prescribed she does mention that it did help.  She is interested in refills.  Drug Allergies:  Allergies  Allergen Reactions   Alpha-Gal Anaphylaxis   Beef-Derived Products Anaphylaxis   Fish Allergy Anaphylaxis   Lambs Quarters Anaphylaxis    Has not had but is warned by allergist that could have anaphylaxis.   Other Anaphylaxis    **SEAFOOD**   Pork-Derived Products Anaphylaxis   Shellfish Allergy Anaphylaxis   Bee Venom Swelling   Latex Hives   Peanut Oil Itching    Hands itch, just peanut ,  Tree nuts okay    Review of Systems: Review of Systems  Constitutional:  Negative for chills and fever.  HENT:         Denies rhinorrhea, nasal congestion, and postnasal drip  Eyes:        Denies itchy watery eyes  Respiratory:  Negative for cough, shortness of breath and wheezing.        Reports little bit of tightness in her chest when she had heartburn.  Denies cough, wheeze, shortness of breath, and nocturnal awakenings due to breathing problems  Cardiovascular:  Negative for  chest pain and palpitations.  Gastrointestinal:  Positive for heartburn.  Genitourinary:  Negative for frequency.  Skin:  Negative for itching and rash.  Neurological:  Negative for headaches.  Endo/Heme/Allergies:  Positive for environmental allergies.     Physical Exam: BP 132/84   Pulse 67   Temp 98.7 F (37.1 C)   Resp 18   Ht 5' 7"  (1.702 m)   Wt 255 lb 1.6 oz (115.7 kg)   LMP 06/14/2016 (Approximate)   SpO2 99%   BMI 39.95 kg/m    Physical Exam Constitutional:      Appearance: Normal appearance.  HENT:     Head: Normocephalic and atraumatic.     Comments: Pharynx normal, eyes normal, ears normal, nose normal    Right Ear:  Tympanic membrane, ear canal and external ear normal.     Left Ear: Tympanic membrane, ear canal and external ear normal.     Nose: Nose normal.     Mouth/Throat:     Mouth: Mucous membranes are moist.     Pharynx: Oropharynx is clear.  Eyes:     Conjunctiva/sclera: Conjunctivae normal.  Cardiovascular:     Rate and Rhythm: Normal rate and regular rhythm.     Heart sounds: Normal heart sounds.  Pulmonary:     Effort: Pulmonary effort is normal.     Breath sounds: Normal breath sounds.     Comments: Iungs clear to auscultation Musculoskeletal:     Cervical back: Neck supple.  Skin:    General: Skin is warm.  Neurological:     Mental Status: She is alert and oriented to person, place, and time.  Psychiatric:        Mood and Affect: Mood normal.        Behavior: Behavior normal.        Thought Content: Thought content normal.        Judgment: Judgment normal.     Diagnostics: FVC 2.92 L (82%), FEV1 2.38 L (85%).  Predicted FVC 3.57 L, predicted FEV1 2.80 L.  Spirometry indicates normal respiratory function.  Assessment and Plan: 1. Moderate persistent asthma without complication   2. Seasonal and perennial allergic rhinitis   3. Heartburn   4. Anaphylactic reaction due to other food products, subsequent encounter     No orders of the defined types were placed in this encounter.   Patient Instructions  Asthma Continue albuterol once every 4 hours as needed for cough or wheeze You may use albuterol 2 puffs 5 to 15 minutes before activity to decrease cough or wheeze Continue Arnuity 100 mcg 1 puff once a day to help prevent cough and wheeze  Allergic rhinitis  skin testing on 05/18/21 was positive to: grass pollens, weed pollens, tree pollens, molds, dust mites, cats, dogs, horse, and cockroach. Continue allergen avoidance measures directed toward grass pollen, weed pollen, tree pollen, mold, dust mite, cockroach, and pets as listed below She wishes to stop allergy  injections.  Form signed May useover-the-counter Astelin (azelastine) nasal spray 1 to 2 sprays each nostril twice a day as needed for runny nose/drainage down the throat.  May use an antihistamine such as Claritin, Zyrtec, Allegra, or Xyzal once a day as needed  Food allergy Continue to avoid fish, shellfish, and mammal meat.  In case of an allergic reaction, take Benadryl 50 mg capsules every 6 hours, and if life-threatening symptoms occur, inject with EpiPen 0.3 mg.  Heartburn  Start omeprazole 20 mg once a day Dietary and lifestyle modifications.  Form given  Call the clinic if this treatment plan is not working well for you  Follow up in 4-6 months or sooner if needed.  Reducing Pollen Exposure The American Academy of Allergy, Asthma and Immunology suggests the following steps to reduce your exposure to pollen during allergy seasons. Do not hang sheets or clothing out to dry; pollen may collect on these items. Do not mow lawns or spend time around freshly cut grass; mowing stirs up pollen. Keep windows closed at night.  Keep car windows closed while driving. Minimize morning activities outdoors, a time when pollen counts are usually at their highest. Stay indoors as much as possible when pollen counts or humidity is high and on windy days when pollen tends to remain in the air longer. Use air conditioning when possible.  Many air conditioners have filters that trap the pollen spores. Use a HEPA room air filter to remove pollen form the indoor air you breathe.  Control of Mold Allergen Mold and fungi can grow on a variety of surfaces provided certain temperature and moisture conditions exist.  Outdoor molds grow on plants, decaying vegetation and soil.  The major outdoor mold, Alternaria and Cladosporium, are found in very high numbers during hot and dry conditions.  Generally, a late Summer - Fall peak is seen for common outdoor fungal spores.  Rain will temporarily lower outdoor mold  spore count, but counts rise rapidly when the rainy period ends.  The most important indoor molds are Aspergillus and Penicillium.  Dark, humid and poorly ventilated basements are ideal sites for mold growth.  The next most common sites of mold growth are the bathroom and the kitchen.  Outdoor [employer] Use air conditioning and keep windows closed Avoid exposure to decaying vegetation. Avoid leaf raking. Avoid grain handling. Consider wearing a face mask if working in moldy areas.  Indoor Mold Control Maintain humidity below 50%. Clean washable surfaces with 5% bleach solution. Remove sources e.g. Contaminated carpets.   Control of Dust Mite Allergen Dust mites play a major role in allergic asthma and rhinitis. They occur in environments with high humidity wherever human skin is found. Dust mites absorb humidity from the atmosphere (ie, they do not drink) and feed on organic matter (including shed human and animal skin). Dust mites are a microscopic type of insect that you cannot see with the naked eye. High levels of dust mites have been detected from mattresses, pillows, carpets, upholstered furniture, bed covers, clothes, soft toys and any woven material. The principal allergen of the dust mite is found in its feces. A gram of dust may contain 1,000 mites and 250,000 fecal particles. Mite antigen is easily measured in the air during house cleaning activities. Dust mites do not bite and do not cause harm to humans, other than by triggering allergies/asthma.  Ways to decrease your exposure to dust mites in your home:  1. Encase mattresses, box springs and pillows with a mite-impermeable barrier or cover  2. Wash sheets, blankets and drapes weekly in hot water (130 F) with detergent and dry them in a dryer on the hot setting.  3. Have the room cleaned frequently with a vacuum cleaner and a damp dust-mop. For carpeting or rugs, vacuuming with a vacuum cleaner equipped with a  high-efficiency particulate air (HEPA) filter. The dust mite allergic individual should not be in a room which is being cleaned and should wait 1 hour after cleaning before going into the room.  4. Do not sleep  on upholstered furniture (eg, couches).  5. If possible removing carpeting, upholstered furniture and drapery from the home is ideal. Horizontal blinds should be eliminated in the rooms where the person spends the most time (bedroom, study, television room). Washable vinyl, roller-type shades are optimal.  6. Remove all non-washable stuffed toys from the bedroom. Wash stuffed toys weekly like sheets and blankets above.  7. Reduce indoor humidity to less than 50%. Inexpensive humidity monitors can be purchased at most hardware stores. Do not use a humidifier as can make the problem worse and are not recommended.  Control of Cockroach Allergen Cockroach allergen has been identified as an important cause of acute attacks of asthma, especially in urban settings.  There are fifty-five species of cockroach that exist in the [place], however only three, the [place], [place] species produce allergen that can affect patients with Asthma.  Allergens can be obtained from fecal particles, egg casings and secretions from cockroaches.    Remove food sources. Reduce access to water. Seal access and entry points. Spray runways with 0.5-1% Diazinon or Chlorpyrifos Blow boric acid power under stoves and refrigerator. Place bait stations (hydramethylnon) at feeding sites.   Control of Dog or Cat Allergen Avoidance is the best way to manage a dog or cat allergy. If you have a dog or cat and are allergic to dog or cats, consider removing the dog or cat from the home. If you have a dog or cat but don't want to find it a new home, or if your family wants a pet even though someone in the household is allergic, here are some strategies that may help keep symptoms at bay:  Keep the pet out  of your bedroom and restrict it to only a few rooms. Be advised that keeping the dog or cat in only one room will not limit the allergens to that room. Don't pet, hug or kiss the dog or cat; if you do, wash your hands with soap and water. High-efficiency particulate air (HEPA) cleaners run continuously in a bedroom or living room can reduce allergen levels over time. Regular use of a high-efficiency vacuum cleaner or a central vacuum can reduce allergen levels. Giving your dog or cat a bath at least once a week can reduce airborne allergen.  Return in about 4 months (around 09/19/2022), or if symptoms worsen or fail to improve.    Thank you for the opportunity to care for this patient.  Please do not hesitate to contact me with questions.  Althea Charon, FNP [facility]

## 2022-05-24 DIAGNOSIS — M79645 Pain in left finger(s): Secondary | ICD-10-CM | POA: Diagnosis not present

## 2022-05-24 DIAGNOSIS — R2232 Localized swelling, mass and lump, left upper limb: Secondary | ICD-10-CM | POA: Diagnosis not present

## 2022-05-24 DIAGNOSIS — M79644 Pain in right finger(s): Secondary | ICD-10-CM | POA: Diagnosis not present

## 2022-05-24 NOTE — Telephone Encounter (Signed)
Update on status of Mirena IUD?

## 2022-05-25 NOTE — Telephone Encounter (Signed)
Per review of EPIC, no IUD ordered.  Order placed for IUD insertion.   Routing to [place] for benefits and Butch Penny for scheduling.

## 2022-05-26 NOTE — Telephone Encounter (Signed)
Gorden Harms  You; [place], [place] hours ago (2:25 PM)   DM Appt is Jan 10 @1 :30 with Dr Quincy Simmonds.  Routing to Dr. Antony Blackbird.   Encounter closed.

## 2022-06-01 DIAGNOSIS — M9903 Segmental and somatic dysfunction of lumbar region: Secondary | ICD-10-CM | POA: Diagnosis not present

## 2022-06-01 DIAGNOSIS — M9902 Segmental and somatic dysfunction of thoracic region: Secondary | ICD-10-CM | POA: Diagnosis not present

## 2022-06-01 DIAGNOSIS — M9901 Segmental and somatic dysfunction of cervical region: Secondary | ICD-10-CM | POA: Diagnosis not present

## 2022-06-01 DIAGNOSIS — M9905 Segmental and somatic dysfunction of pelvic region: Secondary | ICD-10-CM | POA: Diagnosis not present

## 2022-06-10 NOTE — Progress Notes (Signed)
GYNECOLOGY  VISIT   HPI: 56 y.o.   Married  Caucasian  female   [phone] with Patient's last menstrual period was 06/14/2016 (approximate).   here for   Mirena Insertion  Had dilation and curettage for postmenopausal bleeding.  Pathology showed:  benign endometrial polyp and proliferative endometrium.   GYNECOLOGIC HISTORY: Patient's last menstrual period was 06/14/2016 (approximate). Contraception:  post menopausal Menopausal hormone therapy:  n/a Last mammogram:  09/02/21 Breast Composition Category B, BI-RADS CATEGORY 1 Negative Last pap smear:   12/30/21 ASCUS: HR HPV negative        OB History     Gravida  4   Para  4   Term  4   Preterm      AB      Living  4      SAB      IAB      Ectopic      Multiple      Live Births  4              Patient Active Problem List   Diagnosis Date Noted   Mild intermittent asthma without complication 80/32/1224   Coughing 11/24/2017   Globus sensation 11/24/2017   Laryngopharyngeal reflux (LPR) 11/24/2017   Allergic reaction 03/02/2017   Contact dermatitis due to chemicals 09/22/2016   Seasonal and perennial allergic rhinitis 09/01/2015   Mild persistent asthma 09/01/2015   Food Allergy 09/01/2015   Cystocele 11/02/2013    Past Medical History:  Diagnosis Date   Arthritis    Eczema    Endometrial mass    Environmental allergies    GERD (gastroesophageal reflux disease)    prn tums and watches diet   Hashimoto's disease    followed by dr Modena Nunnery,  takes low dose naltrexone   History of DVT of lower extremity 1995   left thigh,  completed one year blood thinner   Hypertension    followed by pcp   Moderate persistent asthma without complication    followed [facility] dr Mamie Nick. padgett [facility]   PMB (postmenopausal bleeding)    Seasonal and perennial allergic rhinitis    Type 2 diabetes mellitus ([facility])    followed by pcp   Wears glasses     Past Surgical History:  Procedure Laterality Date    DILATATION & CURETTAGE/HYSTEROSCOPY WITH MYOSURE N/A 10/20/2020   Procedure: DILATATION & CURETTAGE/HYSTEROSCOPY WITH MYOSURE polypectomy;  Surgeon: Nunzio Cobbs, MD;  Location: [facility];  Service: Gynecology;  Laterality: N/A;   DILATATION & CURETTAGE/HYSTEROSCOPY WITH MYOSURE N/A 03/29/2022   Procedure: DILATATION & CURETTAGE/HYSTEROSCOPY WITH MYOSURE;  Surgeon: Nunzio Cobbs, MD;  Location: [facility];  Service: Gynecology;  Laterality: N/A;   HAND SURGERY Right 2006  @[facility]    abscess   KNEE ARTHROSCOPY Left 04-27-2010   @[facility]    LAPAROSCOPY  1991   diagnostic    SHOULDER ARTHROSCOPY Right 2004    Current Outpatient Medications  Medication Sig Dispense Refill   Acetylcysteine (NAC PO) Take 900 mg by mouth 2 (two) times daily.     albuterol (PROVENTIL HFA) 108 (90 Base) MCG/ACT inhaler Inhale 2 puffs into the lungs every 4 (four) hours as needed. (Patient taking differently: Inhale 2 puffs into the lungs every 4 (four) hours as needed for shortness of breath or wheezing.) 18 g 1   ARNUITY ELLIPTA 100 MCG/ACT AEPB INHALE 1 PUFF INTO THE LUNGS DAILY 90 each 1  buPROPion (WELLBUTRIN XL) 300 MG 24 hr tablet Take 300 mg by mouth at bedtime.     calcium carbonate (TUMS - DOSED IN MG ELEMENTAL CALCIUM) 500 MG chewable tablet Chew 1 tablet by mouth as needed for indigestion or heartburn.     EPINEPHrine 0.3 mg/0.3 mL IJ SOAJ injection Inject 0.3 mg into the muscle as needed. 2 each 1   losartan (COZAAR) 50 MG tablet Take 50 mg by mouth 2 (two) times daily.  5   MAGNESIUM PO Take 120 mg by mouth 2 (two) times daily.     Melatonin 5 MG CAPS Take 1 capsule by mouth at bedtime as needed.     metFORMIN (GLUCOPHAGE) 1000 MG tablet      Misc Natural Products (T-RELIEF CBD+13 SL) Place 600 mg under the tongue daily. Hemp/ cbd one under tongue daily     Multiple Vitamin (MULTIVITAMIN) capsule Take 1 capsule by mouth daily.     NALTREXONE HCL PO  Take 4.5 mg by mouth 2 (two) times daily.     omeprazole (PRILOSEC OTC) 20 MG tablet Take 1 tablet daily 30 tablet 5   OZEMPIC, 1 MG/DOSE, 4 MG/3ML SOPN Inject 1 mg into the skin once a week. Sunday's     PRESCRIPTION MEDICATION every 30 (thirty) days. Allergy shots     Probiotic Product (PROBIOTIC DAILY) CAPS Take 1 capsule by mouth daily.     rivaroxaban (XARELTO) 10 MG TABS tablet Take 1 tablet (10 mg total) by mouth daily. Take 1 tablet by mouth on Friday and on Saturday prior to surgery.  Do not take the day prior to surgery and the day of surgery.  You may resume one tablet by mouth daily for 10 days, starting the day after surgery. 12 tablet 0   Vitamin A 3 MG (10000 UT) TABS Take 1 capsule by mouth 2 (two) times a week.     No current facility-administered medications for this visit.     ALLERGIES: Alpha-gal, Beef-derived products, Fish allergy, Lambs quarters, Other, Pork-derived products, Shellfish allergy, Bee venom, Latex, and Peanut oil  Family History  Problem Relation Age of Onset   Arthritis Mother    Hypertension Mother    Diabetes Mother    Allergic rhinitis Neg Hx    Angioedema Neg Hx    Eczema Neg Hx    Asthma Neg Hx    Immunodeficiency Neg Hx    Urticaria Neg Hx     Social History   Socioeconomic History   Marital status: Married    Spouse name: Not on file   Number of children: Not on file   Years of education: Not on file   Highest education level: Not on file  Occupational History   Not on file  Tobacco Use   Smoking status: Never   Smokeless tobacco: Never  Vaping Use   Vaping Use: Never used  Substance and Sexual Activity   Alcohol use: Yes    Comment: very rare   Drug use: Never   Sexual activity: Not Currently    Partners: Male    Birth control/protection: Post-menopausal  Other Topics Concern   Not on file  Social History Narrative   Not on file   Social Determinants of Health   Financial Resource Strain: Not on file  Food  Insecurity: Not on file  Transportation Needs: Not on file  Physical Activity: Not on file  Stress: Not on file  Social Connections: Not on file  Intimate Partner Violence: Not  on file    Review of Systems  All other systems reviewed and are negative.   PHYSICAL EXAMINATION:    BP 128/84 (BP Location: Left Arm, Patient Position: Sitting, Cuff Size: Large)   Ht 5' 7"  (1.702 m)   Wt 254 lb (115.2 kg)   LMP 06/14/2016 (Approximate)   BMI 39.78 kg/m     General appearance: alert, cooperative and appears stated age   Pelvic: External genitalia:  no lesions              Urethra:  normal appearing urethra with no masses, tenderness or lesions              Bartholins and Skenes: normal                 Vagina: normal appearing vagina with normal color and discharge, no lesions              Cervix: no lesions                Bimanual Exam:  Uterus:  normal size, contour, position, consistency, mobility, non-tender              Adnexa: no mass, fullness, tenderness        Mirena IUD insertion - lot [id number], exp Oct 2025 Consent for procedure.  Sterile prep with Hibiclens.  Paracervical block 10 cc 1% lidocaine, lot [id number], exp 07/16/23 Os finder used.  Uterus to sounded to just over 8 cm.  IUD placed without difficulty.  Strings trimmed.  Repeat bimanual exam, no change.  Minimal EBL.  No complications.   Chaperone was present for exam:  [place] IUD placement.  Status post hysteroscopy with Myosure resection of endometrial polyp.  Proliferative endometrium.  PLAN  IUD precautions to patient.  IUD card to patient.  Fu for 1 month IUD check up.  Endometrial biopsy in 3 months.    An After Visit Summary was printed and given to the patient.

## 2022-06-15 ENCOUNTER — Other Ambulatory Visit: Payer: Self-pay | Admitting: Family

## 2022-06-23 ENCOUNTER — Ambulatory Visit: Payer: BC Managed Care – PPO | Admitting: Obstetrics and Gynecology

## 2022-06-23 ENCOUNTER — Encounter: Payer: Self-pay | Admitting: Obstetrics and Gynecology

## 2022-06-23 DIAGNOSIS — N840 Polyp of corpus uteri: Secondary | ICD-10-CM

## 2022-06-23 DIAGNOSIS — N8500 Endometrial hyperplasia, unspecified: Secondary | ICD-10-CM | POA: Diagnosis not present

## 2022-06-23 DIAGNOSIS — R9389 Abnormal findings on diagnostic imaging of other specified body structures: Secondary | ICD-10-CM

## 2022-06-23 DIAGNOSIS — N950 Postmenopausal bleeding: Secondary | ICD-10-CM

## 2022-06-23 NOTE — Patient Instructions (Signed)
Intrauterine Device Insertion An intrauterine device (IUD) is a medical device that is inserted into the uterus to prevent pregnancy. It is a small, T-shaped device that has one or two nylon strings hanging down from it. The strings hang out of the lower part of the uterus (cervix) to allow for future IUD removal. There are two types of IUDs: Hormone IUD. This type of IUD is made of plastic and contains the hormone progestin (synthetic progesterone). A hormone IUD may last 3-5 years, depending on which one you have. Synthetic progesterone prevents pregnancy by: Thickening cervical mucus to prevent sperm from entering the uterus. Thinning the uterine lining to prevent a fertilized egg from implanting there. Copper IUD. This type of IUD has copper wire wrapped around it. A copper IUD may last up to 10 years. Copper prevents pregnancy by making the uterus and fallopian tubes produce a fluid that kills sperm. Tell a health care provider about: Any allergies you have. All medicines you are taking, including vitamins, herbs, eye drops, creams, and over-the-counter medicines. Any surgeries you have had. Any medical conditions you have, including any sexually transmitted infections (STIs) you may have. Whether you are pregnant or may be pregnant. What are the risks? Generally, this is a safe procedure. However, problems may occur, including: Infection. Bleeding. Allergic reactions to medicines. Puncture (perforation) of the uterus or damage to other structures or organs. Accidental placement of the IUD either in the muscle layer of the uterus (myometrium) or outside the uterus. The IUD falling out of the uterus (expulsion). This is more common among women who have recently had a child. Higher risk of an egg being fertilized outside your uterus (ectopic pregnancy).This is rare. Pelvic inflammatory disease (PID), which is an infection in the uterus and fallopian tubes. The IUD does not cause the  infection. The infection is usually from an unknown sexually transmitted infection (STI). This is rare, and it usually happens during the first 20 days after the IUD is inserted. What happens before the procedure? Ask your health care provider about: Changing or stopping your regular medicines. This is especially important if you are taking diabetes medicines or blood thinners. Taking over-the-counter medicines, vitamins, herbs, and supplements. Talk with your health care provider about when to schedule your IUD placement. Your health care provider may recommend taking over-the-counter pain medicines before the procedure. These medicines include ibuprofen and naproxen. You may have tests for: Pregnancy. A pregnancy test involves having a urine or blood sample taken. Sexually transmitted infections (STIs). Placing an IUD in someone who has an STI can make the infection worse. Cervical cancer. You may have a Pap test to check for this type of cancer. This means collecting cells from your cervix to be checked under a microscope. You may have a physical exam to determine the size and position of your uterus. What happens during the procedure? A tool (speculum) will be placed in your vagina and widened so that your health care provider can see your cervix. Medicine, or antiseptic, may be applied to your cervix to help lower your risk of infection. You may be given an anesthetic medicine to numb each side of your cervix. This medicine is usually given by an injection into the cervix. A tool called a uterine sound will be inserted into your uterus to check the length of your uterus and the direction that your uterus may be tilted. A slim instrument or tube (IUD inserter) that holds the IUD will be inserted into your vagina,  through your cervical canal, and into your uterus. The IUD will be placed in the uterus, and the IUD inserter will be removed. The strings that are attached to the IUD will be trimmed  so that they lie just below the cervix. The speculum will be removed. The procedure may vary among health care providers and hospitals. What can I expect after procedure? You may have bleeding after the procedure. This is normal. It varies from light bleeding (spotting) for a few days to menstrual-like bleeding. You may have cramping and pain in the abdomen. You may feel dizzy or light-headed. You may have lower back pain. You may have headaches and nausea. Follow these instructions at home: Before resuming sexual activity, check to make sure that you can feel the IUD string or strings. You should be able to feel the end of the string below the opening of your cervix. If your IUD string is in place, you may resume sexual activity. If you had a hormonal IUD inserted more than 7 days after your most recent period started, you will need to use a backup method of birth control for 7 days after IUD insertion. Ask your health care provider whether this applies to you. Continue to check that the IUD is still in place by feeling for the strings after every menstrual period, or once a month. An IUD will not protect you from sexually transmitted infections (STIs). Use methods to prevent the exchange of body fluids between partners (barrier protection) every time you have sex. Barrier protection can be used during oral, vaginal, or anal sex. Commonly used barrier methods include: Female condom. Female condom. Dental dam. Take over-the-counter and prescription medicines only as told by your health care provider. Keep all follow-up visits. This is important. Contact a health care provider if: You feel light-headed or weak. You have any of the following problems with your IUD string or strings: The string bothers or hurts you or your sexual partner. You cannot feel the string. The string has gotten longer. You can feel the IUD in your vagina. You think you may be pregnant, or you miss your menstrual  period. You think you may have a sexually transmitted infection (STI). Get help right away if you: You have flu-like symptoms, such as tiredness (fatigue) and muscle aches. You have a fever and chills. You have bleeding that is heavier or lasts longer than a normal menstrual cycle. You have abnormal or bad-smelling discharge from your vagina. You develop abdominal pain that is new, is getting worse, or is not in the same area of earlier cramping and pain. You have pain during sexual activity. Summary An intrauterine device (IUD) is a small, T-shaped device that has one or two nylon strings hanging down from it. You may have a copper IUD or a hormone IUD. Ask your health care provider what you need to do before the procedure. You may have some tests and you may have to change or stop some medicines. You may have bleeding after the procedure. This is normal. It varies from light spotting for a few days to menstrual-like bleeding. Check to make sure that you can feel the IUD strings before you resume sexual activity. Check the strings after every menstrual period or once a month. An IUD does not protect against STIs. Use other methods to protect yourself against infections. This information is not intended to replace advice given to you by your health care provider. Make sure you discuss any questions you have with  your health care provider. Document Revised: 12/12/2019 Document Reviewed: 12/12/2019 Elsevier Patient Education  [place].

## 2022-06-29 DIAGNOSIS — M9905 Segmental and somatic dysfunction of pelvic region: Secondary | ICD-10-CM | POA: Diagnosis not present

## 2022-06-29 DIAGNOSIS — M9903 Segmental and somatic dysfunction of lumbar region: Secondary | ICD-10-CM | POA: Diagnosis not present

## 2022-06-29 DIAGNOSIS — M9901 Segmental and somatic dysfunction of cervical region: Secondary | ICD-10-CM | POA: Diagnosis not present

## 2022-06-29 DIAGNOSIS — M9902 Segmental and somatic dysfunction of thoracic region: Secondary | ICD-10-CM | POA: Diagnosis not present

## 2022-07-08 NOTE — Progress Notes (Signed)
GYNECOLOGY  VISIT   HPI: 57 y.o.   Married  Caucasian  female   [phone] with Patient's last menstrual period was 06/14/2016 (approximate).   here for   4 week IUD check.  Has Mirena IUD placed due to postmenopausal bleeding and proliferative endometrium.   Bled lightly for 3 weeks and then heavy for 3 weeks.  Now light bleeding again.   Light cramping after the IUD was placed.   Has not checked the IUD.   Decreased desire.   GYNECOLOGIC HISTORY: Patient's last menstrual period was 06/14/2016 (approximate). Contraception:  PMP/ Mirena IUD--06/23/22 Menopausal hormone therapy:  n/a Last mammogram:  09/02/21 Breast Composition Category B, BI-RADS CATEGORY 1 Negative  Last pap smear:   12/30/21 ASCUS: HR HPV negative         OB History     Gravida  4   Para  4   Term  4   Preterm      AB      Living  4      SAB      IAB      Ectopic      Multiple      Live Births  4              Patient Active Problem List   Diagnosis Date Noted   Mild intermittent asthma without complication 06/22/3233   Coughing 11/24/2017   Globus sensation 11/24/2017   Laryngopharyngeal reflux (LPR) 11/24/2017   Allergic reaction 03/02/2017   Contact dermatitis due to chemicals 09/22/2016   Seasonal and perennial allergic rhinitis 09/01/2015   Mild persistent asthma 09/01/2015   Food Allergy 09/01/2015   Cystocele 11/02/2013    Past Medical History:  Diagnosis Date   Arthritis    Eczema    Endometrial mass    Environmental allergies    GERD (gastroesophageal reflux disease)    prn tums and watches diet   Hashimoto's disease    followed by dr Modena Nunnery,  takes low dose naltrexone   History of DVT of lower extremity 1995   left thigh,  completed one year blood thinner   Hypertension    followed by pcp   Moderate persistent asthma without complication    followed by dr [facility]ie Nick. [facility]gett [facility]   PMB (postmenopausal bleeding)    Seasonal and perennial  allergic rhinitis    Type 2 diabetes mellitus ([place])    followed by pcp   Wears glasses     Past Surgical History:  Procedure Laterality Date   DILATATION & CURETTAGE/HYSTEROSCOPY WITH MYOSURE N/A 10/20/2020   Procedure: DILATATION & CURETTAGE/HYSTEROSCOPY WITH MYOSURE polypectomy;  Surgeon: Nunzio Cobbs, MD;  Location: [facility];  Service: Gynecology;  Laterality: N/A;   DILATATION & CURETTAGE/HYSTEROSCOPY WITH MYOSURE N/A 03/29/2022   Procedure: DILATATION & CURETTAGE/HYSTEROSCOPY WITH MYOSURE;  Surgeon: Nunzio Cobbs, MD;  Location: [facility];  Service: Gynecology;  Laterality: N/A;   HAND SURGERY Right 2006  '@[facility]'$    abscess   KNEE ARTHROSCOPY Left 04-27-2010   '@[facility]'$    LAPAROSCOPY  1991   diagnostic    SHOULDER ARTHROSCOPY Right 2004    Current Outpatient Medications  Medication Sig Dispense Refill   Acetylcysteine (NAC PO) Take 900 mg by mouth 2 (two) times daily.     albuterol (PROVENTIL HFA) 108 (90 Base) MCG/ACT inhaler Inhale 2 puffs into the lungs every 4 (four) hours as needed. (Patient taking differently: Inhale 2  puffs into the lungs every 4 (four) hours as needed for shortness of breath or wheezing.) 18 g 1   ARNUITY ELLIPTA 100 MCG/ACT AEPB INHALE 1 PUFF INTO THE LUNGS DAILY 90 each 1   buPROPion (WELLBUTRIN XL) 300 MG 24 hr tablet Take 300 mg by mouth at bedtime.     calcium carbonate (TUMS - DOSED IN MG ELEMENTAL CALCIUM) 500 MG chewable tablet Chew 1 tablet by mouth as needed for indigestion or heartburn.     EPINEPHrine 0.3 mg/0.3 mL IJ SOAJ injection Inject 0.3 mg into the muscle as needed. 2 each 1   losartan (COZAAR) 50 MG tablet Take 50 mg by mouth 2 (two) times daily.  5   MAGNESIUM PO Take 120 mg by mouth 2 (two) times daily.     Melatonin 5 MG CAPS Take 1 capsule by mouth at bedtime as needed.     metFORMIN (GLUCOPHAGE) 1000 MG tablet      Misc Natural Products (T-RELIEF CBD+13 SL) Place 600 mg under the  tongue daily. Hemp/ cbd one under tongue daily     Multiple Vitamin (MULTIVITAMIN) capsule Take 1 capsule by mouth daily.     NALTREXONE HCL PO Take 4.5 mg by mouth 2 (two) times daily.     omeprazole (PRILOSEC OTC) 20 MG tablet Take 1 tablet daily 30 tablet 5   OZEMPIC, 1 MG/DOSE, 4 MG/3ML SOPN Inject 1 mg into the skin once a week. Sunday's     PRESCRIPTION MEDICATION every 30 (thirty) days. Allergy shots     Probiotic Product (PROBIOTIC DAILY) CAPS Take 1 capsule by mouth daily.     rivaroxaban (XARELTO) 10 MG TABS tablet Take 1 tablet (10 mg total) by mouth daily. Take 1 tablet by mouth on Friday and on Saturday prior to surgery.  Do not take the day prior to surgery and the day of surgery.  You may resume one tablet by mouth daily for 10 days, starting the day after surgery. 12 tablet 0   Vitamin A 3 MG (10000 UT) TABS Take 1 capsule by mouth 2 (two) times a week.     No current facility-administered medications for this visit.     ALLERGIES: Alpha-gal, Beef-derived products, Fish allergy, Lambs quarters, Other, Pork-derived products, Shellfish allergy, Bee venom, Latex, and Peanut oil  Family History  Problem Relation Age of Onset   Arthritis Mother    Hypertension Mother    Diabetes Mother    Allergic rhinitis Neg Hx    Angioedema Neg Hx    Eczema Neg Hx    Asthma Neg Hx    Immunodeficiency Neg Hx    Urticaria Neg Hx     Social History   Socioeconomic History   Marital status: Married    Spouse name: Not on file   Number of children: Not on file   Years of education: Not on file   Highest education level: Not on file  Occupational History   Not on file  Tobacco Use   Smoking status: Never   Smokeless tobacco: Never  Vaping Use   Vaping Use: Never used  Substance and Sexual Activity   Alcohol use: Yes    Comment: very rare   Drug use: Never   Sexual activity: Not Currently    Partners: Male    Birth control/protection: Post-menopausal  Other Topics Concern    Not on file  Social History Narrative   Not on file   Social Determinants of Health   Financial Resource  Strain: Not on file  Food Insecurity: Not on file  Transportation Needs: Not on file  Physical Activity: Not on file  Stress: Not on file  Social Connections: Not on file  Intimate Partner Violence: Not on file    Review of Systems  All other systems reviewed and are negative.   PHYSICAL EXAMINATION:    BP 126/80 (BP Location: Left Arm, Patient Position: Sitting, Cuff Size: Large)   Pulse 70   Ht '5\' 7"'$  (1.702 m)   Wt 253 lb (114.8 kg)   LMP 06/14/2016 (Approximate)   SpO2 99%   BMI 39.63 kg/m     General appearance: alert, cooperative and appears stated age   Pelvic: External genitalia:  no lesions              Urethra:  normal appearing urethra with no masses, tenderness or lesions              Bartholins and Skenes: normal                 Vagina: normal appearing vagina with normal color and discharge, no lesions              Cervix: no lesions.  IUD strings trimmed to 2 cm of length.  Dark menstrual flow noted.                Bimanual Exam:  Uterus:  normal size, contour, position, consistency, mobility, non-tender              Adnexa: no mass, fullness, tenderness             Chaperone was present for exam:  [place] IUD check up.  Benign proliferative endometrium in postmenopausal female.   PLAN  Continue Mirena IUD.  Vaginal bleeding is expected. EMB in April 2024.    An After Visit Summary was printed and given to the patient.

## 2022-07-21 ENCOUNTER — Encounter: Payer: Self-pay | Admitting: Obstetrics and Gynecology

## 2022-07-21 ENCOUNTER — Ambulatory Visit: Payer: BC Managed Care – PPO | Admitting: Obstetrics and Gynecology

## 2022-07-21 VITALS — BP 126/80 | HR 70 | Ht 67.0 in | Wt 253.0 lb

## 2022-07-21 DIAGNOSIS — Z30431 Encounter for routine checking of intrauterine contraceptive device: Secondary | ICD-10-CM

## 2022-07-28 DIAGNOSIS — M9902 Segmental and somatic dysfunction of thoracic region: Secondary | ICD-10-CM | POA: Diagnosis not present

## 2022-07-28 DIAGNOSIS — M9903 Segmental and somatic dysfunction of lumbar region: Secondary | ICD-10-CM | POA: Diagnosis not present

## 2022-07-28 DIAGNOSIS — M9901 Segmental and somatic dysfunction of cervical region: Secondary | ICD-10-CM | POA: Diagnosis not present

## 2022-07-28 DIAGNOSIS — M9905 Segmental and somatic dysfunction of pelvic region: Secondary | ICD-10-CM | POA: Diagnosis not present

## 2022-08-03 DIAGNOSIS — E039 Hypothyroidism, unspecified: Secondary | ICD-10-CM | POA: Diagnosis not present

## 2022-08-03 DIAGNOSIS — R5383 Other fatigue: Secondary | ICD-10-CM | POA: Diagnosis not present

## 2022-08-03 DIAGNOSIS — D6489 Other specified anemias: Secondary | ICD-10-CM | POA: Diagnosis not present

## 2022-08-03 DIAGNOSIS — R7301 Impaired fasting glucose: Secondary | ICD-10-CM | POA: Diagnosis not present

## 2022-08-03 DIAGNOSIS — E063 Autoimmune thyroiditis: Secondary | ICD-10-CM | POA: Diagnosis not present

## 2022-08-03 DIAGNOSIS — R799 Abnormal finding of blood chemistry, unspecified: Secondary | ICD-10-CM | POA: Diagnosis not present

## 2022-08-03 DIAGNOSIS — E559 Vitamin D deficiency, unspecified: Secondary | ICD-10-CM | POA: Diagnosis not present

## 2022-08-03 DIAGNOSIS — E782 Mixed hyperlipidemia: Secondary | ICD-10-CM | POA: Diagnosis not present

## 2022-09-01 DIAGNOSIS — M9905 Segmental and somatic dysfunction of pelvic region: Secondary | ICD-10-CM | POA: Diagnosis not present

## 2022-09-01 DIAGNOSIS — M9901 Segmental and somatic dysfunction of cervical region: Secondary | ICD-10-CM | POA: Diagnosis not present

## 2022-09-01 DIAGNOSIS — M9903 Segmental and somatic dysfunction of lumbar region: Secondary | ICD-10-CM | POA: Diagnosis not present

## 2022-09-01 DIAGNOSIS — M9902 Segmental and somatic dysfunction of thoracic region: Secondary | ICD-10-CM | POA: Diagnosis not present

## 2022-09-02 DIAGNOSIS — Z1231 Encounter for screening mammogram for malignant neoplasm of breast: Secondary | ICD-10-CM | POA: Diagnosis not present

## 2022-09-03 ENCOUNTER — Encounter: Payer: Self-pay | Admitting: Obstetrics and Gynecology

## 2022-09-08 DIAGNOSIS — M9901 Segmental and somatic dysfunction of cervical region: Secondary | ICD-10-CM | POA: Diagnosis not present

## 2022-09-08 DIAGNOSIS — M9905 Segmental and somatic dysfunction of pelvic region: Secondary | ICD-10-CM | POA: Diagnosis not present

## 2022-09-08 DIAGNOSIS — M9903 Segmental and somatic dysfunction of lumbar region: Secondary | ICD-10-CM | POA: Diagnosis not present

## 2022-09-08 DIAGNOSIS — M9902 Segmental and somatic dysfunction of thoracic region: Secondary | ICD-10-CM | POA: Diagnosis not present

## 2022-09-08 NOTE — Progress Notes (Deleted)
GYNECOLOGY  VISIT   HPI: 57 y.o.   Married  Caucasian  female   [phone] with Patient's last menstrual period was 06/14/2016 (approximate).   here for   endo bx  GYNECOLOGIC HISTORY: Patient's last menstrual period was 06/14/2016 (approximate). Contraception:  PMP/ Mirena IUD--06/23/22 Menopausal hormone therapy:  n/a Last mammogram:  09/02/22 Breast Density Category B, BI-RADS CAT 1 NEG Last pap smear:   12/30/21 ASCUS: HR HPV neg        OB History     Gravida  4   Para  4   Term  4   Preterm      AB      Living  4      SAB      IAB      Ectopic      Multiple      Live Births  4              Patient Active Problem List   Diagnosis Date Noted   Mild intermittent asthma without complication [id number]   Coughing 11/24/2017   Globus sensation 11/24/2017   Laryngopharyngeal reflux (LPR) 11/24/2017   Allergic reaction 03/02/2017   Contact dermatitis due to chemicals 09/22/2016   Seasonal and perennial allergic rhinitis 09/01/2015   Mild persistent asthma 09/01/2015   Food Allergy 09/01/2015   Cystocele 11/02/2013    Past Medical History:  Diagnosis Date   Arthritis    Eczema    Endometrial mass    Environmental allergies    GERD (gastroesophageal reflux disease)    prn tums and watches diet   Hashimoto's disease    followed by dr Modena Nunnery,  takes low dose naltrexone   History of DVT of lower extremity 1995   left thigh,  completed one year blood thinner   Hypertension    followed by pcp   Moderate persistent asthma without complication    followed by dr Mamie Nick. padgett ([facility]   PMB (postmenopausal bleeding)    Seasonal and perennial allergic rhinitis    Type 2 diabetes mellitus ([place])    followed by pcp   Wears glasses     Past Surgical History:  Procedure Laterality Date   DILATATION & CURETTAGE/HYSTEROSCOPY WITH MYOSURE N/A 10/20/2020   Procedure: DILATATION & CURETTAGE/HYSTEROSCOPY WITH MYOSURE polypectomy;  Surgeon:  Nunzio Cobbs, MD;  Location: [facility];  Service: Gynecology;  Laterality: N/A;   DILATATION & CURETTAGE/HYSTEROSCOPY WITH MYOSURE N/A 03/29/2022   Procedure: DILATATION & CURETTAGE/HYSTEROSCOPY WITH MYOSURE;  Surgeon: Nunzio Cobbs, MD;  [facility];  Service: Gynecology;  Laterality: N/A;   HAND SURGERY Right 2006  @[facility]    abscess   KNEE ARTHROSCOPY Left 04-27-2010   @[facility]    LAPAROSCOPY  1991   diagnostic    SHOULDER ARTHROSCOPY Right 2004    Current Outpatient Medications  Medication Sig Dispense Refill   Acetylcysteine (NAC PO) Take 900 mg by mouth 2 (two) times daily.     albuterol (PROVENTIL HFA) 108 (90 Base) MCG/ACT inhaler Inhale 2 puffs into the lungs every 4 (four) hours as needed. (Patient taking differently: Inhale 2 puffs into the lungs every 4 (four) hours as needed for shortness of breath or wheezing.) 18 g 1   ARNUITY ELLIPTA 100 MCG/ACT AEPB INHALE 1 PUFF INTO THE LUNGS DAILY 90 each 1   buPROPion (WELLBUTRIN XL) 300 MG 24 hr tablet Take 300 mg by mouth at bedtime.  calcium carbonate (TUMS - DOSED IN MG ELEMENTAL CALCIUM) 500 MG chewable tablet Chew 1 tablet by mouth as needed for indigestion or heartburn.     EPINEPHrine 0.3 mg/0.3 mL IJ SOAJ injection Inject 0.3 mg into the muscle as needed. 2 each 1   losartan (COZAAR) 50 MG tablet Take 50 mg by mouth 2 (two) times daily.  5   MAGNESIUM PO Take 120 mg by mouth 2 (two) times daily.     Melatonin 5 MG CAPS Take 1 capsule by mouth at bedtime as needed.     metFORMIN (GLUCOPHAGE) 1000 MG tablet      Misc Natural Products (T-RELIEF CBD+13 SL) Place 600 mg under the tongue daily. Hemp/ cbd one under tongue daily     Multiple Vitamin (MULTIVITAMIN) capsule Take 1 capsule by mouth daily.     NALTREXONE HCL PO Take 4.5 mg by mouth 2 (two) times daily.     omeprazole (PRILOSEC OTC) 20 MG tablet Take 1 tablet daily 30 tablet 5   OZEMPIC, 1 MG/DOSE, 4 MG/3ML  SOPN Inject 1 mg into the skin once a week. Sunday's     PRESCRIPTION MEDICATION every 30 (thirty) days. Allergy shots     Probiotic Product (PROBIOTIC DAILY) CAPS Take 1 capsule by mouth daily.     rivaroxaban (XARELTO) 10 MG TABS tablet Take 1 tablet (10 mg total) by mouth daily. Take 1 tablet by mouth on Friday and on Saturday prior to surgery.  Do not take the day prior to surgery and the day of surgery.  You may resume one tablet by mouth daily for 10 days, starting the day after surgery. 12 tablet 0   Vitamin A 3 MG (10000 UT) TABS Take 1 capsule by mouth 2 (two) times a week.     No current facility-administered medications for this visit.     ALLERGIES: Alpha-gal, Beef-derived products, Fish allergy, Lambs quarters, Other, Pork-derived products, Shellfish allergy, Bee venom, Latex, and Peanut oil  Family History  Problem Relation Age of Onset   Arthritis Mother    Hypertension Mother    Diabetes Mother    Allergic rhinitis Neg Hx    Angioedema Neg Hx    Eczema Neg Hx    Asthma Neg Hx    Immunodeficiency Neg Hx    Urticaria Neg Hx     Social History   Socioeconomic History   Marital status: Married    Spouse name: Not on file   Number of children: Not on file   Years of education: Not on file   Highest education level: Not on file  Occupational History   Not on file  Tobacco Use   Smoking status: Never   Smokeless tobacco: Never  Vaping Use   Vaping Use: Never used  Substance and Sexual Activity   Alcohol use: Yes    Comment: very rare   Drug use: Never   Sexual activity: Not Currently    Partners: Male    Birth control/protection: Post-menopausal  Other Topics Concern   Not on file  Social History Narrative   Not on file   Social Determinants of Health   Financial Resource Strain: Not on file  Food Insecurity: Not on file  Transportation Needs: Not on file  Physical Activity: Not on file  Stress: Not on file  Social Connections: Not on file  Intimate  Partner Violence: Not on file    Review of Systems  PHYSICAL EXAMINATION:    LMP 06/14/2016 (Approximate)  General appearance: alert, cooperative and appears stated age Head: Normocephalic, without obvious abnormality, atraumatic Neck: no adenopathy, supple, symmetrical, trachea midline and thyroid normal to inspection and palpation Lungs: clear to auscultation bilaterally Breasts: normal appearance, no masses or tenderness, No nipple retraction or dimpling, No nipple discharge or bleeding, No axillary or supraclavicular adenopathy Heart: regular rate and rhythm Abdomen: soft, non-tender, no masses,  no organomegaly Extremities: extremities normal, atraumatic, no cyanosis or edema Skin: Skin color, texture, turgor normal. No rashes or lesions Lymph nodes: Cervical, supraclavicular, and axillary nodes normal. No abnormal inguinal nodes palpated Neurologic: Grossly normal  Pelvic: External genitalia:  no lesions              Urethra:  normal appearing urethra with no masses, tenderness or lesions              Bartholins and Skenes: normal                 Vagina: normal appearing vagina with normal color and discharge, no lesions              Cervix: no lesions                Bimanual Exam:  Uterus:  normal size, contour, position, consistency, mobility, non-tender              Adnexa: no mass, fullness, tenderness              Rectal exam: {yes no:[id number]}.  Confirms.              Anus:  normal sphincter tone, no lesions  Chaperone was present for exam:  ***  ASSESSMENT     PLAN     An After Visit Summary was printed and given to the patient.  ______ minutes face to face time of which over 50% was spent in counseling.

## 2022-09-15 NOTE — Progress Notes (Signed)
GYNECOLOGY  VISIT   HPI: 57 y.o.   Married  Caucasian  female   [phone] with Patient's last menstrual period was 06/14/2016 (approximate).   here for endometrial biopsy.  She is followed for recurrent postmenopausal bleeding, endometrial polyps, and proliferative endometrium.   Mirena IUD was placed 06/23/22 following her last hysteroscopy procedure.   Bleeding stopped two week ago.  Spotting only today.   GYNECOLOGIC HISTORY: Patient's last menstrual period was 06/14/2016 (approximate). Contraception:  PMP Menopausal hormone therapy:  n/a Last mammogram:  09/02/22 Breast Density Category B, BI-RADS CAT 1 neg Last pap smear:   12/20/21 ASCUS HR HPV neg, 07/03/20 neg: HR HPV neg        OB History     Gravida  4   Para  4   Term  4   Preterm      AB      Living  4      SAB      IAB      Ectopic      Multiple      Live Births  4              Patient Active Problem List   Diagnosis Date Noted   Mild intermittent asthma without complication 05/18/2021   Coughing 11/24/2017   Globus sensation 11/24/2017   Laryngopharyngeal reflux (LPR) 11/24/2017   Allergic reaction 03/02/2017   Contact dermatitis due to chemicals 09/22/2016   Seasonal and perennial allergic rhinitis 09/01/2015   Mild persistent asthma 09/01/2015   Food Allergy 09/01/2015   Cystocele 11/02/2013    Past Medical History:  Diagnosis Date   Arthritis    Eczema    Endometrial mass    Environmental allergies    GERD (gastroesophageal reflux disease)    prn tums and watches diet   Hashimoto's disease    followed by dr Ferdinand Cava,  takes low dose naltrexone   History of DVT of lower extremity 1995   left thigh,  completed one year blood thinner   Hypertension    followed by pcp   Moderate persistent asthma without complication    followed by dr Demetrius [facility]   PMB (postmenopausal bleeding)    Seasonal and perennial allergic rhinitis    Type 2 diabetes mellitus     followed by pcp   Wears glasses     Past Surgical History:  Procedure Laterality Date   DILATATION & CURETTAGE/HYSTEROSCOPY WITH MYOSURE N/A 10/20/2020   Procedure: DILATATION & CURETTAGE/HYSTEROSCOPY WITH MYOSURE polypectomy;  Surgeon: Patton Salles, MD;  Location: [facility];  Service: Gynecology;  Laterality: N/A;   DILATATION & CURETTAGE/HYSTEROSCOPY WITH MYOSURE N/A 03/29/2022   Procedure: DILATATION & CURETTAGE/HYSTEROSCOPY WITH MYOSURE;  Surgeon: Patton Salles, MD;  Location: [facility];  Service: Gynecology;  Laterality: N/A;   HAND SURGERY Right 2006  @[facility]    abscess   KNEE ARTHROSCOPY Left 04-27-2010   @[facility]    LAPAROSCOPY  1991   diagnostic    SHOULDER ARTHROSCOPY Right 2004    Current Outpatient Medications  Medication Sig Dispense Refill   Acetylcysteine (NAC PO) Take 900 mg by mouth 2 (two) times daily.     albuterol (PROVENTIL HFA) 108 (90 Base) MCG/ACT inhaler Inhale 2 puffs into the lungs every 4 (four) hours as needed. (Patient taking differently: Inhale 2 puffs into the lungs every 4 (four) hours as needed for shortness of breath or wheezing.) 18 g  1   ARNUITY ELLIPTA 100 MCG/ACT AEPB INHALE 1 PUFF INTO THE LUNGS DAILY 90 each 1   buPROPion (WELLBUTRIN XL) 300 MG 24 hr tablet Take 300 mg by mouth at bedtime.     calcium carbonate (TUMS - DOSED IN MG ELEMENTAL CALCIUM) 500 MG chewable tablet Chew 1 tablet by mouth as needed for indigestion or heartburn.     EPINEPHrine 0.3 mg/0.3 mL IJ SOAJ injection Inject 0.3 mg into the muscle as needed. 2 each 1   losartan (COZAAR) 50 MG tablet Take 50 mg by mouth 2 (two) times daily.  5   MAGNESIUM PO Take 120 mg by mouth 2 (two) times daily.     Melatonin 5 MG CAPS Take 1 capsule by mouth at bedtime as needed.     metFORMIN (GLUCOPHAGE) 1000 MG tablet      Misc Natural Products (T-RELIEF CBD+13 SL) Place 600 mg under the tongue daily. Hemp/ cbd one under tongue daily      Multiple Vitamin (MULTIVITAMIN) capsule Take 1 capsule by mouth daily.     NALTREXONE HCL PO Take 4.5 mg by mouth 2 (two) times daily.     omeprazole (PRILOSEC OTC) 20 MG tablet Take 1 tablet daily 30 tablet 5   OZEMPIC, 1 MG/DOSE, 4 MG/3ML SOPN Inject 1 mg into the skin once a week. Sunday's     PRESCRIPTION MEDICATION every 30 (thirty) days. Allergy shots     Probiotic Product (PROBIOTIC DAILY) CAPS Take 1 capsule by mouth daily.     rivaroxaban (XARELTO) 10 MG TABS tablet Take 1 tablet (10 mg total) by mouth daily. Take 1 tablet by mouth on Friday and on Saturday prior to surgery.  Do not take the day prior to surgery and the day of surgery.  You may resume one tablet by mouth daily for 10 days, starting the day after surgery. 12 tablet 0   Vitamin A 3 MG (10000 UT) TABS Take 1 capsule by mouth 2 (two) times a week.     No current facility-administered medications for this visit.     ALLERGIES: Alpha-gal, Beef-derived products, Fish allergy, Lambs quarters, Other, Pork-derived products, Shellfish allergy, Bee venom, Latex, and Peanut oil  Family History  Problem Relation Age of Onset   Arthritis Mother    Hypertension Mother    Diabetes Mother    Allergic rhinitis Neg Hx    Angioedema Neg Hx    Eczema Neg Hx    Asthma Neg Hx    Immunodeficiency Neg Hx    Urticaria Neg Hx     Social History   Socioeconomic History   Marital status: Married    Spouse name: Not on file   Number of children: Not on file   Years of education: Not on file   Highest education level: Not on file  Occupational History   Not on file  Tobacco Use   Smoking status: Never   Smokeless tobacco: Never  Vaping Use   Vaping Use: Never used  Substance and Sexual Activity   Alcohol use: Yes    Comment: very rare   Drug use: Never   Sexual activity: Not Currently    Partners: Male    Birth control/protection: Post-menopausal  Other Topics Concern   Not on file  Social History Narrative   Not on file    Social Determinants of Health   Financial Resource Strain: Not on file  Food Insecurity: Not on file  Transportation Needs: Not on file  Physical  Activity: Not on file  Stress: Not on file  Social Connections: Not on file  Intimate Partner Violence: Not on file    Review of Systems  All other systems reviewed and are negative.   PHYSICAL EXAMINATION:    BP 112/80 (BP Location: Left Arm, Patient Position: Sitting, Cuff Size: Large)   Pulse 68   Ht 5\' 7"  (1.702 m)   Wt 249 lb (112.9 kg)   LMP 06/14/2016 (Approximate)   SpO2 96%   BMI 39.00 kg/m     General appearance: alert, cooperative and appears stated age   Pelvic: External genitalia:  no lesions              Urethra:  normal appearing urethra with no masses, tenderness or lesions              Bartholins and Skenes: normal                 Vagina: normal appearing vagina with normal color and discharge, no lesions              Cervix: no lesions.  Brown blood noted.   IUD strings noted.                Bimanual Exam:  Uterus:  normal size, contour, position, consistency, mobility, non-tender              Adnexa: no mass, fullness, tenderness         EMB: Consent for procedure. Sterile prep with Hibiclens.  Paracervical block with 1% lidocaine 6 cc, lot number   [id number], expiration 12/2024. Tenaculum to anterior cervical lip. Pipelle passed to    8   cm twice.   Tissue to pathology.  Minimal EBL. No complications.  IUD maintained.   Chaperone was present for exam:  Warren Lacy, CMA  ASSESSMENT  Postmenopausal bleeding.  Hx endometrial polyps. Mirena IUD. Proliferative endometrium.   PLAN  Fu EMB.  Post biopsy precautions given. Final plan to follow.

## 2022-09-20 DIAGNOSIS — M9905 Segmental and somatic dysfunction of pelvic region: Secondary | ICD-10-CM | POA: Diagnosis not present

## 2022-09-20 DIAGNOSIS — M9903 Segmental and somatic dysfunction of lumbar region: Secondary | ICD-10-CM | POA: Diagnosis not present

## 2022-09-20 DIAGNOSIS — M9902 Segmental and somatic dysfunction of thoracic region: Secondary | ICD-10-CM | POA: Diagnosis not present

## 2022-09-20 DIAGNOSIS — M9901 Segmental and somatic dysfunction of cervical region: Secondary | ICD-10-CM | POA: Diagnosis not present

## 2022-09-21 ENCOUNTER — Ambulatory Visit: Payer: BC Managed Care – PPO | Admitting: Obstetrics and Gynecology

## 2022-09-21 ENCOUNTER — Encounter: Payer: Self-pay | Admitting: Obstetrics and Gynecology

## 2022-09-21 ENCOUNTER — Other Ambulatory Visit (HOSPITAL_COMMUNITY)
Admission: RE | Admit: 2022-09-21 | Discharge: 2022-09-21 | Disposition: A | Discharge status: Home / Self Care | Payer: BC Managed Care – PPO | Source: Ambulatory Visit | Attending: Obstetrics and Gynecology | Admitting: Obstetrics and Gynecology

## 2022-09-21 DIAGNOSIS — N950 Postmenopausal bleeding: Secondary | ICD-10-CM | POA: Insufficient documentation

## 2022-09-21 DIAGNOSIS — R9389 Abnormal findings on diagnostic imaging of other specified body structures: Secondary | ICD-10-CM

## 2022-09-21 DIAGNOSIS — Z30430 Encounter for insertion of intrauterine contraceptive device: Secondary | ICD-10-CM | POA: Diagnosis not present

## 2022-09-21 NOTE — Patient Instructions (Signed)
Endometrial Biopsy  An endometrial biopsy is a procedure to remove tissue samples from the endometrium, which is the lining of the uterus. The tissue that is removed can then be checked under a microscope for disease. This procedure is used to diagnose conditions such as endometrial cancer, endometrial tuberculosis, polyps, or other inflammatory conditions. This procedure may also be used to investigate uterine bleeding to determine where you are in your menstrual cycle or how your hormone levels are affecting the lining of the uterus. Tell a health care provider about: Any allergies you have. All medicines you are taking, including vitamins, herbs, eye drops, creams, and over-the-counter medicines. Any problems you or family members have had with anesthetic medicines. Any bleeding problems you have. Any surgeries you have had. Any medical conditions you have. Whether you are pregnant or may be pregnant. What are the risks? Your health care provider will talk with you about risks. These may include: Bleeding. Pelvic infection. Puncture of the wall of the uterus with the biopsy device (rare). Allergic reactions to medicines. What happens before the procedure? Keep a record of your menstrual cycles as told by your health care provider. You may need to schedule your procedure for a specific time in your cycle. Bring a sanitary pad in case you need to wear one after the procedure. Ask your health care provider about: Changing or stopping your regular medicines. These include any diabetes medicines or blood thinners you take. Taking medicines such as aspirin and ibuprofen. These medicines can thin your blood. Do not take these medicines unless your health care provider tells you to. Taking over-the-counter medicines, vitamins, herbs, and supplements. Plan to have someone take you home from the hospital or clinic. What happens during the procedure? You will lie on an exam table with your feet  and legs supported as in a pelvic exam. Your health care provider will insert an instrument into your vagina to see your cervix. Your cervix will be cleansed with an antiseptic solution. A medicine (local anesthetic) will be used to numb the cervix. A forceps instrument will be used to hold your cervix steady for the biopsy. A thin, rod-like instrument (uterine sound) will be inserted through your cervix to determine the length of your uterus and the location where the biopsy sample will be removed. A thin, flexible tube (catheter) will be inserted through your cervix and into the uterus. The catheter will be used to collect the biopsy sample from your endometrial tissue. The tube and instruments will be removed, and the tissue sample will be sent to a lab for examination. The procedure may vary among health care providers and hospitals. What happens after the procedure? Your blood pressure, heart rate, breathing rate, and blood oxygen level will be monitored until you leave the hospital or clinic. It is up to you to get the results of your procedure. Ask your health care provider, or the department that is doing the procedure, when your results will be ready. Summary An endometrial biopsy is a procedure to remove tissue samples from the endometrium, which is the lining of the uterus. This procedure is used to diagnose conditions such as endometrial cancer, endometrial tuberculosis, polyps, or other inflammatory conditions. It is up to you to get the results of your procedure. Ask your health care provider, or the department that is doing the procedure, when your results will be ready. This information is not intended to replace advice given to you by your health care provider. Make sure you   discuss any questions you have with your health care provider. Document Revised: 09/15/2021 Document Reviewed: 09/15/2021 Elsevier Patient Education  2023 Elsevier Inc.  

## 2022-09-21 NOTE — Addendum Note (Signed)
Addended by: Ardell Isaacs, Debbe Bales E on: 09/21/2022 04:34 PM   Modules accepted: Orders

## 2022-09-22 ENCOUNTER — Ambulatory Visit: Payer: BC Managed Care – PPO | Admitting: Obstetrics and Gynecology

## 2022-09-23 LAB — SURGICAL PATHOLOGY

## 2022-10-04 DIAGNOSIS — M9903 Segmental and somatic dysfunction of lumbar region: Secondary | ICD-10-CM | POA: Diagnosis not present

## 2022-10-04 DIAGNOSIS — M9905 Segmental and somatic dysfunction of pelvic region: Secondary | ICD-10-CM | POA: Diagnosis not present

## 2022-10-04 DIAGNOSIS — M9902 Segmental and somatic dysfunction of thoracic region: Secondary | ICD-10-CM | POA: Diagnosis not present

## 2022-10-04 DIAGNOSIS — M9901 Segmental and somatic dysfunction of cervical region: Secondary | ICD-10-CM | POA: Diagnosis not present

## 2022-10-19 DIAGNOSIS — M9905 Segmental and somatic dysfunction of pelvic region: Secondary | ICD-10-CM | POA: Diagnosis not present

## 2022-10-19 DIAGNOSIS — M9903 Segmental and somatic dysfunction of lumbar region: Secondary | ICD-10-CM | POA: Diagnosis not present

## 2022-10-19 DIAGNOSIS — M9902 Segmental and somatic dysfunction of thoracic region: Secondary | ICD-10-CM | POA: Diagnosis not present

## 2022-10-19 DIAGNOSIS — M9901 Segmental and somatic dysfunction of cervical region: Secondary | ICD-10-CM | POA: Diagnosis not present

## 2022-11-18 DIAGNOSIS — M9901 Segmental and somatic dysfunction of cervical region: Secondary | ICD-10-CM | POA: Diagnosis not present

## 2022-11-18 DIAGNOSIS — M9902 Segmental and somatic dysfunction of thoracic region: Secondary | ICD-10-CM | POA: Diagnosis not present

## 2022-11-18 DIAGNOSIS — M9903 Segmental and somatic dysfunction of lumbar region: Secondary | ICD-10-CM | POA: Diagnosis not present

## 2022-11-18 DIAGNOSIS — M9905 Segmental and somatic dysfunction of pelvic region: Secondary | ICD-10-CM | POA: Diagnosis not present

## 2022-11-22 NOTE — Patient Instructions (Incomplete)
Asthma Continue albuterol once every 4 hours as needed for cough or wheeze You may use albuterol 2 puffs 5 to 15 minutes before activity to decrease cough or wheeze Continue Arnuity 100 mcg 1 puff once a day to help prevent cough and wheeze  Allergic rhinitis  skin testing on 05/18/21 was positive to: grass pollens, weed pollens, tree pollens, molds, dust mites, cats, dogs, horse, and cockroach. Continue allergen avoidance measures directed toward grass pollen, weed pollen, tree pollen, mold, dust mite, cockroach, and pets as listed below May useover-the-counter Astelin (azelastine) nasal spray 1 to 2 sprays each nostril twice a day as needed for runny nose/drainage down the throat.  May use an antihistamine such as Claritin, Zyrtec, Allegra, or Xyzal once a day as needed  Food allergy Continue to avoid fish, shellfish, and mammal meat.  In case of an allergic reaction, take Benadryl 50 mg capsules every 6 hours, and if life-threatening symptoms occur, inject with EpiPen 0.3 mg. Consider getting alpha gal at next office visit.  Heartburn  Continue Pepcid (famotidine) once a day as per your primary care physician Dietary and lifestyle modifications.  Form given  Call the clinic if this treatment plan is not working well for you  Follow up in 6 months or sooner if needed.  Reducing Pollen Exposure The American Academy of Allergy, Asthma and Immunology suggests the following steps to reduce your exposure to pollen during allergy seasons. Do not hang sheets or clothing out to dry; pollen may collect on these items. Do not mow lawns or spend time around freshly cut grass; mowing stirs up pollen. Keep windows closed at night.  Keep car windows closed while driving. Minimize morning activities outdoors, a time when pollen counts are usually at their highest. Stay indoors as much as possible when pollen counts or humidity is high and on windy days when pollen tends to remain in the air  longer. Use air conditioning when possible.  Many air conditioners have filters that trap the pollen spores. Use a HEPA room air filter to remove pollen form the indoor air you breathe.  Control of Mold Allergen Mold and fungi can grow on a variety of surfaces provided certain temperature and moisture conditions exist.  Outdoor molds grow on plants, decaying vegetation and soil.  The major outdoor mold, Alternaria and Cladosporium, are found in very high numbers during hot and dry conditions.  Generally, a late Summer - Fall peak is seen for common outdoor fungal spores.  Rain will temporarily lower outdoor mold spore count, but counts rise rapidly when the rainy period ends.  The most important indoor molds are Aspergillus and Penicillium.  Dark, humid and poorly ventilated basements are ideal sites for mold growth.  The next most common sites of mold growth are the bathroom and the kitchen.  Outdoor [employer] Use air conditioning and keep windows closed Avoid exposure to decaying vegetation. Avoid leaf raking. Avoid grain handling. Consider wearing a face mask if working in moldy areas.  Indoor Mold Control Maintain humidity below 50%. Clean washable surfaces with 5% bleach solution. Remove sources e.g. Contaminated carpets.   Control of Dust Mite Allergen Dust mites play a major role in allergic asthma and rhinitis. They occur in environments with high humidity wherever human skin is found. Dust mites absorb humidity from the atmosphere (ie, they do not drink) and feed on organic matter (including shed human and animal skin). Dust mites are a microscopic type of insect that you cannot see with  the naked eye. High levels of dust mites have been detected from mattresses, pillows, carpets, upholstered furniture, bed covers, clothes, soft toys and any woven material. The principal allergen of the dust mite is found in its feces. A gram of dust may contain 1,000 mites and 250,000 fecal  particles. Mite antigen is easily measured in the air during house cleaning activities. Dust mites do not bite and do not cause harm to humans, other than by triggering allergies/asthma.  Ways to decrease your exposure to dust mites in your home:  1. Encase mattresses, box springs and pillows with a mite-impermeable barrier or cover  2. Wash sheets, blankets and drapes weekly in hot water (130 F) with detergent and dry them in a dryer on the hot setting.  3. Have the room cleaned frequently with a vacuum cleaner and a damp dust-mop. For carpeting or rugs, vacuuming with a vacuum cleaner equipped with a high-efficiency particulate air (HEPA) filter. The dust mite allergic individual should not be in a room which is being cleaned and should wait 1 hour after cleaning before going into the room.  4. Do not sleep on upholstered furniture (eg, couches).  5. If possible removing carpeting, upholstered furniture and drapery from the home is ideal. Horizontal blinds should be eliminated in the rooms where the person spends the most time (bedroom, study, television room). Washable vinyl, roller-type shades are optimal.  6. Remove all non-washable stuffed toys from the bedroom. Wash stuffed toys weekly like sheets and blankets above.  7. Reduce indoor humidity to less than 50%. Inexpensive humidity monitors can be purchased at most hardware stores. Do not use a humidifier as can make the problem worse and are not recommended.  Control of Cockroach Allergen Cockroach allergen has been identified as an important cause of acute attacks of asthma, especially in urban settings.  There are fifty-five species of cockroach that exist in the [place], however only three, the [place], [place] species produce allergen that can affect patients with Asthma.  Allergens can be obtained from fecal particles, egg casings and secretions from cockroaches.    Remove food sources. Reduce access to  water. Seal access and entry points. Spray runways with 0.5-1% Diazinon or Chlorpyrifos Blow boric acid power under stoves and refrigerator. Place bait stations (hydramethylnon) at feeding sites.   Control of Dog or Cat Allergen Avoidance is the best way to manage a dog or cat allergy. If you have a dog or cat and are allergic to dog or cats, consider removing the dog or cat from the home. If you have a dog or cat but don't want to find it a new home, or if your family wants a pet even though someone in the household is allergic, here are some strategies that may help keep symptoms at bay:  Keep the pet out of your bedroom and restrict it to only a few rooms. Be advised that keeping the dog or cat in only one room will not limit the allergens to that room. Don't pet, hug or kiss the dog or cat; if you do, wash your hands with soap and water. High-efficiency particulate air (HEPA) cleaners run continuously in a bedroom or living room can reduce allergen levels over time. Regular use of a high-efficiency vacuum cleaner or a central vacuum can reduce allergen levels. Giving your dog or cat a bath at least once a week can reduce airborne allergen.

## 2022-11-23 ENCOUNTER — Ambulatory Visit: Payer: BC Managed Care – PPO | Admitting: Family

## 2022-11-23 ENCOUNTER — Encounter: Payer: Self-pay | Admitting: Family

## 2022-11-23 ENCOUNTER — Telehealth: Payer: Self-pay

## 2022-11-23 ENCOUNTER — Other Ambulatory Visit: Payer: Self-pay

## 2022-11-23 VITALS — BP 124/74 | HR 62 | Temp 97.7°F | Resp 16 | Ht 67.0 in | Wt 251.7 lb

## 2022-11-23 DIAGNOSIS — J302 Other seasonal allergic rhinitis: Secondary | ICD-10-CM

## 2022-11-23 DIAGNOSIS — J4540 Moderate persistent asthma, uncomplicated: Secondary | ICD-10-CM | POA: Diagnosis not present

## 2022-11-23 DIAGNOSIS — J3089 Other allergic rhinitis: Secondary | ICD-10-CM

## 2022-11-23 DIAGNOSIS — R12 Heartburn: Secondary | ICD-10-CM | POA: Diagnosis not present

## 2022-11-23 DIAGNOSIS — L0231 Cutaneous abscess of buttock: Secondary | ICD-10-CM | POA: Diagnosis not present

## 2022-11-23 DIAGNOSIS — T7809XD Anaphylactic reaction due to other food products, subsequent encounter: Secondary | ICD-10-CM

## 2022-11-23 MED ORDER — EPINEPHRINE 0.3 MG/0.3ML IJ SOAJ: 0.3000 mg | INTRAMUSCULAR | 1 refills | Status: AC | PRN

## 2022-11-23 NOTE — Progress Notes (Signed)
[address] [place] [address] Dept: [phone]  FOLLOW UP NOTE  Patient ID: Monica Good, female    DOB: 12/30/1965  Age: 57 y.o. MRN: [id number] Date of Office Visit: 11/23/2022  Assessment  Chief Complaint: No chief complaint on file.  HPI Monica Good is a 57 year old female who presents today for follow-up of moderate persistent asthma without complication, seasonal and perennial allergic rhinitis, heartburn, and anaphylactic reaction due to other food products.  She denies any new diagnosis or surgery since her last office visit.  She is now taking Ozempic.  Moderate persistent asthma is reported as doing perfect.  She is currently taking Arnuity 100 mcg 1 puff once a day and has albuterol as needed.  She has used her albuterol inhaler once just in case when she did yard work.  She denies cough, wheeze, tightness in chest, shortness of breath, and nocturnal awakenings due to breathing problems.  Since her last office visit she has not required any systemic steroids or made any trips to the emergency room or urgent care due to breathing problems.  Allergic rhinitis is reported as doing good since stopping allergy injections.  She reports rhinorrhea every so often that is not too bothersome.  She denies nasal congestion and postnasal drip.  She is currently not taking any antihistamine or using azelastine nasal spray.  She has not been treated for any sinus infections since we last saw her.  Food allergy: She continues to avoid fish, shellfish, and mammalian meat without any accidental ingestion or use of her epinephrine autoinjector device.  Heartburn: She reports very rare heartburn or reflux symptoms.  She is now currently taking famotidine at night.  She reports her primary care physician stopped the omeprazole 20 mg.   Drug Allergies:  Allergies  Allergen Reactions   Alpha-Gal Anaphylaxis   Beef-Derived Products Anaphylaxis   Fish Allergy Anaphylaxis   Lambs  Quarters Anaphylaxis    Has not had but is warned by allergist that could have anaphylaxis.   Other Anaphylaxis    **SEAFOOD**   Pork-Derived Products Anaphylaxis   Shellfish Allergy Anaphylaxis   Bee Venom Swelling   Latex Hives   Peanut Oil Itching    Hands itch, just peanut ,  Tree nuts okay    Review of Systems: Review of Systems  Constitutional:  Negative for chills and fever.  HENT:         Reports rhinorrhea every now and then.  Denies nasal congestion and postnasal drip  Eyes:        Denies itchy watery eyes  Respiratory:  Negative for cough, shortness of breath and wheezing.        Denies cough, wheeze, tightness in chest, shortness of breath, and nocturnal awakenings due to breathing problems.  Cardiovascular:  Negative for chest pain and palpitations.  Gastrointestinal:        Reports very rare heartburn/reflux.  Now currently taking Pepcid at night as prescribed by her primary care physician  Skin:  Negative for itching and rash.  Neurological:  Negative for headaches.  Endo/Heme/Allergies:  Positive for environmental allergies.     Physical Exam: BP 124/74 (BP Location: Left Arm, Patient Position: Sitting, Cuff Size: Large)   Pulse 62   Temp 97.7 F (36.5 C) (Temporal)   Resp 16   Ht 5\' 7"  (1.702 m)   Wt 251 lb 11.2 oz (114.2 kg)   LMP 06/14/2016 (Approximate)   SpO2 97%   BMI 39.42 kg/m  Physical Exam Constitutional:      Appearance: Normal appearance.  HENT:     Head: Normocephalic and atraumatic.     Comments: Pharynx normal, eyes normal, ears normal, nose normal    Right Ear: Tympanic membrane, ear canal and external ear normal.     Left Ear: Tympanic membrane, ear canal and external ear normal.     Nose: Nose normal.     Mouth/Throat:     Mouth: Mucous membranes are moist.     Pharynx: Oropharynx is clear.  Eyes:     Conjunctiva/sclera: Conjunctivae normal.  Cardiovascular:     Rate and Rhythm: Regular rhythm.     Heart sounds: Normal  heart sounds.  Pulmonary:     Effort: Pulmonary effort is normal.     Breath sounds: Normal breath sounds.     Comments: Lungs clear to auscultation Musculoskeletal:     Cervical back: Neck supple.  Skin:    General: Skin is warm.  Neurological:     Mental Status: She is alert and oriented to person, place, and time.  Psychiatric:        Mood and Affect: Mood normal.        Behavior: Behavior normal.        Thought Content: Thought content normal.        Judgment: Judgment normal.     Diagnostics: FVC 2.89 L (81%), FEV1 2.33 L (84%).  Predicted FVC 3.55 L, predicted FEV1 2.79.  Spirometry indicates normal respiratory function.  Assessment and Plan: 1. Moderate persistent asthma without complication   2. Seasonal and perennial allergic rhinitis   3. Anaphylactic reaction due to other food products, subsequent encounter   4. Heartburn     Meds ordered this encounter  Medications   EPINEPHrine 0.3 mg/0.3 mL IJ SOAJ injection    Sig: Inject 0.3 mg into the muscle as needed.    Dispense:  2 each    Refill:  1    TEVA OR MYLAN ONLY    Patient Instructions  Asthma Continue albuterol once every 4 hours as needed for cough or wheeze You may use albuterol 2 puffs 5 to 15 minutes before activity to decrease cough or wheeze Continue Arnuity 100 mcg 1 puff once a day to help prevent cough and wheeze  Allergic rhinitis  skin testing on 05/18/21 was positive to: grass pollens, weed pollens, tree pollens, molds, dust mites, cats, dogs, horse, and cockroach. Continue allergen avoidance measures directed toward grass pollen, weed pollen, tree pollen, mold, dust mite, cockroach, and pets as listed below May useover-the-counter Astelin (azelastine) nasal spray 1 to 2 sprays each nostril twice a day as needed for runny nose/drainage down the throat.  May use an antihistamine such as Claritin, Zyrtec, Allegra, or Xyzal once a day as needed  Food allergy Continue to avoid fish, shellfish,  and mammal meat.  In case of an allergic reaction, take Benadryl 50 mg capsules every 6 hours, and if life-threatening symptoms occur, inject with EpiPen 0.3 mg. Consider getting alpha gal at next office visit.  Heartburn  Continue Pepcid (famotidine) once a day as per your primary care physician Dietary and lifestyle modifications.  Form given  Call the clinic if this treatment plan is not working well for you  Follow up in 6 months or sooner if needed.  Reducing Pollen Exposure The American Academy of Allergy, Asthma and Immunology suggests the following steps to reduce your exposure to pollen during allergy seasons. Do not hang sheets  or clothing out to dry; pollen may collect on these items. Do not mow lawns or spend time around freshly cut grass; mowing stirs up pollen. Keep windows closed at night.  Keep car windows closed while driving. Minimize morning activities outdoors, a time when pollen counts are usually at their highest. Stay indoors as much as possible when pollen counts or humidity is high and on windy days when pollen tends to remain in the air longer. Use air conditioning when possible.  Many air conditioners have filters that trap the pollen spores. Use a HEPA room air filter to remove pollen form the indoor air you breathe.  Control of Mold Allergen Mold and fungi can grow on a variety of surfaces provided certain temperature and moisture conditions exist.  Outdoor molds grow on plants, decaying vegetation and soil.  The major outdoor mold, Alternaria and Cladosporium, are found in very high numbers during hot and dry conditions.  Generally, a late Summer - Fall peak is seen for common outdoor fungal spores.  Rain will temporarily lower outdoor mold spore count, but counts rise rapidly when the rainy period ends.  The most important indoor molds are Aspergillus and Penicillium.  Dark, humid and poorly ventilated basements are ideal sites for mold growth.  The next most  common sites of mold growth are the bathroom and the kitchen.  Outdoor [employer] Use air conditioning and keep windows closed Avoid exposure to decaying vegetation. Avoid leaf raking. Avoid grain handling. Consider wearing a face mask if working in moldy areas.  Indoor Mold Control Maintain humidity below 50%. Clean washable surfaces with 5% bleach solution. Remove sources e.g. Contaminated carpets.   Control of Dust Mite Allergen Dust mites play a major role in allergic asthma and rhinitis. They occur in environments with high humidity wherever human skin is found. Dust mites absorb humidity from the atmosphere (ie, they do not drink) and feed on organic matter (including shed human and animal skin). Dust mites are a microscopic type of insect that you cannot see with the naked eye. High levels of dust mites have been detected from mattresses, pillows, carpets, upholstered furniture, bed covers, clothes, soft toys and any woven material. The principal allergen of the dust mite is found in its feces. A gram of dust may contain 1,000 mites and 250,000 fecal particles. Mite antigen is easily measured in the air during house cleaning activities. Dust mites do not bite and do not cause harm to humans, other than by triggering allergies/asthma.  Ways to decrease your exposure to dust mites in your home:  1. Encase mattresses, box springs and pillows with a mite-impermeable barrier or cover  2. Wash sheets, blankets and drapes weekly in hot water (130 F) with detergent and dry them in a dryer on the hot setting.  3. Have the room cleaned frequently with a vacuum cleaner and a damp dust-mop. For carpeting or rugs, vacuuming with a vacuum cleaner equipped with a high-efficiency particulate air (HEPA) filter. The dust mite allergic individual should not be in a room which is being cleaned and should wait 1 hour after cleaning before going into the room.  4. Do not sleep on upholstered furniture  (eg, couches).  5. If possible removing carpeting, upholstered furniture and drapery from the home is ideal. Horizontal blinds should be eliminated in the rooms where the person spends the most time (bedroom, study, television room). Washable vinyl, roller-type shades are optimal.  6. Remove all non-washable stuffed toys from the bedroom.  Wash stuffed toys weekly like sheets and blankets above.  7. Reduce indoor humidity to less than 50%. Inexpensive humidity monitors can be purchased at most hardware stores. Do not use a humidifier as can make the problem worse and are not recommended.  Control of Cockroach Allergen Cockroach allergen has been identified as an important cause of acute attacks of asthma, especially in urban settings.  There are fifty-five species of cockroach that exist in the [place], however only three, the [place], [place] species produce allergen that can affect patients with Asthma.  Allergens can be obtained from fecal particles, egg casings and secretions from cockroaches.    Remove food sources. Reduce access to water. Seal access and entry points. Spray runways with 0.5-1% Diazinon or Chlorpyrifos Blow boric acid power under stoves and refrigerator. Place bait stations (hydramethylnon) at feeding sites.   Control of Dog or Cat Allergen Avoidance is the best way to manage a dog or cat allergy. If you have a dog or cat and are allergic to dog or cats, consider removing the dog or cat from the home. If you have a dog or cat but don't want to find it a new home, or if your family wants a pet even though someone in the household is allergic, here are some strategies that may help keep symptoms at bay:  Keep the pet out of your bedroom and restrict it to only a few rooms. Be advised that keeping the dog or cat in only one room will not limit the allergens to that room. Don't pet, hug or kiss the dog or cat; if you do, wash your hands with soap and  water. High-efficiency particulate air (HEPA) cleaners run continuously in a bedroom or living room can reduce allergen levels over time. Regular use of a high-efficiency vacuum cleaner or a central vacuum can reduce allergen levels. Giving your dog or cat a bath at least once a week can reduce airborne allergen.  Return in about 6 months (around 05/25/2023).    Thank you for the opportunity to care for this patient.  Please do not hesitate to contact me with questions.  Nehemiah Settle, FNP [facility]

## 2022-11-24 NOTE — Telephone Encounter (Signed)
Pulmicort 180mcg flexhalr (120puffs) medication on back order please send alternative to  [facility] [place] [phone] 

## 2022-12-03 ENCOUNTER — Other Ambulatory Visit: Payer: Self-pay | Admitting: Family

## 2022-12-15 DIAGNOSIS — M9903 Segmental and somatic dysfunction of lumbar region: Secondary | ICD-10-CM | POA: Diagnosis not present

## 2022-12-15 DIAGNOSIS — M9902 Segmental and somatic dysfunction of thoracic region: Secondary | ICD-10-CM | POA: Diagnosis not present

## 2022-12-15 DIAGNOSIS — M9901 Segmental and somatic dysfunction of cervical region: Secondary | ICD-10-CM | POA: Diagnosis not present

## 2022-12-15 DIAGNOSIS — M9905 Segmental and somatic dysfunction of pelvic region: Secondary | ICD-10-CM | POA: Diagnosis not present

## 2022-12-20 DIAGNOSIS — M9901 Segmental and somatic dysfunction of cervical region: Secondary | ICD-10-CM | POA: Diagnosis not present

## 2022-12-20 DIAGNOSIS — M9903 Segmental and somatic dysfunction of lumbar region: Secondary | ICD-10-CM | POA: Diagnosis not present

## 2022-12-20 DIAGNOSIS — M9902 Segmental and somatic dysfunction of thoracic region: Secondary | ICD-10-CM | POA: Diagnosis not present

## 2022-12-20 DIAGNOSIS — M9905 Segmental and somatic dysfunction of pelvic region: Secondary | ICD-10-CM | POA: Diagnosis not present

## 2022-12-24 DIAGNOSIS — M9901 Segmental and somatic dysfunction of cervical region: Secondary | ICD-10-CM | POA: Diagnosis not present

## 2022-12-24 DIAGNOSIS — M9903 Segmental and somatic dysfunction of lumbar region: Secondary | ICD-10-CM | POA: Diagnosis not present

## 2022-12-24 DIAGNOSIS — M9902 Segmental and somatic dysfunction of thoracic region: Secondary | ICD-10-CM | POA: Diagnosis not present

## 2022-12-24 DIAGNOSIS — M9905 Segmental and somatic dysfunction of pelvic region: Secondary | ICD-10-CM | POA: Diagnosis not present

## 2023-01-07 DIAGNOSIS — M9905 Segmental and somatic dysfunction of pelvic region: Secondary | ICD-10-CM | POA: Diagnosis not present

## 2023-01-07 DIAGNOSIS — M9903 Segmental and somatic dysfunction of lumbar region: Secondary | ICD-10-CM | POA: Diagnosis not present

## 2023-01-07 DIAGNOSIS — M9901 Segmental and somatic dysfunction of cervical region: Secondary | ICD-10-CM | POA: Diagnosis not present

## 2023-01-07 DIAGNOSIS — M9902 Segmental and somatic dysfunction of thoracic region: Secondary | ICD-10-CM | POA: Diagnosis not present

## 2023-02-07 DIAGNOSIS — M9901 Segmental and somatic dysfunction of cervical region: Secondary | ICD-10-CM | POA: Diagnosis not present

## 2023-02-07 DIAGNOSIS — M9903 Segmental and somatic dysfunction of lumbar region: Secondary | ICD-10-CM | POA: Diagnosis not present

## 2023-02-07 DIAGNOSIS — M9902 Segmental and somatic dysfunction of thoracic region: Secondary | ICD-10-CM | POA: Diagnosis not present

## 2023-02-07 DIAGNOSIS — M9905 Segmental and somatic dysfunction of pelvic region: Secondary | ICD-10-CM | POA: Diagnosis not present

## 2023-02-10 DIAGNOSIS — M9902 Segmental and somatic dysfunction of thoracic region: Secondary | ICD-10-CM | POA: Diagnosis not present

## 2023-02-10 DIAGNOSIS — M9903 Segmental and somatic dysfunction of lumbar region: Secondary | ICD-10-CM | POA: Diagnosis not present

## 2023-02-10 DIAGNOSIS — M9901 Segmental and somatic dysfunction of cervical region: Secondary | ICD-10-CM | POA: Diagnosis not present

## 2023-02-10 DIAGNOSIS — M9905 Segmental and somatic dysfunction of pelvic region: Secondary | ICD-10-CM | POA: Diagnosis not present

## 2023-02-16 DIAGNOSIS — M9902 Segmental and somatic dysfunction of thoracic region: Secondary | ICD-10-CM | POA: Diagnosis not present

## 2023-02-16 DIAGNOSIS — M9903 Segmental and somatic dysfunction of lumbar region: Secondary | ICD-10-CM | POA: Diagnosis not present

## 2023-02-16 DIAGNOSIS — M9905 Segmental and somatic dysfunction of pelvic region: Secondary | ICD-10-CM | POA: Diagnosis not present

## 2023-02-16 DIAGNOSIS — M9901 Segmental and somatic dysfunction of cervical region: Secondary | ICD-10-CM | POA: Diagnosis not present

## 2023-02-22 DIAGNOSIS — R5383 Other fatigue: Secondary | ICD-10-CM | POA: Diagnosis not present

## 2023-02-22 DIAGNOSIS — R799 Abnormal finding of blood chemistry, unspecified: Secondary | ICD-10-CM | POA: Diagnosis not present

## 2023-02-22 DIAGNOSIS — E559 Vitamin D deficiency, unspecified: Secondary | ICD-10-CM | POA: Diagnosis not present

## 2023-02-22 DIAGNOSIS — R7301 Impaired fasting glucose: Secondary | ICD-10-CM | POA: Diagnosis not present

## 2023-02-22 DIAGNOSIS — E782 Mixed hyperlipidemia: Secondary | ICD-10-CM | POA: Diagnosis not present

## 2023-02-22 DIAGNOSIS — E063 Autoimmune thyroiditis: Secondary | ICD-10-CM | POA: Diagnosis not present

## 2023-02-22 DIAGNOSIS — D6489 Other specified anemias: Secondary | ICD-10-CM | POA: Diagnosis not present

## 2023-02-22 DIAGNOSIS — E039 Hypothyroidism, unspecified: Secondary | ICD-10-CM | POA: Diagnosis not present

## 2023-03-02 DIAGNOSIS — M9903 Segmental and somatic dysfunction of lumbar region: Secondary | ICD-10-CM | POA: Diagnosis not present

## 2023-03-02 DIAGNOSIS — M9901 Segmental and somatic dysfunction of cervical region: Secondary | ICD-10-CM | POA: Diagnosis not present

## 2023-03-02 DIAGNOSIS — M9905 Segmental and somatic dysfunction of pelvic region: Secondary | ICD-10-CM | POA: Diagnosis not present

## 2023-03-02 DIAGNOSIS — M9902 Segmental and somatic dysfunction of thoracic region: Secondary | ICD-10-CM | POA: Diagnosis not present

## 2023-04-06 DIAGNOSIS — M9903 Segmental and somatic dysfunction of lumbar region: Secondary | ICD-10-CM | POA: Diagnosis not present

## 2023-04-06 DIAGNOSIS — M9902 Segmental and somatic dysfunction of thoracic region: Secondary | ICD-10-CM | POA: Diagnosis not present

## 2023-04-06 DIAGNOSIS — M9901 Segmental and somatic dysfunction of cervical region: Secondary | ICD-10-CM | POA: Diagnosis not present

## 2023-04-06 DIAGNOSIS — M9905 Segmental and somatic dysfunction of pelvic region: Secondary | ICD-10-CM | POA: Diagnosis not present

## 2023-05-09 DIAGNOSIS — M9905 Segmental and somatic dysfunction of pelvic region: Secondary | ICD-10-CM | POA: Diagnosis not present

## 2023-05-09 DIAGNOSIS — M9902 Segmental and somatic dysfunction of thoracic region: Secondary | ICD-10-CM | POA: Diagnosis not present

## 2023-05-09 DIAGNOSIS — M9903 Segmental and somatic dysfunction of lumbar region: Secondary | ICD-10-CM | POA: Diagnosis not present

## 2023-05-09 DIAGNOSIS — M9901 Segmental and somatic dysfunction of cervical region: Secondary | ICD-10-CM | POA: Diagnosis not present

## 2023-05-25 ENCOUNTER — Ambulatory Visit: Payer: BC Managed Care – PPO | Admitting: Internal Medicine

## 2023-05-25 ENCOUNTER — Encounter: Payer: Self-pay | Admitting: Internal Medicine

## 2023-05-25 VITALS — BP 128/66 | HR 75 | Temp 97.7°F | Resp 20 | Ht 67.0 in | Wt 255.9 lb

## 2023-05-25 DIAGNOSIS — J3089 Other allergic rhinitis: Secondary | ICD-10-CM | POA: Diagnosis not present

## 2023-05-25 DIAGNOSIS — J4540 Moderate persistent asthma, uncomplicated: Secondary | ICD-10-CM | POA: Diagnosis not present

## 2023-05-25 DIAGNOSIS — R12 Heartburn: Secondary | ICD-10-CM

## 2023-05-25 DIAGNOSIS — J302 Other seasonal allergic rhinitis: Secondary | ICD-10-CM

## 2023-05-25 DIAGNOSIS — T7809XD Anaphylactic reaction due to other food products, subsequent encounter: Secondary | ICD-10-CM | POA: Diagnosis not present

## 2023-05-25 MED ORDER — AIRSUPRA 90-80 MCG/ACT IN AERO: 2.0000 | INHALATION_SPRAY | RESPIRATORY_TRACT | 5 refills | Status: AC | PRN

## 2023-05-25 NOTE — Progress Notes (Signed)
FOLLOW UP Date of Service/Encounter:  05/29/23  Subjective:  Monica Good (DOB: Oct 25, 1965) is a 57 y.o. female who [facility] in re-evaluation of the following: asthma, allergic rhinitis, food allergy, heartburn  History obtained from: chart review and patient.  For Review, LV was on 11/23/22  with Nehemiah Settle, FNP seen for routine follow-up. See below for summary of history and diagnostics.   Today presents for follow-up. Discussed the use of AI scribe software for clinical note transcription with the patient, who gave verbal consent to proceed.  History of Present Illness     Asthma: on albuterol and arnuity 1 puff daily.   No albuterol use since last visit.  No breakthrough cough, wheeze, dyspnea.  Is interested in stepping down.  No OCS/ED/UC visit.    Rhinitis: mild intermittent nasal congestion.  Is not using astelin or antihistamines   FA: still avoiding fish, shellfish and red meat.  Has had a few accidental ingestion a [employer] due to cross contamination with bacon.  Caused throat tightness.  She will rinse her mouth and has not needed epipen.     All medications reviewed by clinical staff and updated in chart. No new pertinent medical or surgical history except as noted in HPI.  ROS: All others negative except as noted per HPI.   Objective:  BP 128/66 (BP Location: Left Arm, Patient Position: Sitting, Cuff Size: Normal)   Pulse 75   Temp 97.7 F (36.5 C) (Temporal)   Resp 20   Ht 5\' 7"  (1.702 m)   Wt 255 lb 14.4 oz (116.1 kg)   LMP 06/14/2016 (Approximate)   SpO2 99%   BMI 40.08 kg/m  Body mass index is 40.08 kg/m. Physical Exam: General Appearance:  Alert, cooperative, no distress, appears stated age  Head:  Normocephalic, without obvious abnormality, atraumatic  Eyes:  Conjunctiva clear, EOM's intact  Ears EACs normal bilaterally  Nose: Nares normal,  erythematous and edematous nasal mucosa ,  hypertrophic turbinates, no visible anterior polyps, and septum midline  Throat: Lips, tongue normal; teeth and gums normal, + cobblestoning  Neck: Supple, symmetrical  Lungs:   clear to auscultation bilaterally, Respirations unlabored, no coughing  Heart:  regular rate and rhythm and no murmur, Appears well perfused  Extremities: No edema  Skin: Skin color, texture, turgor normal and no rashes or lesions on visualized portions of skin  Neurologic: No gross deficits   Labs:  Lab Orders         Alpha-Gal Panel      Spirometry:  Tracings reviewed. Her effort: Good reproducible efforts. FVC: 2.91 L FEV1: 2.36 L, 85% predicted FEV1/FVC ratio: 81% Interpretation: Spirometry consistent with normal pattern.  Please see scanned spirometry results for details.  Assessment/Plan   Asthma: Well-controlled Breathing test looked good  We will step down therapy: Stop Arnuity and albuterol Start: Airsupra 2 puffs as needed every 4 hours for cough, wheeze, dyspnea  -Rinse mouth after use do not use more than 12 puffs in a day  Allergic rhinitis: Mild nasal congestion  skin testing on 05/18/21 was positive to: grass pollens, weed pollens, tree pollens, molds, dust mites, cats, dogs, horse, and cockroach. Continue allergen avoidance measures directed toward grass pollen, weed pollen, tree pollen, mold, dust mite, cockroach, and pets as listed below May use Astelin (azelastine) nasal spray 1 to 2 sprays each nostril twice a day as needed for nasal congestion May use an antihistamine such as Claritin, Zyrtec,  Allegra, or Xyzal once a day as needed  Food allergy Continue to avoid fish, shellfish, and mammal meat.  In case of an allergic reaction, take Benadryl 50 mg capsules every 6 hours, and if life-threatening symptoms occur, inject with EpiPen 0.3 mg. Alpha gal panel ordered, walking next week to get drawn  Heartburn  Continue Pepcid (famotidine) once a day as per your primary care  physician Dietary and lifestyle modifications.   Call the clinic if this treatment plan is not working well for you  Follow up in 6 months or sooner if needed.  Other: samples provided of: air supra   Thank you so much for letting me partake in your care today.  Don't hesitate to reach out if you have any additional concerns!  Ferol Luz, MD  [facility] [place] [place]

## 2023-05-25 NOTE — Patient Instructions (Addendum)
Asthma: Well-controlled Breathing test looked good  We will step down therapy: Stop Arnuity and albuterol Start: Airsupra 2 puffs as needed every 4 hours for cough, wheeze, dyspnea  -Rinse mouth after use do not use more than 12 puffs in a day  Allergic rhinitis: Mild nasal congestion  skin testing on 05/18/21 was positive to: grass pollens, weed pollens, tree pollens, molds, dust mites, cats, dogs, horse, and cockroach. Continue allergen avoidance measures directed toward grass pollen, weed pollen, tree pollen, mold, dust mite, cockroach, and pets as listed below May use Astelin (azelastine) nasal spray 1 to 2 sprays each nostril twice a day as needed for nasal congestion May use an antihistamine such as Claritin, Zyrtec, Allegra, or Xyzal once a day as needed  Food allergy Continue to avoid fish, shellfish, and mammal meat.  In case of an allergic reaction, take Benadryl 50 mg capsules every 6 hours, and if life-threatening symptoms occur, inject with EpiPen 0.3 mg. Alpha gal panel ordered, walking next week to get drawn  Heartburn  Continue Pepcid (famotidine) once a day as per your primary care physician Dietary and lifestyle modifications.   Call the clinic if this treatment plan is not working well for you  Follow up in 6 months or sooner if needed.

## 2023-06-10 DIAGNOSIS — M9905 Segmental and somatic dysfunction of pelvic region: Secondary | ICD-10-CM | POA: Diagnosis not present

## 2023-06-10 DIAGNOSIS — M9901 Segmental and somatic dysfunction of cervical region: Secondary | ICD-10-CM | POA: Diagnosis not present

## 2023-06-10 DIAGNOSIS — M9903 Segmental and somatic dysfunction of lumbar region: Secondary | ICD-10-CM | POA: Diagnosis not present

## 2023-06-10 DIAGNOSIS — M9902 Segmental and somatic dysfunction of thoracic region: Secondary | ICD-10-CM | POA: Diagnosis not present

## 2023-06-22 DIAGNOSIS — M9903 Segmental and somatic dysfunction of lumbar region: Secondary | ICD-10-CM | POA: Diagnosis not present

## 2023-06-22 DIAGNOSIS — M9902 Segmental and somatic dysfunction of thoracic region: Secondary | ICD-10-CM | POA: Diagnosis not present

## 2023-06-22 DIAGNOSIS — M9901 Segmental and somatic dysfunction of cervical region: Secondary | ICD-10-CM | POA: Diagnosis not present

## 2023-06-22 DIAGNOSIS — M9905 Segmental and somatic dysfunction of pelvic region: Secondary | ICD-10-CM | POA: Diagnosis not present

## 2023-06-28 DIAGNOSIS — R09A2 Foreign body sensation, throat: Secondary | ICD-10-CM | POA: Diagnosis not present

## 2023-06-28 DIAGNOSIS — L253 Unspecified contact dermatitis due to other chemical products: Secondary | ICD-10-CM | POA: Diagnosis not present

## 2023-06-28 DIAGNOSIS — T7809XD Anaphylactic reaction due to other food products, subsequent encounter: Secondary | ICD-10-CM | POA: Diagnosis not present

## 2023-06-30 LAB — ALPHA-GAL PANEL
Allergen Lamb IgE: <0.1 kU/L
Beef IgE: 0.1 kU/L — AB
IgE (Immunoglobulin E), Serum: 106 [IU]/mL (ref 6–495)
O215-IgE Alpha-Gal: <0.1 kU/L
Pork IgE: <0.1 kU/L

## 2023-07-04 DIAGNOSIS — M9903 Segmental and somatic dysfunction of lumbar region: Secondary | ICD-10-CM | POA: Diagnosis not present

## 2023-07-04 DIAGNOSIS — M9905 Segmental and somatic dysfunction of pelvic region: Secondary | ICD-10-CM | POA: Diagnosis not present

## 2023-07-04 DIAGNOSIS — M9902 Segmental and somatic dysfunction of thoracic region: Secondary | ICD-10-CM | POA: Diagnosis not present

## 2023-07-04 DIAGNOSIS — M9901 Segmental and somatic dysfunction of cervical region: Secondary | ICD-10-CM | POA: Diagnosis not present

## 2023-07-06 NOTE — Progress Notes (Signed)
Alpha gal testing was negative. Can someone let patient know?

## 2023-07-27 DIAGNOSIS — M9901 Segmental and somatic dysfunction of cervical region: Secondary | ICD-10-CM | POA: Diagnosis not present

## 2023-07-27 DIAGNOSIS — M9902 Segmental and somatic dysfunction of thoracic region: Secondary | ICD-10-CM | POA: Diagnosis not present

## 2023-07-27 DIAGNOSIS — M9905 Segmental and somatic dysfunction of pelvic region: Secondary | ICD-10-CM | POA: Diagnosis not present

## 2023-07-27 DIAGNOSIS — M9903 Segmental and somatic dysfunction of lumbar region: Secondary | ICD-10-CM | POA: Diagnosis not present

## 2023-08-26 DIAGNOSIS — M9902 Segmental and somatic dysfunction of thoracic region: Secondary | ICD-10-CM | POA: Diagnosis not present

## 2023-08-26 DIAGNOSIS — M9901 Segmental and somatic dysfunction of cervical region: Secondary | ICD-10-CM | POA: Diagnosis not present

## 2023-08-26 DIAGNOSIS — M9903 Segmental and somatic dysfunction of lumbar region: Secondary | ICD-10-CM | POA: Diagnosis not present

## 2023-08-26 DIAGNOSIS — M9905 Segmental and somatic dysfunction of pelvic region: Secondary | ICD-10-CM | POA: Diagnosis not present

## 2023-08-29 DIAGNOSIS — R5383 Other fatigue: Secondary | ICD-10-CM | POA: Diagnosis not present

## 2023-08-29 DIAGNOSIS — E559 Vitamin D deficiency, unspecified: Secondary | ICD-10-CM | POA: Diagnosis not present

## 2023-08-29 DIAGNOSIS — D6489 Other specified anemias: Secondary | ICD-10-CM | POA: Diagnosis not present

## 2023-08-29 DIAGNOSIS — R7301 Impaired fasting glucose: Secondary | ICD-10-CM | POA: Diagnosis not present

## 2023-08-29 DIAGNOSIS — E782 Mixed hyperlipidemia: Secondary | ICD-10-CM | POA: Diagnosis not present

## 2023-08-29 DIAGNOSIS — R799 Abnormal finding of blood chemistry, unspecified: Secondary | ICD-10-CM | POA: Diagnosis not present

## 2023-08-29 DIAGNOSIS — E039 Hypothyroidism, unspecified: Secondary | ICD-10-CM | POA: Diagnosis not present

## 2023-08-29 DIAGNOSIS — E063 Autoimmune thyroiditis: Secondary | ICD-10-CM | POA: Diagnosis not present

## 2023-09-08 DIAGNOSIS — Z1231 Encounter for screening mammogram for malignant neoplasm of breast: Secondary | ICD-10-CM | POA: Diagnosis not present

## 2023-09-09 ENCOUNTER — Encounter: Payer: Self-pay | Admitting: Obstetrics and Gynecology

## 2023-09-22 ENCOUNTER — Telehealth: Payer: Self-pay

## 2023-09-22 NOTE — Telephone Encounter (Signed)
 Updated [facility] diagnosis T78.09XD added R09.AZ and L25.3

## 2023-09-28 DIAGNOSIS — M9901 Segmental and somatic dysfunction of cervical region: Secondary | ICD-10-CM | POA: Diagnosis not present

## 2023-09-28 DIAGNOSIS — M9903 Segmental and somatic dysfunction of lumbar region: Secondary | ICD-10-CM | POA: Diagnosis not present

## 2023-09-28 DIAGNOSIS — M9902 Segmental and somatic dysfunction of thoracic region: Secondary | ICD-10-CM | POA: Diagnosis not present

## 2023-09-28 DIAGNOSIS — M9905 Segmental and somatic dysfunction of pelvic region: Secondary | ICD-10-CM | POA: Diagnosis not present

## 2023-10-21 DIAGNOSIS — M9902 Segmental and somatic dysfunction of thoracic region: Secondary | ICD-10-CM | POA: Diagnosis not present

## 2023-10-21 DIAGNOSIS — M9901 Segmental and somatic dysfunction of cervical region: Secondary | ICD-10-CM | POA: Diagnosis not present

## 2023-10-21 DIAGNOSIS — M9905 Segmental and somatic dysfunction of pelvic region: Secondary | ICD-10-CM | POA: Diagnosis not present

## 2023-10-21 DIAGNOSIS — M9903 Segmental and somatic dysfunction of lumbar region: Secondary | ICD-10-CM | POA: Diagnosis not present

## 2023-11-04 DIAGNOSIS — M9901 Segmental and somatic dysfunction of cervical region: Secondary | ICD-10-CM | POA: Diagnosis not present

## 2023-11-04 DIAGNOSIS — M9905 Segmental and somatic dysfunction of pelvic region: Secondary | ICD-10-CM | POA: Diagnosis not present

## 2023-11-04 DIAGNOSIS — M9903 Segmental and somatic dysfunction of lumbar region: Secondary | ICD-10-CM | POA: Diagnosis not present

## 2023-11-04 DIAGNOSIS — M9902 Segmental and somatic dysfunction of thoracic region: Secondary | ICD-10-CM | POA: Diagnosis not present

## 2023-12-09 DIAGNOSIS — M9901 Segmental and somatic dysfunction of cervical region: Secondary | ICD-10-CM | POA: Diagnosis not present

## 2023-12-09 DIAGNOSIS — M9903 Segmental and somatic dysfunction of lumbar region: Secondary | ICD-10-CM | POA: Diagnosis not present

## 2023-12-09 DIAGNOSIS — M9905 Segmental and somatic dysfunction of pelvic region: Secondary | ICD-10-CM | POA: Diagnosis not present

## 2023-12-09 DIAGNOSIS — M9902 Segmental and somatic dysfunction of thoracic region: Secondary | ICD-10-CM | POA: Diagnosis not present

## 2023-12-12 ENCOUNTER — Ambulatory Visit (INDEPENDENT_AMBULATORY_CARE_PROVIDER_SITE_OTHER): Payer: BC Managed Care – PPO | Admitting: Obstetrics and Gynecology

## 2023-12-12 ENCOUNTER — Encounter: Payer: Self-pay | Admitting: Obstetrics and Gynecology

## 2023-12-12 VITALS — BP 118/76 | HR 77 | Ht 66.5 in | Wt 261.0 lb

## 2023-12-12 DIAGNOSIS — Z01419 Encounter for gynecological examination (general) (routine) without abnormal findings: Secondary | ICD-10-CM

## 2023-12-12 DIAGNOSIS — T8332XA Displacement of intrauterine contraceptive device, initial encounter: Secondary | ICD-10-CM | POA: Diagnosis not present

## 2023-12-12 DIAGNOSIS — Z1331 Encounter for screening for depression: Secondary | ICD-10-CM | POA: Diagnosis not present

## 2023-12-12 NOTE — Patient Instructions (Signed)

## 2023-12-12 NOTE — Progress Notes (Signed)
 58 y.o. G60P4004 Married Caucasian female here for annual exam.    Patient has a Mirena  IUD for postmenopausal bleeding, hx endometrial polyps, and proliferative endometrium.   Last office endometrial biopsy 09/21/22 showed inactive endometrium with stromas progesterone  changes.   No further vaginal bleeding.   Will be planning a future knee replacement.   Taking Ozempic for weight loss.   PCP: Claudene Pellet, MD   Patient's last menstrual period was 06/14/2016 (approximate).           Sexually active: Yes  The current method of family planning is post menopausal status.    Menopausal hormone therapy:  n/a Exercising: Yes.    Walking Smoker:  no  OB History  Gravida Para Term Preterm AB Living  4 4 4   4   SAB IAB Ectopic Multiple Live Births      4    # Outcome Date GA Lbr Len/2nd Weight Sex Type Anes PTL Lv  4 Term 2002    F Vag-Spont   LIV  3 Term 2001    F Vag-Spont   LIV  2 Term 1997    F Vag-Spont   LIV  1 Term 42    M Vag-Spont   LIV     HEALTH MAINTENANCE: Last 2 paps:  12/30/21 ASCUS HR HPV neg, 07/03/20 neg HR HPV neg History of abnormal Pap or positive HPV:  yes Mammogram:   09/08/23 Beast Density Cat B, BIRADS Cat 1 neg  Colonoscopy:  Scheduled 01/09/24 Bone Density:  n/a  Result  n/a   Immunization History  Administered Date(s) Administered   Moderna Sars-Covid-2 Vaccination 08/24/2019, 09/21/2019   Tdap 11/25/2016   Tetanus 06/14/2006      reports that she has never smoked. She has never used smokeless tobacco. She reports current alcohol use. She reports that she does not use drugs.  Past Medical History:  Diagnosis Date   Arthritis    Eczema    Endometrial mass    Environmental allergies    GERD (gastroesophageal reflux disease)    prn tums and watches diet   Hashimoto's disease    followed by dr sharyne pacini,  takes low dose naltrexone   History of DVT of lower extremity 1995   left thigh,  completed one year blood thinner   Hypertension     followed by pcp   Moderate persistent asthma without complication    followed by dr shaunna. padgett ([facility])   PMB (postmenopausal bleeding)    Seasonal and perennial allergic rhinitis    Type 2 diabetes mellitus (HCC)    followed by pcp   Wears glasses     Past Surgical History:  Procedure Laterality Date   DILATATION & CURETTAGE/HYSTEROSCOPY WITH MYOSURE N/A 10/20/2020   Procedure: DILATATION & CURETTAGE/HYSTEROSCOPY WITH MYOSURE polypectomy;  Surgeon: Cathlyn JAYSON Nikki Bobie FORBES, MD;  Location: [facility];  Service: Gynecology;  Laterality: N/A;   DILATATION & CURETTAGE/HYSTEROSCOPY WITH MYOSURE N/A 03/29/2022   Procedure: DILATATION & CURETTAGE/HYSTEROSCOPY WITH MYOSURE;  Surgeon: Cathlyn JAYSON Nikki Bobie FORBES, MD;  Location: [facility];  Service: Gynecology;  Laterality: N/A;   HAND SURGERY Right 2006  @[facility]    abscess   KNEE ARTHROSCOPY Left 04-27-2010   @[facility]    LAPAROSCOPY  1991   diagnostic    SHOULDER ARTHROSCOPY Right 2004    Current Outpatient Medications  Medication Sig Dispense Refill   Acetylcysteine (NAC PO) Take 900 mg by mouth 2 (two) times  daily.     Albuterol -Budesonide (AIRSUPRA ) 90-80 MCG/ACT AERO Inhale 2 puffs into the lungs every 4 (four) hours as needed (cough, wheeze, dypsnea). 10.7 g 5   ARNUITY ELLIPTA  100 MCG/ACT AEPB INHALE 1 PUFF INTO THE LUNGS DAILY 90 each 1   buPROPion (WELLBUTRIN XL) 300 MG 24 hr tablet Take 300 mg by mouth at bedtime.     calcium carbonate (TUMS - DOSED IN MG ELEMENTAL CALCIUM) 500 MG chewable tablet Chew 1 tablet by mouth as needed for indigestion or heartburn.     EPINEPHrine  0.3 mg/0.3 mL IJ SOAJ injection Inject 0.3 mg into the muscle as needed. 2 each 1   famotidine (PEPCID) 40 MG tablet Take 40 mg by mouth at bedtime.     losartan (COZAAR) 50 MG tablet Take 50 mg by mouth 2 (two) times daily.  5   MAGNESIUM  PO Take 120 mg by mouth 2 (two) times daily.     Melatonin 5 MG CAPS Take 1  capsule by mouth at bedtime as needed.     Misc Natural Products (T-RELIEF CBD+13 SL) Place 600 mg under the tongue daily. Hemp/ cbd one under tongue daily     Multiple Vitamin (MULTIVITAMIN) capsule Take 1 capsule by mouth daily.     NALTREXONE HCL PO Take 4.5 mg by mouth 2 (two) times daily.     Naltrexone HCl, Pain, (NALTREX) 4.5 MG CAPS 1 capsule.     OZEMPIC, 1 MG/DOSE, 4 MG/3ML SOPN Inject 1 mg into the skin once a week. Sunday's     Vitamin A  3 MG (10000 UT) TABS Take 1 capsule by mouth 2 (two) times a week.     albuterol  (PROVENTIL  HFA) 108 (90 Base) MCG/ACT inhaler Inhale 2 puffs into the lungs every 4 (four) hours as needed. (Patient not taking: Reported on 12/12/2023) 18 g 1   metFORMIN (GLUCOPHAGE) 1000 MG tablet  (Patient not taking: Reported on 12/12/2023)     PRESCRIPTION MEDICATION every 30 (thirty) days. Allergy  shots (Patient not taking: Reported on 12/12/2023)     Probiotic Product (PROBIOTIC DAILY) CAPS Take 1 capsule by mouth daily. (Patient not taking: Reported on 12/12/2023)     rivaroxaban  (XARELTO ) 10 MG TABS tablet Take 1 tablet (10 mg total) by mouth daily. Take 1 tablet by mouth on Friday and on Saturday prior to surgery.  Do not take the day prior to surgery and the day of surgery.  You may resume one tablet by mouth daily for 10 days, starting the day after surgery. (Patient not taking: Reported on 12/12/2023) 12 tablet 0   No current facility-administered medications for this visit.    ALLERGIES: Alpha-gal, Beef-derived drug products, Fish allergy , Lambs quarters, Other, Pork-derived products, Shellfish allergy , Bee venom, Honey, Latex, and Peanut  oil  Family History  Problem Relation Age of Onset   Arthritis Mother    Hypertension Mother    Diabetes Mother    Allergic rhinitis Neg Hx    Angioedema Neg Hx    Eczema Neg Hx    Asthma Neg Hx    Immunodeficiency Neg Hx    Urticaria Neg Hx     Review of Systems  All other systems reviewed and are  negative.   PHYSICAL EXAM:  BP 118/76 (BP Location: Left Arm, Patient Position: Sitting)   Pulse 77   Ht 5' 6.5 (1.689 m)   Wt 261 lb (118.4 kg)   LMP 06/14/2016 (Approximate)   SpO2 96%   BMI 41.50 kg/m  General appearance: alert, cooperative and appears stated age Head: normocephalic, without obvious abnormality, atraumatic Neck: no adenopathy, supple, symmetrical, trachea midline and thyroid  normal to inspection and palpation Lungs: clear to auscultation bilaterally Breasts: normal appearance, no masses or tenderness, No nipple retraction or dimpling, No nipple discharge or bleeding, No axillary adenopathy Heart: regular rate and rhythm Abdomen: soft, non-tender; no masses, no organomegaly Extremities: extremities normal, atraumatic, no cyanosis or edema Skin: skin color, texture, turgor normal. No rashes or lesions Lymph nodes: cervical, supraclavicular, and axillary nodes normal. Neurologic: grossly normal  Pelvic: External genitalia:  no lesions              No abnormal inguinal nodes palpated.              Urethra:  normal appearing urethra with no masses, tenderness or lesions              Bartholins and Skenes: normal                 Vagina: normal appearing vagina with normal color and discharge, no lesions              Cervix: no lesions.  IUD threads not seen or felt.              Pap taken: no Bimanual Exam:  Uterus:  normal size, contour, position, consistency, mobility, non-tender              Adnexa: no mass, fullness, tenderness              Rectal exam: yes.  Confirms.              Anus:  normal sphincter tone, no lesions  Chaperone was present for exam:  Kari HERO, CMA  ASSESSMENT: Well woman visit with gynecologic exam. Hx endometrial polyps.  Hx proliferative endometrium.  Mirena  IUD, placed 06/23/22.   IUD threads lost.  Hx DVT.  PHQ-2-9: 0  PLAN: Mammogram screening discussed. Self breast awareness reviewed. Pap and HRV collected:  no.  Due in  2026.  Guidelines for Calcium, Vitamin D, regular exercise program including cardiovascular and weight bearing exercise. Medication refills:  NA Labs with PCP and integrative MD Follow up:  for pelvic US  and also yearly for annual exam.

## 2023-12-21 DIAGNOSIS — E785 Hyperlipidemia, unspecified: Secondary | ICD-10-CM | POA: Diagnosis not present

## 2023-12-21 DIAGNOSIS — I10 Essential (primary) hypertension: Secondary | ICD-10-CM | POA: Diagnosis not present

## 2023-12-21 DIAGNOSIS — Z0000 Encounter for general adult medical examination without abnormal findings: Secondary | ICD-10-CM | POA: Diagnosis not present

## 2023-12-21 DIAGNOSIS — Z23 Encounter for immunization: Secondary | ICD-10-CM | POA: Diagnosis not present

## 2023-12-21 DIAGNOSIS — Z13220 Encounter for screening for lipoid disorders: Secondary | ICD-10-CM | POA: Diagnosis not present

## 2023-12-21 DIAGNOSIS — E119 Type 2 diabetes mellitus without complications: Secondary | ICD-10-CM | POA: Diagnosis not present

## 2024-01-02 ENCOUNTER — Other Ambulatory Visit (HOSPITAL_BASED_OUTPATIENT_CLINIC_OR_DEPARTMENT_OTHER): Payer: Self-pay | Admitting: Family Medicine

## 2024-01-02 DIAGNOSIS — E785 Hyperlipidemia, unspecified: Secondary | ICD-10-CM

## 2024-01-04 ENCOUNTER — Ambulatory Visit (HOSPITAL_BASED_OUTPATIENT_CLINIC_OR_DEPARTMENT_OTHER)
Admission: RE | Admit: 2024-01-04 | Discharge: 2024-01-04 | Disposition: A | Discharge status: Home / Self Care | Payer: Self-pay | Source: Ambulatory Visit | Attending: Family Medicine | Admitting: Family Medicine

## 2024-01-04 DIAGNOSIS — E785 Hyperlipidemia, unspecified: Secondary | ICD-10-CM | POA: Insufficient documentation

## 2024-01-06 DIAGNOSIS — M9905 Segmental and somatic dysfunction of pelvic region: Secondary | ICD-10-CM | POA: Diagnosis not present

## 2024-01-06 DIAGNOSIS — M9902 Segmental and somatic dysfunction of thoracic region: Secondary | ICD-10-CM | POA: Diagnosis not present

## 2024-01-06 DIAGNOSIS — M9901 Segmental and somatic dysfunction of cervical region: Secondary | ICD-10-CM | POA: Diagnosis not present

## 2024-01-06 DIAGNOSIS — M9903 Segmental and somatic dysfunction of lumbar region: Secondary | ICD-10-CM | POA: Diagnosis not present

## 2024-01-09 DIAGNOSIS — D124 Benign neoplasm of descending colon: Secondary | ICD-10-CM | POA: Diagnosis not present

## 2024-01-09 DIAGNOSIS — Z09 Encounter for follow-up examination after completed treatment for conditions other than malignant neoplasm: Secondary | ICD-10-CM | POA: Diagnosis not present

## 2024-01-09 DIAGNOSIS — K649 Unspecified hemorrhoids: Secondary | ICD-10-CM | POA: Diagnosis not present

## 2024-01-09 DIAGNOSIS — Z860101 Personal history of adenomatous and serrated colon polyps: Secondary | ICD-10-CM | POA: Diagnosis not present

## 2024-01-18 NOTE — Progress Notes (Signed)
 GYNECOLOGY  VISIT   HPI: 58 y.o.   Married  Caucasian female   [phone] with Patient's last menstrual period was 06/14/2016 (approximate).   here for: US  Consult for lost IUD threads.  GYNECOLOGIC HISTORY: Patient's last menstrual period was 06/14/2016 (approximate). Contraception:  PMP.  Mirena  placed 06/23/22.   Menopausal hormone therapy:  n/a Last 2 paps:  12/30/21 ASCUS, HR HPV neg, 07/03/20 neg HR HPV neg  History of abnormal Pap or positive HPV:  yes Mammogram:  09/08/23 Breast density Cat B, BIRADS cat 1 neg         OB History     Gravida  4   Para  4   Term  4   Preterm      AB      Living  4      SAB      IAB      Ectopic      Multiple      Live Births  4              Patient Active Problem List   Diagnosis Date Noted   Mild intermittent asthma without complication 05/18/2021   Coughing 11/24/2017   Globus sensation 11/24/2017   Laryngopharyngeal reflux (LPR) 11/24/2017   Allergic reaction 03/02/2017   Contact dermatitis due to chemicals 09/22/2016   Seasonal and perennial allergic rhinitis 09/01/2015   Mild persistent asthma 09/01/2015   Food Allergy  09/01/2015   Cystocele 11/02/2013    Past Medical History:  Diagnosis Date   Arthritis    Eczema    Endometrial mass    Environmental allergies    GERD (gastroesophageal reflux disease)    prn tums and watches diet   Hashimoto's disease    followed by dr sharyne pacini,  takes low dose naltrexone   History of DVT of lower extremity 1995   left thigh,  completed one year blood thinner   Hypertension    followed by pcp   Moderate persistent asthma without complication    followed by dr shaunna. padgett ([facility])   PMB (postmenopausal bleeding)    Seasonal and perennial allergic rhinitis    Type 2 diabetes mellitus (HCC)    followed by pcp   Wears glasses     Past Surgical History:  Procedure Laterality Date   DILATATION & CURETTAGE/HYSTEROSCOPY WITH MYOSURE N/A 10/20/2020    Procedure: DILATATION & CURETTAGE/HYSTEROSCOPY WITH MYOSURE polypectomy;  Surgeon: Cathlyn JAYSON Nikki Bobie FORBES, MD;  Location: [facility];  Service: Gynecology;  Laterality: N/A;   DILATATION & CURETTAGE/HYSTEROSCOPY WITH MYOSURE N/A 03/29/2022   Procedure: DILATATION & CURETTAGE/HYSTEROSCOPY WITH MYOSURE;  Surgeon: Cathlyn JAYSON Nikki Bobie FORBES, MD;  Location: [facility];  Service: Gynecology;  Laterality: N/A;   HAND SURGERY Right 2006  @[facility]    abscess   KNEE ARTHROSCOPY Left 04-27-2010   @[facility]    LAPAROSCOPY  1991   diagnostic    SHOULDER ARTHROSCOPY Right 2004    Current Outpatient Medications  Medication Sig Dispense Refill   Acetylcysteine (NAC PO) Take 900 mg by mouth 2 (two) times daily.     Albuterol -Budesonide (AIRSUPRA ) 90-80 MCG/ACT AERO Inhale 2 puffs into the lungs every 4 (four) hours as needed (cough, wheeze, dypsnea). 10.7 g 5   ARNUITY ELLIPTA  100 MCG/ACT AEPB INHALE 1 PUFF INTO THE LUNGS DAILY 90 each 1   buPROPion (WELLBUTRIN XL) 300 MG 24 hr tablet Take 300 mg by mouth at bedtime.  calcium carbonate (TUMS - DOSED IN MG ELEMENTAL CALCIUM) 500 MG chewable tablet Chew 1 tablet by mouth as needed for indigestion or heartburn.     EPINEPHrine  0.3 mg/0.3 mL IJ SOAJ injection Inject 0.3 mg into the muscle as needed. 2 each 1   famotidine (PEPCID) 40 MG tablet Take 40 mg by mouth at bedtime.     losartan (COZAAR) 50 MG tablet Take 50 mg by mouth 2 (two) times daily.  5   MAGNESIUM  PO Take 120 mg by mouth 2 (two) times daily.     Melatonin 5 MG CAPS Take 1 capsule by mouth at bedtime as needed.     Misc Natural Products (T-RELIEF CBD+13 SL) Place 600 mg under the tongue daily. Hemp/ cbd one under tongue daily     Multiple Vitamin (MULTIVITAMIN) capsule Take 1 capsule by mouth daily.     NALTREXONE HCL PO Take 4.5 mg by mouth 2 (two) times daily.     Naltrexone HCl, Pain, (NALTREX) 4.5 MG CAPS 1 capsule.     OZEMPIC, 1 MG/DOSE, 4 MG/3ML SOPN Inject  1 mg into the skin once a week. Sunday's     Vitamin A  3 MG (10000 UT) TABS Take 1 capsule by mouth 2 (two) times a week.     albuterol  (PROVENTIL  HFA) 108 (90 Base) MCG/ACT inhaler Inhale 2 puffs into the lungs every 4 (four) hours as needed. (Patient not taking: Reported on 01/24/2024) 18 g 1   metFORMIN (GLUCOPHAGE) 1000 MG tablet  (Patient not taking: Reported on 01/24/2024)     PRESCRIPTION MEDICATION every 30 (thirty) days. Allergy  shots (Patient not taking: Reported on 01/24/2024)     Probiotic Product (PROBIOTIC DAILY) CAPS Take 1 capsule by mouth daily. (Patient not taking: Reported on 01/24/2024)     rivaroxaban  (XARELTO ) 10 MG TABS tablet Take 1 tablet (10 mg total) by mouth daily. Take 1 tablet by mouth on Friday and on Saturday prior to surgery.  Do not take the day prior to surgery and the day of surgery.  You may resume one tablet by mouth daily for 10 days, starting the day after surgery. (Patient not taking: Reported on 01/24/2024) 12 tablet 0   No current facility-administered medications for this visit.     ALLERGIES: Alpha-gal, Beef-derived drug products, Fish allergy , Lambs quarters, Other, Pork-derived products, Shellfish allergy , Bee venom, Honey, Latex, and Peanut  oil  Family History  Problem Relation Age of Onset   Arthritis Mother    Hypertension Mother    Diabetes Mother    Allergic rhinitis Neg Hx    Angioedema Neg Hx    Eczema Neg Hx    Asthma Neg Hx    Immunodeficiency Neg Hx    Urticaria Neg Hx     Social History   Socioeconomic History   Marital status: Married    Spouse name: Not on file   Number of children: Not on file   Years of education: Not on file   Highest education level: Not on file  Occupational History   Not on file  Tobacco Use   Smoking status: Never   Smokeless tobacco: Never  Vaping Use   Vaping status: Never Used  Substance and Sexual Activity   Alcohol use: Yes    Comment: very rare   Drug use: Never   Sexual activity: Yes     Partners: Male    Birth control/protection: Post-menopausal  Other Topics Concern   Not on file  Social History Narrative  Not on file   Social Drivers of Health   Financial Resource Strain: Not on file  Food Insecurity: Not on file  Transportation Needs: Not on file  Physical Activity: Not on file  Stress: Not on file  Social Connections: Not on file  Intimate Partner Violence: Not on file    Review of Systems  All other systems reviewed and are negative.   PHYSICAL EXAMINATION:   BP 122/78 (BP Location: Left Arm, Patient Position: Sitting)   Pulse 87   LMP 06/14/2016 (Approximate)   SpO2 95%     General appearance: alert, cooperative and appears stated age   Pelvic US  Uterus 9.02 x 5.32 x 3.84 cm.  EMS 4.0 mm. IUD noted centrally in the endometrial canal.  Bilateral ovaries not seen.  No adnexal masses.  No free fluid.  ASSESSMENT:  Lost IUD threads.  Mirena  IUD check up.  Patient is using the IUD to treat postmenopausal bleeding, hx endometrial polyps, and proliferative endometrium following completion of her work up.    PLAN:  Pelvic US  images and report reviewed.   Reassurance given regarding the normal IUD position.  I recommended continued use of the IUD.  Follow up for annual exam and prn.   10  total time was spent for this patient encounter, including preparation, face-to-face counseling with the patient, coordination of care, and documentation of the encounter.

## 2024-01-24 ENCOUNTER — Other Ambulatory Visit: Payer: Self-pay | Admitting: Obstetrics and Gynecology

## 2024-01-24 ENCOUNTER — Ambulatory Visit (INDEPENDENT_AMBULATORY_CARE_PROVIDER_SITE_OTHER): Admitting: Obstetrics and Gynecology

## 2024-01-24 ENCOUNTER — Encounter: Payer: Self-pay | Admitting: Obstetrics and Gynecology

## 2024-01-24 ENCOUNTER — Ambulatory Visit (INDEPENDENT_AMBULATORY_CARE_PROVIDER_SITE_OTHER)

## 2024-01-24 VITALS — BP 122/78 | HR 87

## 2024-01-24 DIAGNOSIS — Z30431 Encounter for routine checking of intrauterine contraceptive device: Secondary | ICD-10-CM | POA: Diagnosis not present

## 2024-01-24 DIAGNOSIS — T8332XA Displacement of intrauterine contraceptive device, initial encounter: Secondary | ICD-10-CM

## 2024-01-24 DIAGNOSIS — T8332XD Displacement of intrauterine contraceptive device, subsequent encounter: Secondary | ICD-10-CM | POA: Diagnosis not present

## 2024-02-10 DIAGNOSIS — M9902 Segmental and somatic dysfunction of thoracic region: Secondary | ICD-10-CM | POA: Diagnosis not present

## 2024-02-10 DIAGNOSIS — M9905 Segmental and somatic dysfunction of pelvic region: Secondary | ICD-10-CM | POA: Diagnosis not present

## 2024-02-10 DIAGNOSIS — M9901 Segmental and somatic dysfunction of cervical region: Secondary | ICD-10-CM | POA: Diagnosis not present

## 2024-02-10 DIAGNOSIS — M9903 Segmental and somatic dysfunction of lumbar region: Secondary | ICD-10-CM | POA: Diagnosis not present

## 2024-02-16 DIAGNOSIS — E782 Mixed hyperlipidemia: Secondary | ICD-10-CM | POA: Diagnosis not present

## 2024-02-16 DIAGNOSIS — R799 Abnormal finding of blood chemistry, unspecified: Secondary | ICD-10-CM | POA: Diagnosis not present

## 2024-02-16 DIAGNOSIS — R7301 Impaired fasting glucose: Secondary | ICD-10-CM | POA: Diagnosis not present

## 2024-02-16 DIAGNOSIS — M6281 Muscle weakness (generalized): Secondary | ICD-10-CM | POA: Diagnosis not present

## 2024-02-16 DIAGNOSIS — E559 Vitamin D deficiency, unspecified: Secondary | ICD-10-CM | POA: Diagnosis not present

## 2024-02-16 DIAGNOSIS — E039 Hypothyroidism, unspecified: Secondary | ICD-10-CM | POA: Diagnosis not present

## 2024-02-16 DIAGNOSIS — E063 Autoimmune thyroiditis: Secondary | ICD-10-CM | POA: Diagnosis not present

## 2024-02-16 DIAGNOSIS — R5383 Other fatigue: Secondary | ICD-10-CM | POA: Diagnosis not present

## 2024-02-16 DIAGNOSIS — D6489 Other specified anemias: Secondary | ICD-10-CM | POA: Diagnosis not present

## 2024-03-09 DIAGNOSIS — M9901 Segmental and somatic dysfunction of cervical region: Secondary | ICD-10-CM | POA: Diagnosis not present

## 2024-03-09 DIAGNOSIS — M9905 Segmental and somatic dysfunction of pelvic region: Secondary | ICD-10-CM | POA: Diagnosis not present

## 2024-03-09 DIAGNOSIS — M9903 Segmental and somatic dysfunction of lumbar region: Secondary | ICD-10-CM | POA: Diagnosis not present

## 2024-03-09 DIAGNOSIS — M9902 Segmental and somatic dysfunction of thoracic region: Secondary | ICD-10-CM | POA: Diagnosis not present

## 2024-03-13 DIAGNOSIS — L718 Other rosacea: Secondary | ICD-10-CM | POA: Diagnosis not present

## 2024-03-19 DIAGNOSIS — M25551 Pain in right hip: Secondary | ICD-10-CM | POA: Diagnosis not present

## 2024-03-19 DIAGNOSIS — M25561 Pain in right knee: Secondary | ICD-10-CM | POA: Diagnosis not present

## 2024-03-23 DIAGNOSIS — M9905 Segmental and somatic dysfunction of pelvic region: Secondary | ICD-10-CM | POA: Diagnosis not present

## 2024-03-23 DIAGNOSIS — M9903 Segmental and somatic dysfunction of lumbar region: Secondary | ICD-10-CM | POA: Diagnosis not present

## 2024-03-23 DIAGNOSIS — M9902 Segmental and somatic dysfunction of thoracic region: Secondary | ICD-10-CM | POA: Diagnosis not present

## 2024-03-23 DIAGNOSIS — M9901 Segmental and somatic dysfunction of cervical region: Secondary | ICD-10-CM | POA: Diagnosis not present

## 2024-03-27 DIAGNOSIS — M25561 Pain in right knee: Secondary | ICD-10-CM | POA: Diagnosis not present

## 2024-03-30 DIAGNOSIS — M9903 Segmental and somatic dysfunction of lumbar region: Secondary | ICD-10-CM | POA: Diagnosis not present

## 2024-03-30 DIAGNOSIS — M9901 Segmental and somatic dysfunction of cervical region: Secondary | ICD-10-CM | POA: Diagnosis not present

## 2024-03-30 DIAGNOSIS — M9905 Segmental and somatic dysfunction of pelvic region: Secondary | ICD-10-CM | POA: Diagnosis not present

## 2024-03-30 DIAGNOSIS — M9902 Segmental and somatic dysfunction of thoracic region: Secondary | ICD-10-CM | POA: Diagnosis not present

## 2024-04-06 DIAGNOSIS — M9902 Segmental and somatic dysfunction of thoracic region: Secondary | ICD-10-CM | POA: Diagnosis not present

## 2024-04-06 DIAGNOSIS — M9905 Segmental and somatic dysfunction of pelvic region: Secondary | ICD-10-CM | POA: Diagnosis not present

## 2024-04-06 DIAGNOSIS — M9903 Segmental and somatic dysfunction of lumbar region: Secondary | ICD-10-CM | POA: Diagnosis not present

## 2024-04-06 DIAGNOSIS — M9901 Segmental and somatic dysfunction of cervical region: Secondary | ICD-10-CM | POA: Diagnosis not present

## 2024-04-13 DIAGNOSIS — M9901 Segmental and somatic dysfunction of cervical region: Secondary | ICD-10-CM | POA: Diagnosis not present

## 2024-04-13 DIAGNOSIS — M9903 Segmental and somatic dysfunction of lumbar region: Secondary | ICD-10-CM | POA: Diagnosis not present

## 2024-04-13 DIAGNOSIS — M9902 Segmental and somatic dysfunction of thoracic region: Secondary | ICD-10-CM | POA: Diagnosis not present

## 2024-04-13 DIAGNOSIS — M9905 Segmental and somatic dysfunction of pelvic region: Secondary | ICD-10-CM | POA: Diagnosis not present

## 2024-04-19 DIAGNOSIS — M9903 Segmental and somatic dysfunction of lumbar region: Secondary | ICD-10-CM | POA: Diagnosis not present

## 2024-04-19 DIAGNOSIS — M9905 Segmental and somatic dysfunction of pelvic region: Secondary | ICD-10-CM | POA: Diagnosis not present

## 2024-04-19 DIAGNOSIS — M9902 Segmental and somatic dysfunction of thoracic region: Secondary | ICD-10-CM | POA: Diagnosis not present

## 2024-04-19 DIAGNOSIS — M9901 Segmental and somatic dysfunction of cervical region: Secondary | ICD-10-CM | POA: Diagnosis not present

## 2024-04-20 DIAGNOSIS — E119 Type 2 diabetes mellitus without complications: Secondary | ICD-10-CM | POA: Diagnosis not present

## 2024-04-20 DIAGNOSIS — I10 Essential (primary) hypertension: Secondary | ICD-10-CM | POA: Diagnosis not present

## 2024-04-20 DIAGNOSIS — Z01818 Encounter for other preprocedural examination: Secondary | ICD-10-CM | POA: Diagnosis not present

## 2024-04-20 DIAGNOSIS — M179 Osteoarthritis of knee, unspecified: Secondary | ICD-10-CM | POA: Diagnosis not present

## 2024-04-20 DIAGNOSIS — Z6841 Body Mass Index (BMI) 40.0 and over, adult: Secondary | ICD-10-CM | POA: Diagnosis not present

## 2024-05-04 DIAGNOSIS — M9903 Segmental and somatic dysfunction of lumbar region: Secondary | ICD-10-CM | POA: Diagnosis not present

## 2024-05-04 DIAGNOSIS — M9901 Segmental and somatic dysfunction of cervical region: Secondary | ICD-10-CM | POA: Diagnosis not present

## 2024-05-04 DIAGNOSIS — M9905 Segmental and somatic dysfunction of pelvic region: Secondary | ICD-10-CM | POA: Diagnosis not present

## 2024-05-04 DIAGNOSIS — M9902 Segmental and somatic dysfunction of thoracic region: Secondary | ICD-10-CM | POA: Diagnosis not present

## 2024-05-25 DIAGNOSIS — M9905 Segmental and somatic dysfunction of pelvic region: Secondary | ICD-10-CM | POA: Diagnosis not present

## 2024-05-25 DIAGNOSIS — M9901 Segmental and somatic dysfunction of cervical region: Secondary | ICD-10-CM | POA: Diagnosis not present

## 2024-05-25 DIAGNOSIS — M9903 Segmental and somatic dysfunction of lumbar region: Secondary | ICD-10-CM | POA: Diagnosis not present

## 2024-05-25 DIAGNOSIS — M9902 Segmental and somatic dysfunction of thoracic region: Secondary | ICD-10-CM | POA: Diagnosis not present

## 2024-05-28 DIAGNOSIS — M9902 Segmental and somatic dysfunction of thoracic region: Secondary | ICD-10-CM | POA: Diagnosis not present

## 2024-05-28 DIAGNOSIS — M9903 Segmental and somatic dysfunction of lumbar region: Secondary | ICD-10-CM | POA: Diagnosis not present

## 2024-05-28 DIAGNOSIS — M9901 Segmental and somatic dysfunction of cervical region: Secondary | ICD-10-CM | POA: Diagnosis not present

## 2024-05-28 DIAGNOSIS — M9905 Segmental and somatic dysfunction of pelvic region: Secondary | ICD-10-CM | POA: Diagnosis not present

## 2024-05-31 ENCOUNTER — Ambulatory Visit: Payer: Self-pay | Admitting: Orthopedic Surgery

## 2024-06-01 DIAGNOSIS — M9903 Segmental and somatic dysfunction of lumbar region: Secondary | ICD-10-CM | POA: Diagnosis not present

## 2024-06-01 DIAGNOSIS — M9902 Segmental and somatic dysfunction of thoracic region: Secondary | ICD-10-CM | POA: Diagnosis not present

## 2024-06-01 DIAGNOSIS — M9905 Segmental and somatic dysfunction of pelvic region: Secondary | ICD-10-CM | POA: Diagnosis not present

## 2024-06-01 DIAGNOSIS — M9901 Segmental and somatic dysfunction of cervical region: Secondary | ICD-10-CM | POA: Diagnosis not present

## 2024-06-11 DIAGNOSIS — M9903 Segmental and somatic dysfunction of lumbar region: Secondary | ICD-10-CM | POA: Diagnosis not present

## 2024-06-11 DIAGNOSIS — M9905 Segmental and somatic dysfunction of pelvic region: Secondary | ICD-10-CM | POA: Diagnosis not present

## 2024-06-11 DIAGNOSIS — M9901 Segmental and somatic dysfunction of cervical region: Secondary | ICD-10-CM | POA: Diagnosis not present

## 2024-06-11 DIAGNOSIS — M9902 Segmental and somatic dysfunction of thoracic region: Secondary | ICD-10-CM | POA: Diagnosis not present

## 2024-07-18 NOTE — Patient Instructions (Addendum)
 SURGICAL WAITING ROOM VISITATION Patients having surgery or a procedure may have no more than 2 support people in the waiting area - these visitors may rotate.    Children under the age of 40 will not be allowed to visit due to the increase in respiratory illness  Children under the age of 76 must have an adult with them who is not the patient.  If the patient needs to stay at the hospital during part of their recovery, the visitor guidelines for inpatient rooms apply. Pre-op nurse will coordinate an appropriate time for 1 support person to accompany patient in pre-op.  This support person may not rotate.    Please refer to the [facility] website for the visitor guidelines for Inpatients (after your surgery is over and you are in a regular room).       Your procedure is scheduled on: 07-25-24   Report to [facility] Main Entrance    Report to admitting at 6:15 AM   Call this number if you have problems the morning of surgery [phone]   Do not eat food :After Midnight.   After Midnight you may have the following liquids until 5:30 AM DAY OF SURGERY  Water Non-Citrus Juices (without pulp, NO RED-Apple, White grape, White cranberry) Black Coffee (NO MILK/CREAM OR CREAMERS, sugar ok)  Clear Tea (NO MILK/CREAM OR CREAMERS, sugar ok) regular and decaf                             Plain Jell-O (NO RED)                                           Fruit ices (not with fruit pulp, NO RED)                                     Popsicles (NO RED)                                                               Sports drinks like Gatorade (NO RED)                   The day of surgery:  Drink ONE (1) Pre-Surgery  G2 by 5:30 AM the morning of surgery. Drink in one sitting. Do not sip.  This drink was given to you during your hospital  pre-op appointment visit. Nothing else to drink after completing the Pre-Surgery  G2.          If you have questions, please contact your surgeons  office.   FOLLOW  ANY ADDITIONAL PRE OP INSTRUCTIONS YOU RECEIVED FROM YOUR SURGEON'S OFFICE!!!     Oral Hygiene is also important to reduce your risk of infection.                                    Remember - BRUSH YOUR TEETH THE MORNING OF SURGERY WITH YOUR REGULAR TOOTHPASTE   Do NOT smoke after Midnight  Take these medicines the morning of surgery with A SIP OF WATER:    Naltrexone  Stop all vitamins and herbal supplements 7 days before surgery  Hold Tirzepatide 7 days before surgery (do not take after 07-17-24)  Bring CPAP mask and tubing day of surgery.                              You may not have any metal on your body including hair pins, jewelry, and body piercing             Do not wear make-up, lotions, powders, perfumes or deodorant  Do not wear nail polish including gel and S&S, artificial/acrylic nails, or any other type of covering on natural nails including finger and toenails. If you have artificial nails, gel coating, etc. that needs to be removed by a nail salon please have this removed prior to surgery or surgery may need to be canceled/ delayed if the surgeon/ anesthesia feels like they are unable to be safely monitored.   Do not shave  48 hours prior to surgery.            Do not bring valuables to the hospital. [facility] IS NOT RESPONSIBLE   FOR VALUABLES.   Contacts, dentures or bridgework may not be worn into surgery.   Bring small overnight bag day of surgery.   DO NOT BRING YOUR HOME MEDICATIONS TO THE HOSPITAL. PHARMACY WILL DISPENSE MEDICATIONS LISTED ON YOUR MEDICATION LIST TO YOU DURING YOUR ADMISSION IN THE HOSPITAL!     Special Instructions: Bring a copy of your healthcare power of attorney and living will documents the day of surgery if you haven't scanned them before.              Please read over the following fact sheets you were given: IF YOU HAVE QUESTIONS ABOUT YOUR PRE-OP INSTRUCTIONS PLEASE CALL [phone] Gwen  If you received a  COVID test during your pre-op visit  it is requested that you wear a mask when out in public, stay away from anyone that may not be feeling well and notify your surgeon if you develop symptoms. If you test positive for Covid or have been in contact with anyone that has tested positive in the last 10 days please notify you surgeon.   Pre-operative 4 CHG Bath Instructions  DYNA-Hex 4 Chlorhexidine Gluconate 4% Solution Antiseptic 4 fl. oz   You can play a key role in reducing the risk of infection after surgery. Your skin needs to be as free of germs as possible. You can reduce the number of germs on your skin by washing with CHG (chlorhexidine gluconate) soap before surgery. CHG is an antiseptic soap that kills germs and continues to kill germs even after washing.   DO NOT use if you have an allergy  to chlorhexidine/CHG or antibacterial soaps. If your skin becomes reddened or irritated, stop using the CHG and notify one of our RNs at   Please shower with the CHG soap starting 4 days before surgery using the following schedule:     Please keep in mind the following:  DO NOT shave, including legs and underarms, starting the day of your first shower.   You may shave your face at any point before/day of surgery.  Place clean sheets on your bed the day you start using CHG soap. Use a clean washcloth (not used since being washed) for each shower. DO NOT  sleep with pets once you start using the CHG.  CHG Shower Instructions:  If you choose to wash your hair and private area, wash first with your normal shampoo/soap.  After you use shampoo/soap, rinse your hair and body thoroughly to remove shampoo/soap residue.  Turn the water OFF and apply about 3 tablespoons (45 ml) of CHG soap to a CLEAN washcloth.  Apply CHG soap ONLY FROM YOUR NECK DOWN TO YOUR TOES (washing for 3-5 minutes)  DO NOT use CHG soap on face, private areas, open wounds, or sores.  Pay special attention to the area where your  surgery is being performed.  If you are having back surgery, having someone wash your back for you may be helpful. Wait 2 minutes after CHG soap is applied, then you may rinse off the CHG soap.  Pat dry with a clean towel  Put on clean clothes/pajamas   If you choose to wear lotion, please use ONLY the CHG-compatible lotions on the back of this paper.     Additional instructions for the day of surgery:  Shower with regular soap the day of surgery DO NOT APPLY any lotions, deodorants, cologne, or perfumes.   Put on clean/comfortable clothes.  Brush your teeth.  Ask your nurse before applying any prescription medications to the skin.   CHG Compatible Lotions   Aveeno Moisturizing lotion  Cetaphil Moisturizing Cream  Cetaphil Moisturizing Lotion  Clairol Herbal Essence Moisturizing Lotion, Dry Skin  Clairol Herbal Essence Moisturizing Lotion, Extra Dry Skin  Clairol Herbal Essence Moisturizing Lotion, Normal Skin  Curel Age Defying Therapeutic Moisturizing Lotion with Alpha Hydroxy  Curel Extreme Care Body Lotion  Curel Soothing Hands Moisturizing Hand Lotion  Curel Therapeutic Moisturizing Cream, Fragrance-Free  Curel Therapeutic Moisturizing Lotion, Fragrance-Free  Curel Therapeutic Moisturizing Lotion, Original Formula  Eucerin Daily Replenishing Lotion  Eucerin Dry Skin Therapy Plus Alpha Hydroxy Crme  Eucerin Dry Skin Therapy Plus Alpha Hydroxy Lotion  Eucerin Original Crme  Eucerin Original Lotion  Eucerin Plus Crme Eucerin Plus Lotion  Eucerin TriLipid Replenishing Lotion  Keri Anti-Bacterial Hand Lotion  Keri Deep Conditioning Original Lotion Dry Skin Formula Softly Scented  Keri Deep Conditioning Original Lotion, Fragrance Free Sensitive Skin Formula  Keri Lotion Fast Absorbing Fragrance Free Sensitive Skin Formula  Keri Lotion Fast Absorbing Softly Scented Dry Skin Formula  Keri Original Lotion  Keri Skin Renewal Lotion Keri Silky Smooth Lotion  Keri Silky Smooth  Sensitive Skin Lotion  Nivea Body Creamy Conditioning Oil  Nivea Body Extra Enriched Lotion  Nivea Body Original Lotion  Nivea Body Sheer Moisturizing Lotion Nivea Crme  Nivea Skin Firming Lotion  NutraDerm 30 Skin Lotion  NutraDerm Skin Lotion  NutraDerm Therapeutic Skin Cream  NutraDerm Therapeutic Skin Lotion  ProShield Protective Hand Cream  Provon moisturizing lotion   PATIENT SIGNATURE_________________________________  NURSE SIGNATURE__________________________________  ________________________________________________________________________    Monica Good  An incentive spirometer is a tool that can help keep your lungs clear and active. This tool measures how well you are filling your lungs with each breath. Taking long deep breaths may help reverse or decrease the chance of developing breathing (pulmonary) problems (especially infection) following: A long period of time when you are unable to move or be active. BEFORE THE PROCEDURE  If the spirometer includes an indicator to show your best effort, your nurse or respiratory therapist will set it to a desired goal. If possible, sit up straight or lean slightly forward. Try not to slouch. Hold the incentive spirometer in an  upright position. INSTRUCTIONS FOR USE  Sit on the edge of your bed if possible, or sit up as far as you can in bed or on a chair. Hold the incentive spirometer in an upright position. Breathe out normally. Place the mouthpiece in your mouth and seal your lips tightly around it. Breathe in slowly and as deeply as possible, raising the piston or the ball toward the top of the column. Hold your breath for 3-5 seconds or for as long as possible. Allow the piston or ball to fall to the bottom of the column. Remove the mouthpiece from your mouth and breathe out normally. Rest for a few seconds and repeat Steps 1 through 7 at least 10 times every 1-2 hours when you are awake. Take your time and take a  few normal breaths between deep breaths. The spirometer may include an indicator to show your best effort. Use the indicator as a goal to work toward during each repetition. After each set of 10 deep breaths, practice coughing to be sure your lungs are clear. If you have an incision (the cut made at the time of surgery), support your incision when coughing by placing a pillow or rolled up towels firmly against it. Once you are able to get out of bed, walk around indoors and cough well. You may stop using the incentive spirometer when instructed by your caregiver.  RISKS AND COMPLICATIONS Take your time so you do not get dizzy or light-headed. If you are in pain, you may need to take or ask for pain medication before doing incentive spirometry. It is harder to take a deep breath if you are having pain. AFTER USE Rest and breathe slowly and easily. It can be helpful to keep track of a log of your progress. Your caregiver can provide you with a simple table to help with this. If you are using the spirometer at home, follow these instructions: SEEK MEDICAL CARE IF:  You are having difficultly using the spirometer. You have trouble using the spirometer as often as instructed. Your pain medication is not giving enough relief while using the spirometer. You develop fever of 100.5 F (38.1 C) or higher. SEEK IMMEDIATE MEDICAL CARE IF:  You cough up bloody sputum that had not been present before. You develop fever of 102 F (38.9 C) or greater. You develop worsening pain at or near the incision site. MAKE SURE YOU:  Understand these instructions. Will watch your condition. Will get help right away if you are not doing well or get worse. Document Released: 10/11/2006 Document Revised: 08/23/2011 Document Reviewed: 12/12/2006 [facility] Patient Information 2014 [place], [place].

## 2024-07-18 NOTE — Progress Notes (Addendum)
 Date of COVID positive in last 90 days:  PCP - Alfrieda Pillar, DO Cardiologist -   Chest x-ray - N/A EKG - N/A Stress Test - N/A ECHO - N/A Cardiac Cath - N/A Cardiac CT - 01-04-24 Epic Pacemaker/ICD device last checked:N/A Spinal Cord Stimulator:N/A  Bowel Prep - N/A  Sleep Study - N/A CPAP -   Fasting Blood Sugar - N/A Checks Blood Sugar _____ times a day  Tirzepatide Last dose of GLP1 agonist-  N/A GLP1 instructions:  Do not take after  07-17-24   Last dose of SGLT-2 inhibitors-  N/A SGLT-2 instructions:  Do not take after     Blood Thinner Instructions: N/A Last dose:   Time: Aspirin Instructions:N/A Last Dose:  Activity level:  Can go up a flight of stairs and perform activities of daily living without stopping and without symptoms of chest pain or shortness of breath.  Able to exercise without symptoms  Unable to go up a flight of stairs without symptoms of     Anesthesia review: N/A  Patient denies shortness of breath, fever, cough and chest pain at PAT appointment  Patient verbalized understanding of instructions that were given to them at the PAT appointment. Patient was also instructed that they will need to review over the PAT instructions again at home before surgery.

## 2024-07-19 ENCOUNTER — Encounter (HOSPITAL_COMMUNITY): Payer: Self-pay

## 2024-07-19 ENCOUNTER — Encounter (HOSPITAL_COMMUNITY)
Admission: RE | Admit: 2024-07-19 | Discharge: 2024-07-19 | Disposition: A | Source: Ambulatory Visit | Attending: Specialist | Admitting: Specialist

## 2024-07-19 ENCOUNTER — Other Ambulatory Visit: Payer: Self-pay

## 2024-07-19 VITALS — BP 143/79 | HR 72 | Temp 98.3°F | Resp 18 | Ht 67.0 in | Wt 252.0 lb

## 2024-07-19 DIAGNOSIS — Z01818 Encounter for other preprocedural examination: Secondary | ICD-10-CM

## 2024-07-19 DIAGNOSIS — I10 Essential (primary) hypertension: Secondary | ICD-10-CM

## 2024-07-19 LAB — SURGICAL PCR SCREEN
MRSA, PCR: NEGATIVE
Staphylococcus aureus: POSITIVE — AB

## 2024-07-19 LAB — CBC
HCT: 46.6 % — ABNORMAL HIGH (ref 36.0–46.0)
Hemoglobin: 15.2 g/dL — ABNORMAL HIGH (ref 12.0–15.0)
MCH: 30.8 pg (ref 26.0–34.0)
MCHC: 32.6 g/dL (ref 30.0–36.0)
MCV: 94.5 fL (ref 80.0–100.0)
Platelets: 276 10*3/uL (ref 150–400)
RBC: 4.93 MIL/uL (ref 3.87–5.11)
RDW: 12.1 % (ref 11.5–15.5)
WBC: 4.7 10*3/uL (ref 4.0–10.5)
nRBC: 0 % (ref 0.0–0.2)

## 2024-07-19 LAB — BASIC METABOLIC PANEL WITH GFR
Anion gap: 12 (ref 5–15)
BUN: 13 mg/dL (ref 6–20)
CO2: 21 mmol/L — ABNORMAL LOW (ref 22–32)
Calcium: 9.7 mg/dL (ref 8.9–10.3)
Chloride: 105 mmol/L (ref 98–111)
Creatinine, Ser: 0.82 mg/dL (ref 0.44–1.00)
GFR, Estimated: >60 mL/min
Glucose, Bld: 100 mg/dL — ABNORMAL HIGH (ref 70–99)
Potassium: 4.2 mmol/L (ref 3.5–5.1)
Sodium: 138 mmol/L (ref 135–145)

## 2024-07-19 NOTE — Progress Notes (Signed)
 PCR results sent to Dr. Duwayne for review.

## 2024-07-25 ENCOUNTER — Encounter (HOSPITAL_COMMUNITY): Admission: RE | Payer: Self-pay | Source: Home / Self Care

## 2024-07-25 ENCOUNTER — Ambulatory Visit (HOSPITAL_COMMUNITY): Admission: RE | Admit: 2024-07-25 | Admitting: Specialist

## 2024-12-17 ENCOUNTER — Ambulatory Visit: Admitting: Obstetrics and Gynecology

## 2024-12-25 ENCOUNTER — Ambulatory Visit: Admitting: Obstetrics and Gynecology
